# Patient Record
Sex: Male | Born: 1940 | Race: White | Hispanic: No | Marital: Married | Smoking: Former smoker
Health system: Southern US, Community
[De-identification: ages and names within clinical notes are randomized; demographics above are authoritative.]

## PROBLEM LIST (undated history)

## (undated) DIAGNOSIS — M7130 Other bursal cyst, unspecified site: Secondary | ICD-10-CM

## (undated) DIAGNOSIS — E039 Hypothyroidism, unspecified: Secondary | ICD-10-CM

## (undated) DIAGNOSIS — I493 Ventricular premature depolarization: Secondary | ICD-10-CM

## (undated) DIAGNOSIS — C801 Malignant (primary) neoplasm, unspecified: Secondary | ICD-10-CM

## (undated) DIAGNOSIS — J189 Pneumonia, unspecified organism: Secondary | ICD-10-CM

## (undated) DIAGNOSIS — E7800 Pure hypercholesterolemia, unspecified: Secondary | ICD-10-CM

## (undated) DIAGNOSIS — I10 Essential (primary) hypertension: Secondary | ICD-10-CM

## (undated) DIAGNOSIS — M65339 Trigger finger, unspecified middle finger: Secondary | ICD-10-CM

## (undated) DIAGNOSIS — M1990 Unspecified osteoarthritis, unspecified site: Secondary | ICD-10-CM

## (undated) HISTORY — PX: JOINT REPLACEMENT: SHX530

## (undated) HISTORY — PX: TONSILLECTOMY: SUR1361

## (undated) HISTORY — PX: OTHER SURGICAL HISTORY: SHX169

## (undated) HISTORY — DX: Hypothyroidism, unspecified: E03.9

## (undated) HISTORY — DX: Ventricular premature depolarization: I49.3

## (undated) HISTORY — DX: Essential (primary) hypertension: I10

## (undated) HISTORY — PX: MENISCUS REPAIR: SHX5179

## (undated) HISTORY — DX: Pure hypercholesterolemia, unspecified: E78.00

## (undated) HISTORY — PX: CARPAL TUNNEL RELEASE: SHX101

## (undated) HISTORY — PX: HERNIA REPAIR: SHX51

---

## 2000-10-20 ENCOUNTER — Other Ambulatory Visit: Admission: RE | Admit: 2000-10-20 | Discharge: 2000-10-20 | Payer: Self-pay | Admitting: Gastroenterology

## 2001-04-02 ENCOUNTER — Ambulatory Visit (HOSPITAL_COMMUNITY): Admission: RE | Admit: 2001-04-02 | Discharge: 2001-04-02 | Payer: Self-pay | Admitting: Gastroenterology

## 2004-03-31 ENCOUNTER — Emergency Department (HOSPITAL_COMMUNITY): Admission: EM | Admit: 2004-03-31 | Discharge: 2004-03-31 | Payer: Self-pay | Admitting: Family Medicine

## 2006-05-06 ENCOUNTER — Encounter (INDEPENDENT_AMBULATORY_CARE_PROVIDER_SITE_OTHER): Payer: Self-pay | Admitting: Specialist

## 2006-05-06 ENCOUNTER — Ambulatory Visit (HOSPITAL_COMMUNITY): Admission: RE | Admit: 2006-05-06 | Discharge: 2006-05-06 | Payer: Self-pay | Admitting: Gastroenterology

## 2007-08-04 ENCOUNTER — Encounter: Admission: RE | Admit: 2007-08-04 | Discharge: 2007-08-04 | Payer: Self-pay | Admitting: Cardiology

## 2010-03-06 ENCOUNTER — Ambulatory Visit: Payer: Self-pay | Admitting: Cardiology

## 2010-03-07 ENCOUNTER — Ambulatory Visit: Payer: Self-pay | Admitting: Cardiology

## 2010-05-19 ENCOUNTER — Encounter: Payer: Self-pay | Admitting: Cardiology

## 2010-05-19 DIAGNOSIS — I493 Ventricular premature depolarization: Secondary | ICD-10-CM | POA: Insufficient documentation

## 2010-05-19 DIAGNOSIS — I10 Essential (primary) hypertension: Secondary | ICD-10-CM | POA: Insufficient documentation

## 2010-05-19 DIAGNOSIS — E039 Hypothyroidism, unspecified: Secondary | ICD-10-CM | POA: Insufficient documentation

## 2010-05-19 DIAGNOSIS — E7800 Pure hypercholesterolemia, unspecified: Secondary | ICD-10-CM | POA: Insufficient documentation

## 2010-07-04 ENCOUNTER — Other Ambulatory Visit: Payer: Self-pay | Admitting: *Deleted

## 2010-07-05 ENCOUNTER — Ambulatory Visit (INDEPENDENT_AMBULATORY_CARE_PROVIDER_SITE_OTHER): Payer: Medicare Other | Admitting: *Deleted

## 2010-07-05 ENCOUNTER — Other Ambulatory Visit: Payer: Self-pay | Admitting: Cardiology

## 2010-07-05 DIAGNOSIS — E7800 Pure hypercholesterolemia, unspecified: Secondary | ICD-10-CM

## 2010-07-05 DIAGNOSIS — Z79899 Other long term (current) drug therapy: Secondary | ICD-10-CM

## 2010-07-06 ENCOUNTER — Encounter: Payer: Self-pay | Admitting: Cardiology

## 2010-07-06 ENCOUNTER — Ambulatory Visit (INDEPENDENT_AMBULATORY_CARE_PROVIDER_SITE_OTHER): Payer: Medicare Other | Admitting: Cardiology

## 2010-07-06 DIAGNOSIS — I10 Essential (primary) hypertension: Secondary | ICD-10-CM

## 2010-07-06 DIAGNOSIS — E7800 Pure hypercholesterolemia, unspecified: Secondary | ICD-10-CM

## 2010-07-06 DIAGNOSIS — I119 Hypertensive heart disease without heart failure: Secondary | ICD-10-CM

## 2010-07-06 DIAGNOSIS — Z79899 Other long term (current) drug therapy: Secondary | ICD-10-CM

## 2010-07-06 LAB — COMPREHENSIVE METABOLIC PANEL
Albumin: 3.8 g/dL (ref 3.5–5.2)
CO2: 27 mEq/L (ref 19–32)
Glucose, Bld: 105 mg/dL — ABNORMAL HIGH (ref 70–99)
Potassium: 4.2 mEq/L (ref 3.5–5.3)
Sodium: 137 mEq/L (ref 135–145)
Total Protein: 6.2 g/dL (ref 6.0–8.3)

## 2010-07-06 LAB — LIPID PANEL
Cholesterol: 180 mg/dL (ref 0–200)
LDL Cholesterol: 83 mg/dL (ref 0–99)
Triglycerides: 104 mg/dL (ref <150)

## 2010-07-06 NOTE — Assessment & Plan Note (Signed)
His blood pressure is remaining normal on current therapy.  Continue prudent diet and continue regular exercise.  Recheck in 4 months for followup office visit lipid panel and chemistries

## 2010-07-06 NOTE — Assessment & Plan Note (Signed)
We reviewed his blood work from yesterday which is improved even though his weight has gone up 4 pounds.  He is to continue same dose of Vytorin in work harder on weight loss.

## 2010-07-06 NOTE — Progress Notes (Signed)
HPI: This pleasant 70 year old gentleman is seen for a four-month followup office visit.  He has a history of essential hypertension and hypercholesterolemia.  [facility] also [facility]  He has felt well since last visit.  Denies chest pain or shortness of breath.  He does not have any palpitations as long as he avoids drinking too much caffeine.  He goes to the gym twice a week and works out with a Psychologist, educational for exercise.  Current Outpatient Prescriptions  Medication Sig Dispense Refill  . ALPRAZolam (XANAX) 0.5 MG tablet Take 0.5 mg by mouth as needed.        Marland Kitchen aspirin 81 MG tablet Take 81 mg by mouth daily.        Marland Kitchen atenolol (TENORMIN) 50 MG tablet Take 50 mg by mouth daily.        . Coenzyme Q10 (COQ10 PO) Take by mouth daily.        Marland Kitchen ezetimibe-simvastatin (VYTORIN) 10-40 MG per tablet Take 1 tablet by mouth at bedtime.        . fish oil-omega-3 fatty acids 1000 MG capsule Take 2 g by mouth daily. Taking liquid 1 tsp daily      . levothyroxine (SYNTHROID, LEVOTHROID) 150 MCG tablet Take 150 mcg by mouth daily.          Allergies  Allergen Reactions  . Epinephrine     Patient Active Problem List  Diagnoses  . HTN (hypertension)  . Hypercholesteremia  . Hypothyroidism  . PVC's (premature ventricular contractions)    History  Smoking status  . Former Smoker  Smokeless tobacco  . Not on file    History  Alcohol Use: Not on file    No family history on file.  Review of Systems: The patient denies any heat or cold intolerance.  No weight gain or weight loss.  The patient denies headaches or blurry vision.  There is no cough or sputum production.  The patient denies dizziness.  There is no hematuria or hematochezia.  The patient denies any muscle aches or arthritis.  The patient denies any rash.  The patient denies frequent falling or instability.  There is no history of depression or anxiety.  All other systems were reviewed and are  negative.   Physical Exam:  Vital signs as recorded.  The general appearance feels a well-developed well-nourished gentleman in no distress.Pupils equal and reactive.   Extraocular Movements are full.  There is no scleral icterus.  The mouth and pharynx are normal.  The neck is supple.  The carotids reveal no bruits.  The jugular venous pressure is normal.  The thyroid is not enlarged.  There is no lymphadenopathy.The chest is clear to percussion and auscultation. There are no rales or rhonchi. Expansion of the chest is symmetrical.The precordium is quiet.  The first heart sound is normal.  The second heart sound is physiologically split.  There is no murmur gallop rub or click.  There is no abnormal lift or heave. The rhythm is regular.The abdomen is soft and nontender. Bowel sounds are normal. The liver and spleen are not enlarged. There Are no abdominal masses. There are no bruits.The pedal pulses are good.  There is no phlebitis or edema.  There is no cyanosis or clubbing.Strength is normal and symmetrical in all extremities.  There is no lateralizing weakness.  There are no sensory deficits.  Assessment / Plan:

## 2010-08-31 NOTE — Op Note (Signed)
[facility]  Patient:    Carlos Juarez, Carlos Juarez Visit Number: [id number] MRN: [id number]          Service Type: END Location: ENDO Attending Physician:  Nelda Marseille Dictated by:   Petra Kuba, M.D. Proc. Date: 04/02/01 Admit Date:  04/02/2001 Discharge Date: 04/02/2001   CC:         Petra Kuba, M.D.  Lorenda Hatchet, M.D.   Operative Report  PROCEDURE:  Colonoscopy.  INDICATION:  This is a patient with a polyp on flexible sigmoidoscopy. Consent was signed after risk, benefits, method, and options were thoroughly discussed in the office.  MEDICINES USED:  Demerol 80 mg, Versed 8 mg.  DESCRIPTION OF PROCEDURE:  Rectal inspection was pertinent for external hemorrhoids. Digital exam was negative. The videocolonoscope was inserted and easily advanced around the colon to the cecum. This did require some abdominal pressure but no position changes. Other than an occasional left side diverticulum, no abnormalities were seen on insertion. The cecum was identified by the appendiceal orifice and the ileocecal valve and the scope was inserted shortways in the terminal ileum which was normal. Photo documentation was obtained. The scope was slowly withdrawn. Prep was adequate. We did need to wash and suction with about a liter and a half to get adequate visualization but on slow withdrawal back to the rectum, other than a sigmoid diverticulum, no abnormalities were seen. Once back in the rectum, the scope was retroflexed and pertinent for some internal hemorrhoids. We went ahead and readvanced to 40 cm and rewithdrew one more time, and again no polypoid lesion was seen. A good look was had at the sigmoid, particularly at the 25-cm area where the previous polyp was seen. The scope was reinserted shortways up the left side of the colon. Air was suctioned and scope was removed. The patient tolerated the procedure well. There was no obvious immediate  complication.  ENDOSCOPIC DIAGNOSES: 1. Internal/external hemorrhoids. 2. Left occasional sigmoid diverticula. 3. Otherwise within normal limits to the terminal ileum without signs of    polyps seen on flexible sigmoidoscopy, probably removed with the cold    biopsies at that time.  PLAN:  Yearly rectals and guaiacs per Dr. Modena Jansky. I will happy to see him back p.r.n., otherwise I recommend repeat colonoscopy in five years unless needed sooner p.r.n. Dictated by:   Petra Kuba, M.D. Attending Physician:  Nelda Marseille DD:  04/02/01 TD:  04/04/01 Job: [id number]

## 2010-08-31 NOTE — Op Note (Signed)
NAME:  Carlos Juarez, Carlos Juarez NO.:  [id number]   MEDICAL RECORD NO.:  [id number]          PATIENT TYPE:  AMB   LOCATION:  ENDO                         FACILITY:  [facility]   PHYSICIAN:  Petra Kuba, M.D.    DATE OF BIRTH:  [birth year]   DATE OF PROCEDURE:  05/06/2006  DATE OF DISCHARGE:                               OPERATIVE REPORT   PROCEDURE:  Colonoscopy.   INDICATION:  he patient with a personal history of colon polyps, due for  repeat screening.  Consent was signed after risks, benefits, methods,  options thoroughly discussed in the office on multiple occasions.   MEDICINES USED:  Fentanyl 100 mcg, Versed 8 mg.   PROCEDURE:  Rectal inspection is pertinent for external hemorrhoids,  small.  The genital exam was negative.  The video colonoscope was  inserted and easily advanced around the colon to the cecum.  This did  not require any abdominal pressure or any position changes.  No  abnormalities were seen on insertion.  Cecum was identified by the  appendiceal orifice and the ileocecal valve.  In fact, the scope was  inserted a short way into the terminal ileum, which was normal.  Photo  documentation is obtained.  The scope was slowly withdrawn.  Prep was  adequate.  There was some liquid stool that required washing and  suction.  On slow withdrawal through the colon, the cecum, ascending,  transverse, and descending were all normal.  When the scope was  withdrawn around the left side of the colon, I did see one diverticula  which was small.  Also in the distal sigmoid, a few hyperplastic  appearing polyps were seen, two of which were cold biopsied.  No other  abnormalities were seen as we slowly withdrew back to the rectum.  Made  a pull-through and retroflexion confirmed some small hemorrhoids.  The  scope was __________ short ways up the left side of the colon.  Air was  suctioned and scope removed.  The patient tolerated the procedure well.  There was no  obvious immediate complication.   ENDOSCOPIC DIAGNOSES:  1. Internal external small hemorrhoids.  2. One sigmoid diverticula.  3. Tiny hyperplastic appearing sigmoid polyp, cold biopsy.  4. Otherwise within normal limits to the terminal ileum.   PLAN:  Await pathology.  Probably recheck colon screening in 5 years.  Happy to see back p.r.n.  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  I offered him an endoscopy to see  if there were any at risk lesions, but he prefers to hold off, but happy  to reevaluate at some point in the future and leave it to him and Dr.  Dorothe Pea the risks and benefits of aspirin use.           ______________________________  Petra Kuba, M.D.    MEM/MEDQ  D:  05/06/2006  T:  05/06/2006  Job:  [id number]   cc:   Jethro Bastos, M.D.

## 2010-09-24 ENCOUNTER — Other Ambulatory Visit: Payer: Self-pay | Admitting: Cardiology

## 2010-09-24 DIAGNOSIS — E785 Hyperlipidemia, unspecified: Secondary | ICD-10-CM

## 2010-09-24 MED ORDER — EZETIMIBE-SIMVASTATIN 10-40 MG PO TABS: 1.0000 | ORAL_TABLET | Freq: Every day | ORAL | Status: DC

## 2010-09-24 NOTE — Telephone Encounter (Signed)
Called in needing a 90 day refill of Vitorin at [facility] [phone]. Please call back.

## 2010-09-24 NOTE — Telephone Encounter (Signed)
Refilled per patient request. 

## 2010-10-24 ENCOUNTER — Encounter: Payer: Self-pay | Admitting: Cardiology

## 2010-10-25 ENCOUNTER — Encounter: Payer: Self-pay | Admitting: Cardiology

## 2010-11-21 ENCOUNTER — Ambulatory Visit (INDEPENDENT_AMBULATORY_CARE_PROVIDER_SITE_OTHER): Payer: Medicare Other | Admitting: *Deleted

## 2010-11-21 ENCOUNTER — Other Ambulatory Visit: Payer: Medicare Other | Admitting: *Deleted

## 2010-11-21 DIAGNOSIS — I10 Essential (primary) hypertension: Secondary | ICD-10-CM

## 2010-11-21 DIAGNOSIS — E7800 Pure hypercholesterolemia, unspecified: Secondary | ICD-10-CM

## 2010-11-21 DIAGNOSIS — I493 Ventricular premature depolarization: Secondary | ICD-10-CM

## 2010-11-21 DIAGNOSIS — E039 Hypothyroidism, unspecified: Secondary | ICD-10-CM

## 2010-11-21 DIAGNOSIS — I4949 Other premature depolarization: Secondary | ICD-10-CM

## 2010-11-21 LAB — LIPID PANEL
HDL: 82.2 mg/dL (ref >39.00)
Total CHOL/HDL Ratio: 2
Triglycerides: 113 mg/dL (ref 0.0–149.0)
VLDL: 22.6 mg/dL (ref 0.0–40.0)

## 2010-11-21 LAB — HEPATIC FUNCTION PANEL
ALT: 27 U/L (ref 0–53)
Albumin: 3.3 g/dL — ABNORMAL LOW (ref 3.5–5.2)
Total Bilirubin: 1.8 mg/dL — ABNORMAL HIGH (ref 0.3–1.2)

## 2010-11-21 LAB — BASIC METABOLIC PANEL
GFR: 71.2 mL/min (ref >60.00)
Glucose, Bld: 110 mg/dL — ABNORMAL HIGH (ref 70–99)
Potassium: 3.9 mEq/L (ref 3.5–5.1)
Sodium: 140 mEq/L (ref 135–145)

## 2010-11-23 ENCOUNTER — Ambulatory Visit (INDEPENDENT_AMBULATORY_CARE_PROVIDER_SITE_OTHER): Payer: Medicare Other | Admitting: Cardiology

## 2010-11-23 ENCOUNTER — Encounter: Payer: Self-pay | Admitting: Cardiology

## 2010-11-23 VITALS — BP 126/80 | HR 70 | Wt 213.0 lb

## 2010-11-23 DIAGNOSIS — E7800 Pure hypercholesterolemia, unspecified: Secondary | ICD-10-CM

## 2010-11-23 DIAGNOSIS — E039 Hypothyroidism, unspecified: Secondary | ICD-10-CM

## 2010-11-23 DIAGNOSIS — I119 Hypertensive heart disease without heart failure: Secondary | ICD-10-CM

## 2010-11-23 DIAGNOSIS — I10 Essential (primary) hypertension: Secondary | ICD-10-CM

## 2010-11-23 NOTE — Assessment & Plan Note (Signed)
The patient is not having symptoms referable to his blood pressure.  No dizziness or headaches.  No syncope.

## 2010-11-23 NOTE — Assessment & Plan Note (Signed)
Despite his weight gain his lipids remain satisfactory on current therapy

## 2010-11-23 NOTE — Assessment & Plan Note (Signed)
Normally he gets his thyroid checked at the [facility] hospital but his [place] doctor has moved away and so he is asking that we check his thyroid at his next visit and this will be fine.  Clinically he is euthyroid.

## 2010-11-23 NOTE — Progress Notes (Signed)
Carlos Juarez Date of Birth:  [birth year] [facility] [address]   [address] [place], [place]  [address] [phone]           Fax   [phone]  History of Present Illness: This pleasant 70 year old gentleman is seen for a four-month followup office visit.  He has a past history of essential hypertension and hypercholesterolemia.  He also has hypothyroidism.  Since last visit he has felt well.  He has not been aware of any chest pain or shortness of breath.  He has a past history of PVCs which disappeared after he caffeine drinks.  He has not been as careful with his diet and he gained 3 pounds since last visit.  He has not been able to exercise as much because of a bad sprain of his right ankle [facility]  Current Outpatient Prescriptions  Medication Sig Dispense Refill  . ALPRAZolam (XANAX) 0.5 MG tablet Take 0.5 mg by mouth as needed.        Marland Kitchen aspirin 81 MG tablet Take 81 mg by mouth daily.        Marland Kitchen atenolol (TENORMIN) 50 MG tablet Take 50 mg by mouth daily.        . Coenzyme Q10 (COQ10 PO) Take by mouth daily.        Marland Kitchen ezetimibe-simvastatin (VYTORIN) 10-40 MG per tablet Take 1 tablet by mouth at bedtime.  90 tablet  3  . fish oil-omega-3 fatty acids 1000 MG capsule Take 2 g by mouth daily. Taking liquid 1 tsp daily      . levothyroxine (SYNTHROID, LEVOTHROID) 150 MCG tablet Take 150 mcg by mouth daily.          Allergies  Allergen Reactions  . Epinephrine     Patient Active Problem List  Diagnoses  . HTN (hypertension)  . Hypercholesteremia  . Hypothyroidism  . PVC's (premature ventricular contractions)    History  Smoking status  . Former Smoker  Smokeless tobacco  . Not on file    History  Alcohol Use: Not on file    No family history on file.  Review of Systems: Constitutional: no fever chills diaphoresis or fatigue or change in weight.  Head and neck: no hearing loss, no epistaxis, no photophobia  or visual disturbance. Respiratory: No cough, shortness of breath or wheezing. Cardiovascular: No chest pain peripheral edema, palpitations. Gastrointestinal: No abdominal distention, no abdominal pain, no change in bowel habits hematochezia or melena. Genitourinary: No dysuria, no frequency, no urgency, no nocturia. Musculoskeletal:No arthralgias, no back pain, no gait disturbance or myalgias. Neurological: No dizziness, no headaches, no numbness, no seizures, no syncope, no weakness, no tremors. Hematologic: No lymphadenopathy, no easy bruising. Psychiatric: No confusion, no hallucinations, no sleep disturbance.    Physical Exam: Filed Vitals:   11/23/10 1411  BP: 126/80  Pulse: 70  The general appearance reveals a well-developed well-nourished gentleman in no distress.The head and neck exam reveals pupils equal and reactive.  Extraocular movements are full.  There is no scleral icterus.  The mouth and pharynx are normal.  The neck is supple.  The carotids reveal no bruits.  The jugular venous pressure is normal.  The  thyroid is not enlarged.  There is no lymphadenopathy.  The chest is clear to percussion and auscultation.  There are no rales or rhonchi.  Expansion of the chest is symmetrical.  The precordium is quiet.  The first heart sound is normal.  The second heart sound is physiologically split.  There is no murmur gallop rub or click.  There is no abnormal lift or heave.  The abdomen is soft and nontender.  The bowel sounds are normal.  The liver and spleen are not enlarged.  There are no abdominal masses.  There are no abdominal bruits.  Extremities reveal good pedal pulses.The right ankle is slightly tender and slightly swollen.  There is no phlebitis or edema.  There is no cyanosis or clubbing.  Strength is normal and symmetrical in all extremities.  There is no lateralizing weakness.  There are no sensory deficits.  The skin is warm and dry.  There is no rash.     Assessment /  Plan: Work harder on weight loss and cut back further on portion size he has been eating chia seeds in his yogurt each day to help his cholesterol and he feels like it is helping.  Recheck in 4 months for a followup office visit and lab work

## 2011-03-26 ENCOUNTER — Other Ambulatory Visit (INDEPENDENT_AMBULATORY_CARE_PROVIDER_SITE_OTHER): Payer: Medicare Other | Admitting: *Deleted

## 2011-03-26 DIAGNOSIS — I119 Hypertensive heart disease without heart failure: Secondary | ICD-10-CM

## 2011-03-26 LAB — BASIC METABOLIC PANEL
Calcium: 8.5 mg/dL (ref 8.4–10.5)
GFR: 66.2 mL/min (ref >60.00)
Potassium: 3.8 mEq/L (ref 3.5–5.1)
Sodium: 138 mEq/L (ref 135–145)

## 2011-03-26 LAB — HEPATIC FUNCTION PANEL
ALT: 26 U/L (ref 0–53)
AST: 25 U/L (ref 0–37)
Albumin: 3.4 g/dL — ABNORMAL LOW (ref 3.5–5.2)
Total Bilirubin: 1.6 mg/dL — ABNORMAL HIGH (ref 0.3–1.2)
Total Protein: 6.1 g/dL (ref 6.0–8.3)

## 2011-03-26 LAB — LIPID PANEL
HDL: 74.3 mg/dL (ref >39.00)
Triglycerides: 90 mg/dL (ref 0.0–149.0)
VLDL: 18 mg/dL (ref 0.0–40.0)

## 2011-03-29 ENCOUNTER — Ambulatory Visit (INDEPENDENT_AMBULATORY_CARE_PROVIDER_SITE_OTHER): Payer: Medicare Other | Admitting: Cardiology

## 2011-03-29 ENCOUNTER — Encounter: Payer: Self-pay | Admitting: Cardiology

## 2011-03-29 VITALS — BP 118/74 | HR 60 | Ht 72.0 in | Wt 216.0 lb

## 2011-03-29 DIAGNOSIS — E039 Hypothyroidism, unspecified: Secondary | ICD-10-CM

## 2011-03-29 DIAGNOSIS — I119 Hypertensive heart disease without heart failure: Secondary | ICD-10-CM

## 2011-03-29 DIAGNOSIS — E7800 Pure hypercholesterolemia, unspecified: Secondary | ICD-10-CM

## 2011-03-29 DIAGNOSIS — I10 Essential (primary) hypertension: Secondary | ICD-10-CM

## 2011-03-29 LAB — LIPID PANEL
Cholesterol: 173 mg/dL (ref 0–200)
HDL: 81 mg/dL
LDL Cholesterol: 78 mg/dL (ref 0–99)
Total CHOL/HDL Ratio: 2
Triglycerides: 71 mg/dL (ref 0.0–149.0)
VLDL: 14.2 mg/dL (ref 0.0–40.0)

## 2011-03-29 LAB — BASIC METABOLIC PANEL WITH GFR
BUN: 19 mg/dL (ref 6–23)
CO2: 28 meq/L (ref 19–32)
Calcium: 8.9 mg/dL (ref 8.4–10.5)
Chloride: 105 meq/L (ref 96–112)
Creatinine, Ser: 1.2 mg/dL (ref 0.4–1.5)
GFR: 63.05 mL/min
Glucose, Bld: 103 mg/dL — ABNORMAL HIGH (ref 70–99)
Potassium: 4.5 meq/L (ref 3.5–5.1)
Sodium: 141 meq/L (ref 135–145)

## 2011-03-29 LAB — HEPATIC FUNCTION PANEL
ALT: 29 U/L (ref 0–53)
AST: 27 U/L (ref 0–37)
Total Protein: 6.4 g/dL (ref 6.0–8.3)

## 2011-03-29 LAB — T4, FREE: Free T4: 1.36 ng/dL (ref 0.60–1.60)

## 2011-03-29 LAB — TSH: TSH: 0.67 u[IU]/mL (ref 0.35–5.50)

## 2011-03-29 MED ORDER — ALPRAZOLAM 0.5 MG PO TABS: 0.5000 mg | ORAL_TABLET | ORAL | Status: DC | PRN

## 2011-03-29 NOTE — Assessment & Plan Note (Signed)
The patient is clinically euthyroid and we are checking TSH and free T4 today.

## 2011-03-29 NOTE — Assessment & Plan Note (Signed)
The patient is on Vytorin 10/40.  He's not having any myalgias from the statin therapy.

## 2011-03-29 NOTE — Progress Notes (Signed)
Carlos Juarez Date of Birth:  [birth year] [facility] / [facility] [address]   [address] [place], [place]  [address] [phone]           Fax   [phone]  HPI: This pleasant 70 year old gentleman is seen for a scheduled followup office visit.  He has a past history of benign PVCs and a history of essential hypertension hypothyroidism and hypercholesterolemia.  He had recent lab work which was satisfactory.  We're sending him back to the lab today for thyroid function studies.  Since last visit he's had no chest pain or shortness of breath.  He has not been aware of any PVCs.  He does try to work out with a trainer twice a week.  He has some difficulty sleeping and uses Xanax 0.5 mg at bedtime occasionally  Current Outpatient Prescriptions  Medication Sig Dispense Refill  . ALPRAZolam (XANAX) 0.5 MG tablet Take 1 tablet (0.5 mg total) by mouth as needed for anxiety.  30 tablet  5  . aspirin 81 MG tablet Take 81 mg by mouth daily.        Marland Kitchen atenolol (TENORMIN) 50 MG tablet Take 50 mg by mouth daily.        . Coenzyme Q10 (COQ10 PO) Take by mouth daily.        Marland Kitchen ezetimibe-simvastatin (VYTORIN) 10-40 MG per tablet Take 1 tablet by mouth at bedtime.  90 tablet  3  . fish oil-omega-3 fatty acids 1000 MG capsule Take 2 g by mouth daily. Taking liquid 1 tsp daily      . levothyroxine (SYNTHROID, LEVOTHROID) 150 MCG tablet Take 150 mcg by mouth daily.          Allergies  Allergen Reactions  . Epinephrine     Patient Active Problem List  Diagnoses  . HTN (hypertension)  . Hypercholesteremia  . Hypothyroidism  . PVC's (premature ventricular contractions)    History  Smoking status  . Former Smoker  Smokeless tobacco  . Not on file    History  Alcohol Use: Not on file    No family history on file.  Review of Systems: The patient denies any heat or cold intolerance.  No weight gain or weight loss.  The patient denies headaches or blurry vision.  There is  no cough or sputum production.  The patient denies dizziness.  There is no hematuria or hematochezia.  The patient denies any muscle aches or arthritis.  The patient denies any rash.  The patient denies frequent falling or instability.  There is no history of depression or anxiety.  All other systems were reviewed and are negative.   Physical Exam: Filed Vitals:   03/29/11 0949  BP: 118/74  Pulse: 60   general appearance feels a well-developed well-nourished gentleman in no distress.  He has gained 3 pounds since last visit.The head and neck exam reveals pupils equal and reactive.  Extraocular movements are full.  There is no scleral icterus.  The mouth and pharynx are normal.  The neck is supple.  The carotids reveal no bruits.  The jugular venous pressure is normal.  The  thyroid is not enlarged.  There is no lymphadenopathy.  The chest is clear to percussion and auscultation.  There are no rales or rhonchi.  Expansion of the chest is symmetrical.  The precordium is quiet.  The first heart sound is normal.  The second heart sound is physiologically split.  There is no murmur gallop rub or  click.  There is no abnormal lift or heave.  The abdomen is soft and nontender.  The bowel sounds are normal.  The liver and spleen are not enlarged.  There are no abdominal masses.  There are no abdominal bruits.  Extremities reveal good pedal pulses.  There is no phlebitis or edema.  There is no cyanosis or clubbing.  Strength is normal and symmetrical in all extremities.  There is no lateralizing weakness.  There are no sensory deficits.  The skin is warm and dry.  There is no rash.     Assessment / Plan:  Continue same medication.  Await results of thyroid function studies  drawn today.  Continue same medication and recheck in 4 months for followup office visit lipid panel hepatic function panel and basal metabolic panel

## 2011-03-29 NOTE — Assessment & Plan Note (Signed)
The patient denies any chest pain shortness of breath or headaches.  No dizzy spells or syncope.  Exercise tolerance is good.

## 2011-03-29 NOTE — Patient Instructions (Signed)
Your physician wants you to follow-up in: 4 months,You will receive a reminder letter in the mail two months in advance. If you don't receive a letter, please call our office to schedule the follow-up appointment.  Your physician recommends that you return for lab work in: TODAY and in 4 months FASTING

## 2011-04-02 ENCOUNTER — Telehealth: Payer: Self-pay | Admitting: *Deleted

## 2011-04-02 NOTE — Telephone Encounter (Signed)
Message copied by Burnell Blanks on Tue Apr 02, 2011  5:40 PM ------      Message from: Cassell Clement      Created: Fri Mar 29, 2011  7:52 PM       Thyroid function normal. Send copy to patient.

## 2011-04-02 NOTE — Telephone Encounter (Signed)
Mailed patient copy of labs

## 2011-04-03 ENCOUNTER — Other Ambulatory Visit: Payer: Self-pay | Admitting: Otolaryngology

## 2011-04-03 ENCOUNTER — Ambulatory Visit
Admission: RE | Admit: 2011-04-03 | Discharge: 2011-04-03 | Disposition: A | Discharge status: Home / Self Care | Payer: Medicare Other | Source: Ambulatory Visit | Attending: Otolaryngology | Admitting: Otolaryngology

## 2011-04-03 DIAGNOSIS — R05 Cough: Secondary | ICD-10-CM

## 2011-04-03 DIAGNOSIS — R509 Fever, unspecified: Secondary | ICD-10-CM

## 2011-05-16 DIAGNOSIS — L821 Other seborrheic keratosis: Secondary | ICD-10-CM | POA: Diagnosis not present

## 2011-05-16 DIAGNOSIS — L723 Sebaceous cyst: Secondary | ICD-10-CM | POA: Diagnosis not present

## 2011-05-16 DIAGNOSIS — Z85828 Personal history of other malignant neoplasm of skin: Secondary | ICD-10-CM | POA: Diagnosis not present

## 2011-05-22 ENCOUNTER — Other Ambulatory Visit: Payer: Self-pay | Admitting: Gastroenterology

## 2011-05-22 ENCOUNTER — Encounter: Payer: Self-pay | Admitting: Cardiology

## 2011-05-22 DIAGNOSIS — Z86010 Personal history of colonic polyps: Secondary | ICD-10-CM | POA: Diagnosis not present

## 2011-05-22 DIAGNOSIS — D131 Benign neoplasm of stomach: Secondary | ICD-10-CM | POA: Diagnosis not present

## 2011-05-22 DIAGNOSIS — K449 Diaphragmatic hernia without obstruction or gangrene: Secondary | ICD-10-CM | POA: Diagnosis not present

## 2011-05-22 DIAGNOSIS — D126 Benign neoplasm of colon, unspecified: Secondary | ICD-10-CM | POA: Diagnosis not present

## 2011-05-22 DIAGNOSIS — Z09 Encounter for follow-up examination after completed treatment for conditions other than malignant neoplasm: Secondary | ICD-10-CM | POA: Diagnosis not present

## 2011-05-22 DIAGNOSIS — K5730 Diverticulosis of large intestine without perforation or abscess without bleeding: Secondary | ICD-10-CM | POA: Diagnosis not present

## 2011-05-24 ENCOUNTER — Telehealth: Payer: Self-pay | Admitting: Cardiology

## 2011-05-24 DIAGNOSIS — E785 Hyperlipidemia, unspecified: Secondary | ICD-10-CM

## 2011-05-24 NOTE — Telephone Encounter (Signed)
New problem:  Need an order to be place for fasting lab

## 2011-05-24 NOTE — Telephone Encounter (Signed)
Orders put in system.

## 2011-06-05 ENCOUNTER — Other Ambulatory Visit: Payer: Self-pay | Admitting: Dermatology

## 2011-06-05 DIAGNOSIS — L723 Sebaceous cyst: Secondary | ICD-10-CM | POA: Diagnosis not present

## 2011-08-14 ENCOUNTER — Other Ambulatory Visit (INDEPENDENT_AMBULATORY_CARE_PROVIDER_SITE_OTHER): Payer: Medicare Other

## 2011-08-14 DIAGNOSIS — E785 Hyperlipidemia, unspecified: Secondary | ICD-10-CM | POA: Diagnosis not present

## 2011-08-14 LAB — BASIC METABOLIC PANEL
Calcium: 8.8 mg/dL (ref 8.4–10.5)
GFR: 61.8 mL/min (ref >60.00)
Sodium: 137 mEq/L (ref 135–145)

## 2011-08-14 LAB — LIPID PANEL
HDL: 92.5 mg/dL (ref >39.00)
Total CHOL/HDL Ratio: 2
VLDL: 22.4 mg/dL (ref 0.0–40.0)

## 2011-08-14 LAB — HEPATIC FUNCTION PANEL
Alkaline Phosphatase: 48 U/L (ref 39–117)
Bilirubin, Direct: 0.2 mg/dL (ref 0.0–0.3)
Total Bilirubin: 2 mg/dL — ABNORMAL HIGH (ref 0.3–1.2)

## 2011-08-14 NOTE — Progress Notes (Signed)
Quick Note:  Please make copy of labs for patient visit. ______ 

## 2011-08-20 ENCOUNTER — Encounter: Payer: Self-pay | Admitting: Cardiology

## 2011-08-20 ENCOUNTER — Ambulatory Visit (INDEPENDENT_AMBULATORY_CARE_PROVIDER_SITE_OTHER): Payer: Medicare Other | Admitting: Cardiology

## 2011-08-20 VITALS — BP 129/88 | HR 56 | Ht 72.0 in | Wt 213.8 lb

## 2011-08-20 DIAGNOSIS — E785 Hyperlipidemia, unspecified: Secondary | ICD-10-CM | POA: Diagnosis not present

## 2011-08-20 DIAGNOSIS — M47812 Spondylosis without myelopathy or radiculopathy, cervical region: Secondary | ICD-10-CM | POA: Insufficient documentation

## 2011-08-20 DIAGNOSIS — I119 Hypertensive heart disease without heart failure: Secondary | ICD-10-CM

## 2011-08-20 DIAGNOSIS — E039 Hypothyroidism, unspecified: Secondary | ICD-10-CM

## 2011-08-20 DIAGNOSIS — E7800 Pure hypercholesterolemia, unspecified: Secondary | ICD-10-CM

## 2011-08-20 DIAGNOSIS — I10 Essential (primary) hypertension: Secondary | ICD-10-CM | POA: Diagnosis not present

## 2011-08-20 MED ORDER — EZETIMIBE-SIMVASTATIN 10-40 MG PO TABS: 1.0000 | ORAL_TABLET | Freq: Every day | ORAL | Status: DC

## 2011-08-20 NOTE — Progress Notes (Signed)
Carlos Juarez Date of Birth:  1940-09-15 [facility] [address] [place], [place]  [address] [phone]  Fax   [phone]  HPI: This pleasant 71 year old gentleman is seen for a scheduled followup office visit.  He has a history of essential hypertension and a history of hypercholesterolemia.  He also has a history of hypothyroid disease.  His last visit he has been doing well.  He reports that around Christmas he had a bad case of influenza and saw Dr. Lazarus Salines and was treated with Tamiflu.  It took him several weeks to get his strength back.  He had a very poor appetite during that time and everything tasted salty to him.  Current Outpatient Prescriptions  Medication Sig Dispense Refill  . ALPRAZolam (XANAX) 0.5 MG tablet Take 1 tablet (0.5 mg total) by mouth as needed for anxiety.  30 tablet  5  . aspirin 81 MG tablet Take 81 mg by mouth daily.        Marland Kitchen atenolol (TENORMIN) 50 MG tablet Take 50 mg by mouth daily.        . Coenzyme Q10 (COQ10 PO) Take by mouth daily.        Marland Kitchen ezetimibe-simvastatin (VYTORIN) 10-40 MG per tablet Take 1 tablet by mouth at bedtime.  90 tablet  3  . fish oil-omega-3 fatty acids 1000 MG capsule Take 2 g by mouth daily. Taking liquid 1 tsp daily      . levothyroxine (SYNTHROID, LEVOTHROID) 150 MCG tablet Take 150 mcg by mouth daily.        Marland Kitchen DISCONTD: ezetimibe-simvastatin (VYTORIN) 10-40 MG per tablet Take 1 tablet by mouth at bedtime.  90 tablet  3    Allergies  Allergen Reactions  . Epinephrine     Patient Active Problem List  Diagnoses  . HTN (hypertension)  . Hypercholesteremia  . Hypothyroidism  . PVC's (premature ventricular contractions)    History  Smoking status  . Former Smoker  Smokeless tobacco  . Not on file    History  Alcohol Use: Not on file    No family history on file.  Review of Systems: The patient denies any heat or cold intolerance.  No weight gain or weight loss.  The patient denies  headaches or blurry vision.  There is no cough or sputum production.  The patient denies dizziness.  There is no hematuria or hematochezia.  The patient denies any muscle aches or arthritis.  The patient denies any rash.  The patient denies frequent falling or instability.  There is no history of depression or anxiety.  All other systems were reviewed and are negative.   Physical Exam: Filed Vitals:   08/20/11 1459  BP: 129/88  Pulse: 56   the general appearance reveals a well-developed well-nourished gentleman in no distress.The head and neck exam reveals pupils equal and reactive.  Extraocular movements are full.  There is no scleral icterus.  The mouth and pharynx are normal.  The neck is supple.  The carotids reveal no bruits.  The jugular venous pressure is normal.  The  thyroid is not enlarged.  There is no lymphadenopathy.  The chest is clear to percussion and auscultation.  There are no rales or rhonchi.  Expansion of the chest is symmetrical.  The precordium is quiet.  The first heart sound is normal.  The second heart sound is physiologically split.  There is no murmur gallop rub or click.  There is no abnormal lift or heave.  The abdomen is soft and nontender.  The bowel sounds are normal.  The liver and spleen are not enlarged.  There are no abdominal masses.  There are no abdominal bruits.  Extremities reveal good pedal pulses.  There is no phlebitis or edema.  There is no cyanosis or clubbing.  Strength is normal and symmetrical in all extremities.  There is no lateralizing weakness.  There are no sensory deficits.  The skin is warm and dry.  There is no rash.  EKG today shows sinus bradycardia and is otherwise within normal limits    Assessment / Plan: Continue same medication.  We refilled his Vytorin.  Recheck in 4 months for followup office visit and fasting lipid panel hepatic function panel and basal metabolic panel

## 2011-08-20 NOTE — Patient Instructions (Signed)
Your physician recommends that you continue on your current medications as directed. Please refer to the Current Medication list given to you today.  Your physician recommends that you schedule a follow-up appointment in: 4 months with fasting labs (lp/bmet/hfp)  

## 2011-08-20 NOTE — Assessment & Plan Note (Signed)
Patient has a history of hypertension.  He has not been having any chest pain or shortness of breath.  He exercises regularly with a trainer.  He has not been aware of any recent PVCs and he does avoid caffeine

## 2011-08-20 NOTE — Assessment & Plan Note (Signed)
Patient has a history of hypercholesterolemia.  We reviewed his recent labs which are still satisfactory although not as good as last time.  His blood sugar is also slightly elevated and he has not been as careful with his diet.  His weight however is down 3 pounds from his last visit probably reflecting his flulike illness several months ago when he lost a lot of weight

## 2011-08-20 NOTE — Assessment & Plan Note (Signed)
The patient awakens during the night with his right thumb and adjacent to fingers feeling asleep.  This never happens during the day.  It does not affect his left hand.  He feels that it is probably related to a pinched nerve in his neck.  His symptoms are not severe enough that he wants to see a neurosurgeon or a specialist at this point.  Plans also to discuss these symptoms with his chiropractor

## 2011-08-20 NOTE — Assessment & Plan Note (Signed)
Patient is clinically euthyroid on his current dose of Synthroid 150 mcg daily

## 2012-01-03 ENCOUNTER — Other Ambulatory Visit (INDEPENDENT_AMBULATORY_CARE_PROVIDER_SITE_OTHER): Payer: Medicare Other

## 2012-01-03 DIAGNOSIS — E7800 Pure hypercholesterolemia, unspecified: Secondary | ICD-10-CM | POA: Diagnosis not present

## 2012-01-03 LAB — BASIC METABOLIC PANEL
Calcium: 9.1 mg/dL (ref 8.4–10.5)
Chloride: 100 mEq/L (ref 96–112)
Creatinine, Ser: 1.2 mg/dL (ref 0.4–1.5)
GFR: 65.4 mL/min (ref >60.00)

## 2012-01-03 LAB — HEPATIC FUNCTION PANEL
ALT: 55 U/L — ABNORMAL HIGH (ref 0–53)
Albumin: 3.4 g/dL — ABNORMAL LOW (ref 3.5–5.2)
Alkaline Phosphatase: 50 U/L (ref 39–117)
Total Protein: 6.3 g/dL (ref 6.0–8.3)

## 2012-01-03 LAB — LIPID PANEL
Cholesterol: 206 mg/dL — ABNORMAL HIGH (ref 0–200)
VLDL: 26 mg/dL (ref 0.0–40.0)

## 2012-01-03 LAB — LDL CHOLESTEROL, DIRECT: Direct LDL: 103.7 mg/dL

## 2012-01-05 NOTE — Progress Notes (Signed)
Quick Note:  Please make copy of labs for patient visit. ______ 

## 2012-01-08 ENCOUNTER — Encounter: Payer: Self-pay | Admitting: Cardiology

## 2012-01-08 ENCOUNTER — Ambulatory Visit (INDEPENDENT_AMBULATORY_CARE_PROVIDER_SITE_OTHER): Payer: Medicare Other | Admitting: Cardiology

## 2012-01-08 VITALS — BP 124/80 | HR 62 | Ht 72.0 in | Wt 202.8 lb

## 2012-01-08 DIAGNOSIS — E7800 Pure hypercholesterolemia, unspecified: Secondary | ICD-10-CM

## 2012-01-08 DIAGNOSIS — E039 Hypothyroidism, unspecified: Secondary | ICD-10-CM

## 2012-01-08 DIAGNOSIS — I10 Essential (primary) hypertension: Secondary | ICD-10-CM | POA: Diagnosis not present

## 2012-01-08 NOTE — Progress Notes (Signed)
Carlos Juarez Date of Birth:  Aug 30, 1940 [facility] [address] [place], [place]  [address] [phone]  Fax   [phone]  HPI: This pleasant 71 year old gentleman is seen for a scheduled followup office visit. He has a history of essential hypertension and a history of hypercholesterolemia.  He does not have any history of ischemic heart disease.  He had a normal treadmill Cardiolite stress test on 07/24/05 which showed no ischemia and his ejection fraction was 57% with no wall motion abnormalities. He also has a history of hypothyroid disease.  Since his last visit he has been doing well.   Current Outpatient Prescriptions  Medication Sig Dispense Refill  . ALPRAZolam (XANAX) 0.5 MG tablet Take 1 tablet (0.5 mg total) by mouth as needed for anxiety.  30 tablet  5  . aspirin 81 MG tablet Take 81 mg by mouth daily.        Marland Kitchen atenolol (TENORMIN) 50 MG tablet Take 50 mg by mouth daily.        . Coenzyme Q10 (COQ10 PO) Take by mouth daily.        Marland Kitchen ezetimibe-simvastatin (VYTORIN) 10-40 MG per tablet Take 1 tablet by mouth at bedtime.  90 tablet  3  . fish oil-omega-3 fatty acids 1000 MG capsule Take 2 g by mouth daily. Taking liquid 1 tsp daily      . levothyroxine (SYNTHROID, LEVOTHROID) 150 MCG tablet Take 150 mcg by mouth daily.          Allergies  Allergen Reactions  . Epinephrine     Patient Active Problem List  Diagnosis  . HTN (hypertension)  . Hypercholesteremia  . Hypothyroidism  . PVC's (premature ventricular contractions)  . Cervical spondylosis    History  Smoking status  . Former Smoker  Smokeless tobacco  . Not on file    History  Alcohol Use: Not on file    No family history on file.  Review of Systems: The patient denies any heat or cold intolerance.  No weight gain or weight loss.  The patient denies headaches or blurry vision.  There is no cough or sputum production.  The patient denies dizziness.  There is no hematuria or  hematochezia.  The patient denies any muscle aches or arthritis.  The patient denies any rash.  The patient denies frequent falling or instability.  There is no history of depression or anxiety.  All other systems were reviewed and are negative.   Physical Exam: Filed Vitals:   01/08/12 1439  BP: 124/80  Pulse: 62   The general appearance reveals a well-developed well-nourished gentleman in no distress.The head and neck exam reveals pupils equal and reactive.  Extraocular movements are full.  There is no scleral icterus.  The mouth and pharynx are normal.  The neck is supple.  The carotids reveal no bruits.  The jugular venous pressure is normal.  The  thyroid is not enlarged.  There is no lymphadenopathy.  The chest is clear to percussion and auscultation.  There are no rales or rhonchi.  Expansion of the chest is symmetrical.  The precordium is quiet.  The first heart sound is normal.  The second heart sound is physiologically split.  There is no murmur gallop rub or click.  There is no abnormal lift or heave.  The abdomen is soft and nontender.  The bowel sounds are normal.  The liver and spleen are not enlarged.  There are no abdominal masses.  There are  no abdominal bruits.  Extremities reveal good pedal pulses.  There is no phlebitis or edema.  There is no cyanosis or clubbing.  Strength is normal and symmetrical in all extremities.  There is no lateralizing weakness.  There are no sensory deficits.  The skin is warm and dry.  There is no rash.     Assessment / Plan: Continue same medication but go back to his previous heart healthy diet.  Recheck in 4 months for followup office visit and we'll get fasting lab work as well as TSH and free T4 ahead of time

## 2012-01-08 NOTE — Patient Instructions (Addendum)
Your physician recommends that you continue on your current medications as directed. Please refer to the Current Medication list given to you today.  Your physician wants you to follow-up in: 4 months with fasting labs (lp/bmet/hfp/t4/tsh)  You will receive a reminder letter in the mail two months in advance. If you don't receive a letter, please call our office to schedule the follow-up appointment.

## 2012-01-08 NOTE — Assessment & Plan Note (Signed)
Blood pressure has been stable on current therapy.  He exercises regularly.  He has not been aware of any recent PVCs.  He drinks about one cup of coffee a day.  He drinks very little alcohol.

## 2012-01-08 NOTE — Assessment & Plan Note (Signed)
The patient has been on a new diet and has lost 11 pounds.  It is called the 2 day diet.  Unfortunately his lipid work and blood work drawn last week show a worsening of his lipid profile and also his blood sugar is higher.  It appears at this time it is not suitable for him and he will go back to his previous diet and use portion control.  Also one of his liver function studies is elevated this time possibly reflecting fatty liver.

## 2012-01-15 DIAGNOSIS — S139XXA Sprain of joints and ligaments of unspecified parts of neck, initial encounter: Secondary | ICD-10-CM | POA: Diagnosis not present

## 2012-01-15 DIAGNOSIS — M9981 Other biomechanical lesions of cervical region: Secondary | ICD-10-CM | POA: Diagnosis not present

## 2012-01-15 DIAGNOSIS — M999 Biomechanical lesion, unspecified: Secondary | ICD-10-CM | POA: Diagnosis not present

## 2012-01-15 DIAGNOSIS — S239XXA Sprain of unspecified parts of thorax, initial encounter: Secondary | ICD-10-CM | POA: Diagnosis not present

## 2012-01-15 DIAGNOSIS — S335XXA Sprain of ligaments of lumbar spine, initial encounter: Secondary | ICD-10-CM | POA: Diagnosis not present

## 2012-01-16 DIAGNOSIS — M999 Biomechanical lesion, unspecified: Secondary | ICD-10-CM | POA: Diagnosis not present

## 2012-01-16 DIAGNOSIS — M9981 Other biomechanical lesions of cervical region: Secondary | ICD-10-CM | POA: Diagnosis not present

## 2012-01-16 DIAGNOSIS — S239XXA Sprain of unspecified parts of thorax, initial encounter: Secondary | ICD-10-CM | POA: Diagnosis not present

## 2012-01-16 DIAGNOSIS — S335XXA Sprain of ligaments of lumbar spine, initial encounter: Secondary | ICD-10-CM | POA: Diagnosis not present

## 2012-01-16 DIAGNOSIS — S139XXA Sprain of joints and ligaments of unspecified parts of neck, initial encounter: Secondary | ICD-10-CM | POA: Diagnosis not present

## 2012-01-20 DIAGNOSIS — S139XXA Sprain of joints and ligaments of unspecified parts of neck, initial encounter: Secondary | ICD-10-CM | POA: Diagnosis not present

## 2012-01-20 DIAGNOSIS — S239XXA Sprain of unspecified parts of thorax, initial encounter: Secondary | ICD-10-CM | POA: Diagnosis not present

## 2012-01-20 DIAGNOSIS — M9981 Other biomechanical lesions of cervical region: Secondary | ICD-10-CM | POA: Diagnosis not present

## 2012-01-20 DIAGNOSIS — M999 Biomechanical lesion, unspecified: Secondary | ICD-10-CM | POA: Diagnosis not present

## 2012-01-20 DIAGNOSIS — S335XXA Sprain of ligaments of lumbar spine, initial encounter: Secondary | ICD-10-CM | POA: Diagnosis not present

## 2012-01-21 DIAGNOSIS — S239XXA Sprain of unspecified parts of thorax, initial encounter: Secondary | ICD-10-CM | POA: Diagnosis not present

## 2012-01-21 DIAGNOSIS — S335XXA Sprain of ligaments of lumbar spine, initial encounter: Secondary | ICD-10-CM | POA: Diagnosis not present

## 2012-01-21 DIAGNOSIS — S139XXA Sprain of joints and ligaments of unspecified parts of neck, initial encounter: Secondary | ICD-10-CM | POA: Diagnosis not present

## 2012-01-21 DIAGNOSIS — M9981 Other biomechanical lesions of cervical region: Secondary | ICD-10-CM | POA: Diagnosis not present

## 2012-01-21 DIAGNOSIS — M999 Biomechanical lesion, unspecified: Secondary | ICD-10-CM | POA: Diagnosis not present

## 2012-01-22 DIAGNOSIS — H43819 Vitreous degeneration, unspecified eye: Secondary | ICD-10-CM | POA: Diagnosis not present

## 2012-01-22 DIAGNOSIS — H259 Unspecified age-related cataract: Secondary | ICD-10-CM | POA: Diagnosis not present

## 2012-01-22 DIAGNOSIS — H524 Presbyopia: Secondary | ICD-10-CM | POA: Diagnosis not present

## 2012-01-22 DIAGNOSIS — H52209 Unspecified astigmatism, unspecified eye: Secondary | ICD-10-CM | POA: Diagnosis not present

## 2012-01-23 DIAGNOSIS — S239XXA Sprain of unspecified parts of thorax, initial encounter: Secondary | ICD-10-CM | POA: Diagnosis not present

## 2012-01-23 DIAGNOSIS — M999 Biomechanical lesion, unspecified: Secondary | ICD-10-CM | POA: Diagnosis not present

## 2012-01-23 DIAGNOSIS — S139XXA Sprain of joints and ligaments of unspecified parts of neck, initial encounter: Secondary | ICD-10-CM | POA: Diagnosis not present

## 2012-01-23 DIAGNOSIS — M9981 Other biomechanical lesions of cervical region: Secondary | ICD-10-CM | POA: Diagnosis not present

## 2012-01-23 DIAGNOSIS — S335XXA Sprain of ligaments of lumbar spine, initial encounter: Secondary | ICD-10-CM | POA: Diagnosis not present

## 2012-02-27 DIAGNOSIS — S335XXA Sprain of ligaments of lumbar spine, initial encounter: Secondary | ICD-10-CM | POA: Diagnosis not present

## 2012-02-27 DIAGNOSIS — M9981 Other biomechanical lesions of cervical region: Secondary | ICD-10-CM | POA: Diagnosis not present

## 2012-02-27 DIAGNOSIS — S239XXA Sprain of unspecified parts of thorax, initial encounter: Secondary | ICD-10-CM | POA: Diagnosis not present

## 2012-02-27 DIAGNOSIS — M999 Biomechanical lesion, unspecified: Secondary | ICD-10-CM | POA: Diagnosis not present

## 2012-02-27 DIAGNOSIS — S139XXA Sprain of joints and ligaments of unspecified parts of neck, initial encounter: Secondary | ICD-10-CM | POA: Diagnosis not present

## 2012-03-03 DIAGNOSIS — M9981 Other biomechanical lesions of cervical region: Secondary | ICD-10-CM | POA: Diagnosis not present

## 2012-03-03 DIAGNOSIS — S139XXA Sprain of joints and ligaments of unspecified parts of neck, initial encounter: Secondary | ICD-10-CM | POA: Diagnosis not present

## 2012-03-03 DIAGNOSIS — S239XXA Sprain of unspecified parts of thorax, initial encounter: Secondary | ICD-10-CM | POA: Diagnosis not present

## 2012-03-03 DIAGNOSIS — M999 Biomechanical lesion, unspecified: Secondary | ICD-10-CM | POA: Diagnosis not present

## 2012-03-03 DIAGNOSIS — S335XXA Sprain of ligaments of lumbar spine, initial encounter: Secondary | ICD-10-CM | POA: Diagnosis not present

## 2012-03-05 DIAGNOSIS — M9981 Other biomechanical lesions of cervical region: Secondary | ICD-10-CM | POA: Diagnosis not present

## 2012-03-05 DIAGNOSIS — S335XXA Sprain of ligaments of lumbar spine, initial encounter: Secondary | ICD-10-CM | POA: Diagnosis not present

## 2012-03-05 DIAGNOSIS — S139XXA Sprain of joints and ligaments of unspecified parts of neck, initial encounter: Secondary | ICD-10-CM | POA: Diagnosis not present

## 2012-03-05 DIAGNOSIS — S239XXA Sprain of unspecified parts of thorax, initial encounter: Secondary | ICD-10-CM | POA: Diagnosis not present

## 2012-03-05 DIAGNOSIS — M999 Biomechanical lesion, unspecified: Secondary | ICD-10-CM | POA: Diagnosis not present

## 2012-03-09 DIAGNOSIS — M999 Biomechanical lesion, unspecified: Secondary | ICD-10-CM | POA: Diagnosis not present

## 2012-03-09 DIAGNOSIS — M9981 Other biomechanical lesions of cervical region: Secondary | ICD-10-CM | POA: Diagnosis not present

## 2012-03-09 DIAGNOSIS — S239XXA Sprain of unspecified parts of thorax, initial encounter: Secondary | ICD-10-CM | POA: Diagnosis not present

## 2012-03-09 DIAGNOSIS — S335XXA Sprain of ligaments of lumbar spine, initial encounter: Secondary | ICD-10-CM | POA: Diagnosis not present

## 2012-03-09 DIAGNOSIS — S139XXA Sprain of joints and ligaments of unspecified parts of neck, initial encounter: Secondary | ICD-10-CM | POA: Diagnosis not present

## 2012-03-13 DIAGNOSIS — S139XXA Sprain of joints and ligaments of unspecified parts of neck, initial encounter: Secondary | ICD-10-CM | POA: Diagnosis not present

## 2012-03-13 DIAGNOSIS — M999 Biomechanical lesion, unspecified: Secondary | ICD-10-CM | POA: Diagnosis not present

## 2012-03-13 DIAGNOSIS — S239XXA Sprain of unspecified parts of thorax, initial encounter: Secondary | ICD-10-CM | POA: Diagnosis not present

## 2012-03-13 DIAGNOSIS — M9981 Other biomechanical lesions of cervical region: Secondary | ICD-10-CM | POA: Diagnosis not present

## 2012-03-13 DIAGNOSIS — S335XXA Sprain of ligaments of lumbar spine, initial encounter: Secondary | ICD-10-CM | POA: Diagnosis not present

## 2012-03-16 DIAGNOSIS — S139XXA Sprain of joints and ligaments of unspecified parts of neck, initial encounter: Secondary | ICD-10-CM | POA: Diagnosis not present

## 2012-03-16 DIAGNOSIS — S239XXA Sprain of unspecified parts of thorax, initial encounter: Secondary | ICD-10-CM | POA: Diagnosis not present

## 2012-03-16 DIAGNOSIS — M9981 Other biomechanical lesions of cervical region: Secondary | ICD-10-CM | POA: Diagnosis not present

## 2012-03-16 DIAGNOSIS — M999 Biomechanical lesion, unspecified: Secondary | ICD-10-CM | POA: Diagnosis not present

## 2012-03-16 DIAGNOSIS — S335XXA Sprain of ligaments of lumbar spine, initial encounter: Secondary | ICD-10-CM | POA: Diagnosis not present

## 2012-03-24 DIAGNOSIS — M9981 Other biomechanical lesions of cervical region: Secondary | ICD-10-CM | POA: Diagnosis not present

## 2012-03-24 DIAGNOSIS — S139XXA Sprain of joints and ligaments of unspecified parts of neck, initial encounter: Secondary | ICD-10-CM | POA: Diagnosis not present

## 2012-03-24 DIAGNOSIS — S335XXA Sprain of ligaments of lumbar spine, initial encounter: Secondary | ICD-10-CM | POA: Diagnosis not present

## 2012-03-24 DIAGNOSIS — M999 Biomechanical lesion, unspecified: Secondary | ICD-10-CM | POA: Diagnosis not present

## 2012-03-24 DIAGNOSIS — S239XXA Sprain of unspecified parts of thorax, initial encounter: Secondary | ICD-10-CM | POA: Diagnosis not present

## 2012-03-25 DIAGNOSIS — M9981 Other biomechanical lesions of cervical region: Secondary | ICD-10-CM | POA: Diagnosis not present

## 2012-03-25 DIAGNOSIS — S239XXA Sprain of unspecified parts of thorax, initial encounter: Secondary | ICD-10-CM | POA: Diagnosis not present

## 2012-03-25 DIAGNOSIS — S139XXA Sprain of joints and ligaments of unspecified parts of neck, initial encounter: Secondary | ICD-10-CM | POA: Diagnosis not present

## 2012-03-25 DIAGNOSIS — S335XXA Sprain of ligaments of lumbar spine, initial encounter: Secondary | ICD-10-CM | POA: Diagnosis not present

## 2012-03-25 DIAGNOSIS — M999 Biomechanical lesion, unspecified: Secondary | ICD-10-CM | POA: Diagnosis not present

## 2012-03-30 DIAGNOSIS — M999 Biomechanical lesion, unspecified: Secondary | ICD-10-CM | POA: Diagnosis not present

## 2012-03-30 DIAGNOSIS — S335XXA Sprain of ligaments of lumbar spine, initial encounter: Secondary | ICD-10-CM | POA: Diagnosis not present

## 2012-03-30 DIAGNOSIS — S239XXA Sprain of unspecified parts of thorax, initial encounter: Secondary | ICD-10-CM | POA: Diagnosis not present

## 2012-03-30 DIAGNOSIS — S139XXA Sprain of joints and ligaments of unspecified parts of neck, initial encounter: Secondary | ICD-10-CM | POA: Diagnosis not present

## 2012-03-30 DIAGNOSIS — M9981 Other biomechanical lesions of cervical region: Secondary | ICD-10-CM | POA: Diagnosis not present

## 2012-04-01 DIAGNOSIS — S139XXA Sprain of joints and ligaments of unspecified parts of neck, initial encounter: Secondary | ICD-10-CM | POA: Diagnosis not present

## 2012-04-01 DIAGNOSIS — S239XXA Sprain of unspecified parts of thorax, initial encounter: Secondary | ICD-10-CM | POA: Diagnosis not present

## 2012-04-01 DIAGNOSIS — M999 Biomechanical lesion, unspecified: Secondary | ICD-10-CM | POA: Diagnosis not present

## 2012-04-01 DIAGNOSIS — M9981 Other biomechanical lesions of cervical region: Secondary | ICD-10-CM | POA: Diagnosis not present

## 2012-04-01 DIAGNOSIS — S335XXA Sprain of ligaments of lumbar spine, initial encounter: Secondary | ICD-10-CM | POA: Diagnosis not present

## 2012-04-22 DIAGNOSIS — S139XXA Sprain of joints and ligaments of unspecified parts of neck, initial encounter: Secondary | ICD-10-CM | POA: Diagnosis not present

## 2012-04-22 DIAGNOSIS — S239XXA Sprain of unspecified parts of thorax, initial encounter: Secondary | ICD-10-CM | POA: Diagnosis not present

## 2012-04-22 DIAGNOSIS — M9981 Other biomechanical lesions of cervical region: Secondary | ICD-10-CM | POA: Diagnosis not present

## 2012-04-22 DIAGNOSIS — S335XXA Sprain of ligaments of lumbar spine, initial encounter: Secondary | ICD-10-CM | POA: Diagnosis not present

## 2012-04-22 DIAGNOSIS — M999 Biomechanical lesion, unspecified: Secondary | ICD-10-CM | POA: Diagnosis not present

## 2012-04-28 DIAGNOSIS — S335XXA Sprain of ligaments of lumbar spine, initial encounter: Secondary | ICD-10-CM | POA: Diagnosis not present

## 2012-04-28 DIAGNOSIS — S239XXA Sprain of unspecified parts of thorax, initial encounter: Secondary | ICD-10-CM | POA: Diagnosis not present

## 2012-04-28 DIAGNOSIS — M999 Biomechanical lesion, unspecified: Secondary | ICD-10-CM | POA: Diagnosis not present

## 2012-04-28 DIAGNOSIS — S139XXA Sprain of joints and ligaments of unspecified parts of neck, initial encounter: Secondary | ICD-10-CM | POA: Diagnosis not present

## 2012-04-28 DIAGNOSIS — M9981 Other biomechanical lesions of cervical region: Secondary | ICD-10-CM | POA: Diagnosis not present

## 2012-05-04 ENCOUNTER — Other Ambulatory Visit: Payer: Self-pay | Admitting: *Deleted

## 2012-05-04 ENCOUNTER — Other Ambulatory Visit (INDEPENDENT_AMBULATORY_CARE_PROVIDER_SITE_OTHER): Payer: Medicare Other

## 2012-05-04 DIAGNOSIS — I4949 Other premature depolarization: Secondary | ICD-10-CM

## 2012-05-04 DIAGNOSIS — E7800 Pure hypercholesterolemia, unspecified: Secondary | ICD-10-CM | POA: Diagnosis not present

## 2012-05-04 DIAGNOSIS — I493 Ventricular premature depolarization: Secondary | ICD-10-CM

## 2012-05-04 DIAGNOSIS — E039 Hypothyroidism, unspecified: Secondary | ICD-10-CM | POA: Diagnosis not present

## 2012-05-04 DIAGNOSIS — I10 Essential (primary) hypertension: Secondary | ICD-10-CM | POA: Diagnosis not present

## 2012-05-04 LAB — TSH: TSH: 0.17 u[IU]/mL — ABNORMAL LOW (ref 0.35–5.50)

## 2012-05-04 LAB — LIPID PANEL
HDL: 78.9 mg/dL (ref >39.00)
LDL Cholesterol: 80 mg/dL (ref 0–99)
Total CHOL/HDL Ratio: 2
Triglycerides: 152 mg/dL — ABNORMAL HIGH (ref 0.0–149.0)

## 2012-05-04 LAB — HEPATIC FUNCTION PANEL
Albumin: 3.3 g/dL — ABNORMAL LOW (ref 3.5–5.2)
Total Bilirubin: 2 mg/dL — ABNORMAL HIGH (ref 0.3–1.2)

## 2012-05-04 LAB — BASIC METABOLIC PANEL
CO2: 30 mEq/L (ref 19–32)
Calcium: 8.7 mg/dL (ref 8.4–10.5)
Chloride: 100 mEq/L (ref 96–112)
Glucose, Bld: 128 mg/dL — ABNORMAL HIGH (ref 70–99)
Sodium: 137 mEq/L (ref 135–145)

## 2012-05-04 LAB — T4, FREE: Free T4: 1.34 ng/dL (ref 0.60–1.60)

## 2012-05-04 NOTE — Progress Notes (Signed)
Quick Note:  Please make copy of labs for patient visit. ______ 

## 2012-05-05 DIAGNOSIS — S335XXA Sprain of ligaments of lumbar spine, initial encounter: Secondary | ICD-10-CM | POA: Diagnosis not present

## 2012-05-05 DIAGNOSIS — M9981 Other biomechanical lesions of cervical region: Secondary | ICD-10-CM | POA: Diagnosis not present

## 2012-05-05 DIAGNOSIS — S139XXA Sprain of joints and ligaments of unspecified parts of neck, initial encounter: Secondary | ICD-10-CM | POA: Diagnosis not present

## 2012-05-05 DIAGNOSIS — M999 Biomechanical lesion, unspecified: Secondary | ICD-10-CM | POA: Diagnosis not present

## 2012-05-05 DIAGNOSIS — S239XXA Sprain of unspecified parts of thorax, initial encounter: Secondary | ICD-10-CM | POA: Diagnosis not present

## 2012-05-06 DIAGNOSIS — Z85828 Personal history of other malignant neoplasm of skin: Secondary | ICD-10-CM | POA: Diagnosis not present

## 2012-05-06 DIAGNOSIS — D485 Neoplasm of uncertain behavior of skin: Secondary | ICD-10-CM | POA: Diagnosis not present

## 2012-05-06 DIAGNOSIS — L570 Actinic keratosis: Secondary | ICD-10-CM | POA: Diagnosis not present

## 2012-05-07 ENCOUNTER — Encounter: Payer: Self-pay | Admitting: Cardiology

## 2012-05-07 ENCOUNTER — Ambulatory Visit (INDEPENDENT_AMBULATORY_CARE_PROVIDER_SITE_OTHER): Payer: Medicare Other | Admitting: Cardiology

## 2012-05-07 VITALS — BP 133/70 | HR 54 | Ht 72.0 in | Wt 202.0 lb

## 2012-05-07 DIAGNOSIS — E039 Hypothyroidism, unspecified: Secondary | ICD-10-CM

## 2012-05-07 DIAGNOSIS — I4949 Other premature depolarization: Secondary | ICD-10-CM

## 2012-05-07 DIAGNOSIS — E785 Hyperlipidemia, unspecified: Secondary | ICD-10-CM

## 2012-05-07 DIAGNOSIS — E7800 Pure hypercholesterolemia, unspecified: Secondary | ICD-10-CM | POA: Diagnosis not present

## 2012-05-07 DIAGNOSIS — I10 Essential (primary) hypertension: Secondary | ICD-10-CM

## 2012-05-07 DIAGNOSIS — I493 Ventricular premature depolarization: Secondary | ICD-10-CM

## 2012-05-07 MED ORDER — EZETIMIBE-SIMVASTATIN 10-20 MG PO TABS: 1.0000 | ORAL_TABLET | Freq: Every day | ORAL | Status: DC

## 2012-05-07 NOTE — Assessment & Plan Note (Signed)
No chest pain or shortness of breath.  He continues to work out with a Psychologist, educational 2 days a week

## 2012-05-07 NOTE — Patient Instructions (Signed)
Decrease your Vytorin to 10/20 mg daily, Rx sent to [facility]  Your physician recommends that you schedule a follow-up appointment in: 4 months with fasting labs (lp/bmet/hfp)

## 2012-05-07 NOTE — Assessment & Plan Note (Signed)
Over the holidays the patient was consuming more coffee and noted that the PVCs had increased.  He stopped drinking caffeinated beverages all together and the PVCs have subsided.

## 2012-05-07 NOTE — Assessment & Plan Note (Signed)
The patient is clinically euthyroid.  Thyroid function studies are normal today

## 2012-05-07 NOTE — Assessment & Plan Note (Signed)
The patient is on Vytorin 10/44 hypercholesterolemia.  His liver function studies are elevated this time.  The patient also had been drinking more wine over the holidays.  We have recommended cutting back on alcohol intake.  Also we will decrease his Vytorin down to Vytorin 10/20 one daily

## 2012-05-07 NOTE — Progress Notes (Signed)
Carlos Juarez Date of Birth:  10-06-1940 [facility] [address] [place], [place]  [address] [phone]  Fax   [phone]  HPI: This pleasant 72 year old gentleman is seen for a scheduled followup office visit. He has a history of essential hypertension and a history of hypercholesterolemia. He does not have any history of ischemic heart disease. He had a normal treadmill Cardiolite stress test on 07/24/05 which showed no ischemia and his ejection fraction was 57% with no wall motion abnormalities. He also has a history of hypothyroid disease. Since his last visit he has been doing well.  Recently over the holidays he has not been as careful with his diet although his weight is unchanged.   Current Outpatient Prescriptions  Medication Sig Dispense Refill  . ALPRAZolam (XANAX) 0.5 MG tablet Take 1 tablet (0.5 mg total) by mouth as needed for anxiety.  30 tablet  5  . aspirin 81 MG tablet Take 81 mg by mouth daily.        Marland Kitchen atenolol (TENORMIN) 50 MG tablet Take 50 mg by mouth daily.        . Coenzyme Q10 (COQ10 PO) Take by mouth daily.        . fish oil-omega-3 fatty acids 1000 MG capsule Take 2 g by mouth daily. Taking liquid 1 tsp daily      . levothyroxine (SYNTHROID, LEVOTHROID) 150 MCG tablet Take 150 mcg by mouth daily.        Marland Kitchen ezetimibe-simvastatin (VYTORIN) 10-20 MG per tablet Take 1 tablet by mouth at bedtime.  90 tablet  3    Allergies  Allergen Reactions  . Epinephrine     Patient Active Problem List  Diagnosis  . HTN (hypertension)  . Hypercholesteremia  . Hypothyroidism  . PVC's (premature ventricular contractions)  . Cervical spondylosis    History  Smoking status  . Former Smoker  Smokeless tobacco  . Not on file    History  Alcohol Use: Not on file    No family history on file.  Review of Systems: The patient denies any heat or cold intolerance.  No weight gain or weight loss.  The patient denies headaches or blurry  vision.  There is no cough or sputum production.  The patient denies dizziness.  There is no hematuria or hematochezia.  The patient denies any muscle aches or arthritis.  The patient denies any rash.  The patient denies frequent falling or instability.  There is no history of depression or anxiety.  All other systems were reviewed and are negative.   Physical Exam: Filed Vitals:   05/07/12 0825  BP: 133/70  Pulse: 54   the general appearance reveals a well-developed well-nourished gentleman in no distress.The head and neck exam reveals pupils equal and reactive.  Extraocular movements are full.  There is no scleral icterus.  The mouth and pharynx are normal.  The neck is supple.  The carotids reveal no bruits.  The jugular venous pressure is normal.  The  thyroid is not enlarged.  There is no lymphadenopathy.  The chest is clear to percussion and auscultation.  There are no rales or rhonchi.  Expansion of the chest is symmetrical.  The precordium is quiet.  The first heart sound is normal.  The second heart sound is physiologically split.  There is no murmur gallop rub or click.  There is no abnormal lift or heave.  The abdomen is soft and nontender.  The bowel sounds are normal.  The liver and spleen are not enlarged.  There are no abdominal masses.  There are no abdominal bruits.  Extremities reveal good pedal pulses.  There is no phlebitis or edema.  There is no cyanosis or clubbing.  Strength is normal and symmetrical in all extremities.  There is no lateralizing weakness.  There are no sensory deficits.  The skin is warm and dry.  There is no rash.      Assessment / Plan: We will decrease the dose of her Vytorin because of abnormal liver function studies.  The patient will continue to limit his alcohol intake and to avoid caffeine.  Continue regular exercise.  Recheck in 4 months for followup office visit and fasting lab work.

## 2012-05-19 DIAGNOSIS — M9981 Other biomechanical lesions of cervical region: Secondary | ICD-10-CM | POA: Diagnosis not present

## 2012-05-19 DIAGNOSIS — S239XXA Sprain of unspecified parts of thorax, initial encounter: Secondary | ICD-10-CM | POA: Diagnosis not present

## 2012-05-19 DIAGNOSIS — M999 Biomechanical lesion, unspecified: Secondary | ICD-10-CM | POA: Diagnosis not present

## 2012-05-19 DIAGNOSIS — S139XXA Sprain of joints and ligaments of unspecified parts of neck, initial encounter: Secondary | ICD-10-CM | POA: Diagnosis not present

## 2012-05-19 DIAGNOSIS — S335XXA Sprain of ligaments of lumbar spine, initial encounter: Secondary | ICD-10-CM | POA: Diagnosis not present

## 2012-06-02 DIAGNOSIS — S335XXA Sprain of ligaments of lumbar spine, initial encounter: Secondary | ICD-10-CM | POA: Diagnosis not present

## 2012-06-02 DIAGNOSIS — S139XXA Sprain of joints and ligaments of unspecified parts of neck, initial encounter: Secondary | ICD-10-CM | POA: Diagnosis not present

## 2012-06-02 DIAGNOSIS — M999 Biomechanical lesion, unspecified: Secondary | ICD-10-CM | POA: Diagnosis not present

## 2012-06-02 DIAGNOSIS — S239XXA Sprain of unspecified parts of thorax, initial encounter: Secondary | ICD-10-CM | POA: Diagnosis not present

## 2012-06-02 DIAGNOSIS — M9981 Other biomechanical lesions of cervical region: Secondary | ICD-10-CM | POA: Diagnosis not present

## 2012-06-08 DIAGNOSIS — B029 Zoster without complications: Secondary | ICD-10-CM | POA: Diagnosis not present

## 2012-06-23 DIAGNOSIS — S139XXA Sprain of joints and ligaments of unspecified parts of neck, initial encounter: Secondary | ICD-10-CM | POA: Diagnosis not present

## 2012-06-23 DIAGNOSIS — M9981 Other biomechanical lesions of cervical region: Secondary | ICD-10-CM | POA: Diagnosis not present

## 2012-06-23 DIAGNOSIS — S239XXA Sprain of unspecified parts of thorax, initial encounter: Secondary | ICD-10-CM | POA: Diagnosis not present

## 2012-06-23 DIAGNOSIS — M999 Biomechanical lesion, unspecified: Secondary | ICD-10-CM | POA: Diagnosis not present

## 2012-06-23 DIAGNOSIS — S335XXA Sprain of ligaments of lumbar spine, initial encounter: Secondary | ICD-10-CM | POA: Diagnosis not present

## 2012-06-25 ENCOUNTER — Other Ambulatory Visit: Payer: Self-pay | Admitting: *Deleted

## 2012-06-25 DIAGNOSIS — E7800 Pure hypercholesterolemia, unspecified: Secondary | ICD-10-CM

## 2012-06-25 MED ORDER — EZETIMIBE-SIMVASTATIN 10-20 MG PO TABS: 1.0000 | ORAL_TABLET | Freq: Every day | ORAL | Status: DC

## 2012-08-04 DIAGNOSIS — M9981 Other biomechanical lesions of cervical region: Secondary | ICD-10-CM | POA: Diagnosis not present

## 2012-08-04 DIAGNOSIS — S335XXA Sprain of ligaments of lumbar spine, initial encounter: Secondary | ICD-10-CM | POA: Diagnosis not present

## 2012-08-04 DIAGNOSIS — M999 Biomechanical lesion, unspecified: Secondary | ICD-10-CM | POA: Diagnosis not present

## 2012-08-04 DIAGNOSIS — S239XXA Sprain of unspecified parts of thorax, initial encounter: Secondary | ICD-10-CM | POA: Diagnosis not present

## 2012-08-04 DIAGNOSIS — S139XXA Sprain of joints and ligaments of unspecified parts of neck, initial encounter: Secondary | ICD-10-CM | POA: Diagnosis not present

## 2012-09-03 ENCOUNTER — Other Ambulatory Visit (INDEPENDENT_AMBULATORY_CARE_PROVIDER_SITE_OTHER): Payer: Medicare Other

## 2012-09-03 DIAGNOSIS — E7800 Pure hypercholesterolemia, unspecified: Secondary | ICD-10-CM

## 2012-09-03 LAB — HEPATIC FUNCTION PANEL
AST: 24 U/L (ref 0–37)
Albumin: 2.9 g/dL — ABNORMAL LOW (ref 3.5–5.2)
Alkaline Phosphatase: 46 U/L (ref 39–117)
Total Protein: 5.7 g/dL — ABNORMAL LOW (ref 6.0–8.3)

## 2012-09-03 LAB — LIPID PANEL
Cholesterol: 151 mg/dL (ref 0–200)
HDL: 70.5 mg/dL (ref >39.00)
LDL Cholesterol: 65 mg/dL (ref 0–99)
Total CHOL/HDL Ratio: 2
Triglycerides: 78 mg/dL (ref 0.0–149.0)

## 2012-09-03 LAB — BASIC METABOLIC PANEL
Calcium: 8.7 mg/dL (ref 8.4–10.5)
Chloride: 105 mEq/L (ref 96–112)
Creatinine, Ser: 1.1 mg/dL (ref 0.4–1.5)
Sodium: 138 mEq/L (ref 135–145)

## 2012-09-08 ENCOUNTER — Encounter: Payer: Self-pay | Admitting: Cardiology

## 2012-09-08 ENCOUNTER — Ambulatory Visit (INDEPENDENT_AMBULATORY_CARE_PROVIDER_SITE_OTHER): Payer: Medicare Other | Admitting: Cardiology

## 2012-09-08 VITALS — BP 118/72 | HR 57 | Ht 72.0 in | Wt 197.0 lb

## 2012-09-08 DIAGNOSIS — E7800 Pure hypercholesterolemia, unspecified: Secondary | ICD-10-CM | POA: Diagnosis not present

## 2012-09-08 DIAGNOSIS — E039 Hypothyroidism, unspecified: Secondary | ICD-10-CM | POA: Diagnosis not present

## 2012-09-08 DIAGNOSIS — I10 Essential (primary) hypertension: Secondary | ICD-10-CM | POA: Diagnosis not present

## 2012-09-08 NOTE — Patient Instructions (Addendum)
Your physician recommends that you continue on your current medications as directed. Please refer to the Current Medication list given to you today.  Your physician recommends that you schedule a follow-up appointment in: 4 months with fasting labs (lp/bmet/hfp/t4/tsh) and ekg

## 2012-09-08 NOTE — Assessment & Plan Note (Signed)
At his last visit we cut back on his dose of simvastatin because of elevated LFTs.  Despite cutting back his cholesterol numbers are better this time indicating beneficial effect of his dietary change.

## 2012-09-08 NOTE — Progress Notes (Signed)
Carlos Juarez Date of Birth:  03-05-1941 [facility] [address] [place], [place]  [address] [phone]  Fax   [phone]  HPI: This pleasant 72 year old gentleman is seen for a scheduled followup office visit. He has a history of essential hypertension and a history of hypercholesterolemia. He does not have any history of ischemic heart disease. He had a normal treadmill Cardiolite stress test on 07/24/05 which showed no ischemia and his ejection fraction was 57% with no wall motion abnormalities. He also has a history of hypothyroid disease. Since his last visit he has been doing well.  He has lost 5 pounds since last visit and has been on a more heart healthy diet.   Current Outpatient Prescriptions  Medication Sig Dispense Refill  . ALPRAZolam (XANAX) 0.5 MG tablet Take 1 tablet (0.5 mg total) by mouth as needed for anxiety.  30 tablet  5  . aspirin 81 MG tablet Take 81 mg by mouth daily.        Marland Kitchen atenolol (TENORMIN) 50 MG tablet Take 50 mg by mouth daily.        . Coenzyme Q10 (COQ10 PO) Take by mouth daily.        Marland Kitchen ezetimibe-simvastatin (VYTORIN) 10-20 MG per tablet Take 1 tablet by mouth at bedtime.  90 tablet  3  . fish oil-omega-3 fatty acids 1000 MG capsule Take 2 g by mouth daily. Taking liquid 1 tsp daily      . levothyroxine (SYNTHROID, LEVOTHROID) 150 MCG tablet Take 150 mcg by mouth daily.         No current facility-administered medications for this visit.    Allergies  Allergen Reactions  . Epinephrine     Patient Active Problem List   Diagnosis Date Noted  . Hypercholesteremia     Priority: High  . 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 HTN (hypertension)   . Hypothyroidism   . PVC's (premature ventricular contractions)     History  Smoking status  . Former Smoker  Smokeless tobacco  . Not on file    History  Alcohol Use: Not on file    No family history on file.  Review of Systems: The patient denies any heat or cold  intolerance.  No weight gain or weight loss.  The patient denies headaches or blurry vision.  There is no cough or sputum production.  The patient denies dizziness.  There is no hematuria or hematochezia.  The patient denies any muscle aches or arthritis.  The patient denies any rash.  The patient denies frequent falling or instability.  There is no history of depression or anxiety.  All other systems were reviewed and are negative.   Physical Exam: Filed Vitals:   09/08/12 0837  BP: 118/72  Pulse: 57   the general appearance reveals a healthy-appearing gentleman in no distress.The head and neck exam reveals pupils equal and reactive.  Extraocular movements are full.  There is no scleral icterus.  The mouth and pharynx are normal.  The neck is supple.  The carotids reveal no bruits.  The jugular venous pressure is normal.  The  thyroid is not enlarged.  There is no lymphadenopathy.  The chest is clear to percussion and auscultation.  There are no rales or rhonchi.  Expansion of the chest is symmetrical.  The precordium is quiet.  The first heart sound is normal.  The second heart sound is physiologically split.  There is no murmur gallop rub or click.  There  is no abnormal lift or heave.  The abdomen is soft and nontender.  The bowel sounds are normal.  The liver and spleen are not enlarged.  There are no abdominal masses.  There are no abdominal bruits.  Extremities reveal good pedal pulses.  There is no phlebitis or edema.  There is no cyanosis or clubbing.  Strength is normal and symmetrical in all extremities.  There is no lateralizing weakness.  There are no sensory deficits.  The skin is warm and dry.  There is no rash.     Assessment / Plan: Continue same medication.  Recheck in 4 months for office visit EKG TSH free T4 lipid panel hepatic function panel and basal metabolic panel

## 2012-09-08 NOTE — Assessment & Plan Note (Signed)
The patient has not been any chest pain or shortness of breath.  He continues to exercise regularly.

## 2012-09-16 DIAGNOSIS — M9981 Other biomechanical lesions of cervical region: Secondary | ICD-10-CM | POA: Diagnosis not present

## 2012-09-16 DIAGNOSIS — S239XXA Sprain of unspecified parts of thorax, initial encounter: Secondary | ICD-10-CM | POA: Diagnosis not present

## 2012-09-16 DIAGNOSIS — S139XXA Sprain of joints and ligaments of unspecified parts of neck, initial encounter: Secondary | ICD-10-CM | POA: Diagnosis not present

## 2012-09-16 DIAGNOSIS — M999 Biomechanical lesion, unspecified: Secondary | ICD-10-CM | POA: Diagnosis not present

## 2012-09-16 DIAGNOSIS — S335XXA Sprain of ligaments of lumbar spine, initial encounter: Secondary | ICD-10-CM | POA: Diagnosis not present

## 2012-10-28 DIAGNOSIS — S335XXA Sprain of ligaments of lumbar spine, initial encounter: Secondary | ICD-10-CM | POA: Diagnosis not present

## 2012-10-28 DIAGNOSIS — S139XXA Sprain of joints and ligaments of unspecified parts of neck, initial encounter: Secondary | ICD-10-CM | POA: Diagnosis not present

## 2012-10-28 DIAGNOSIS — M9981 Other biomechanical lesions of cervical region: Secondary | ICD-10-CM | POA: Diagnosis not present

## 2012-10-28 DIAGNOSIS — M999 Biomechanical lesion, unspecified: Secondary | ICD-10-CM | POA: Diagnosis not present

## 2012-10-28 DIAGNOSIS — S239XXA Sprain of unspecified parts of thorax, initial encounter: Secondary | ICD-10-CM | POA: Diagnosis not present

## 2012-12-10 DIAGNOSIS — J019 Acute sinusitis, unspecified: Secondary | ICD-10-CM | POA: Diagnosis not present

## 2012-12-10 DIAGNOSIS — J309 Allergic rhinitis, unspecified: Secondary | ICD-10-CM | POA: Diagnosis not present

## 2012-12-10 DIAGNOSIS — J029 Acute pharyngitis, unspecified: Secondary | ICD-10-CM | POA: Diagnosis not present

## 2012-12-17 ENCOUNTER — Other Ambulatory Visit (INDEPENDENT_AMBULATORY_CARE_PROVIDER_SITE_OTHER): Payer: Medicare Other

## 2012-12-17 DIAGNOSIS — E7800 Pure hypercholesterolemia, unspecified: Secondary | ICD-10-CM

## 2012-12-17 DIAGNOSIS — E039 Hypothyroidism, unspecified: Secondary | ICD-10-CM

## 2012-12-17 LAB — BASIC METABOLIC PANEL
CO2: 32 mEq/L (ref 19–32)
Calcium: 8.7 mg/dL (ref 8.4–10.5)
Creatinine, Ser: 1 mg/dL (ref 0.4–1.5)
GFR: 74.72 mL/min (ref >60.00)
Sodium: 135 mEq/L (ref 135–145)

## 2012-12-17 LAB — LIPID PANEL
Cholesterol: 175 mg/dL (ref 0–200)
HDL: 54.1 mg/dL (ref >39.00)
Total CHOL/HDL Ratio: 3
Triglycerides: 119 mg/dL (ref 0.0–149.0)

## 2012-12-17 LAB — HEPATIC FUNCTION PANEL
AST: 27 U/L (ref 0–37)
Albumin: 3.1 g/dL — ABNORMAL LOW (ref 3.5–5.2)
Alkaline Phosphatase: 54 U/L (ref 39–117)
Total Protein: 6.2 g/dL (ref 6.0–8.3)

## 2012-12-17 LAB — TSH: TSH: 0.81 u[IU]/mL (ref 0.35–5.50)

## 2012-12-17 LAB — T4, FREE: Free T4: 1.62 ng/dL — ABNORMAL HIGH (ref 0.60–1.60)

## 2012-12-17 NOTE — Progress Notes (Signed)
Quick Note:  Please make copy of labs for patient visit. ______ 

## 2012-12-21 DIAGNOSIS — J019 Acute sinusitis, unspecified: Secondary | ICD-10-CM | POA: Diagnosis not present

## 2012-12-21 DIAGNOSIS — J209 Acute bronchitis, unspecified: Secondary | ICD-10-CM | POA: Diagnosis not present

## 2012-12-23 ENCOUNTER — Ambulatory Visit: Payer: Medicare Other | Admitting: Cardiology

## 2012-12-31 ENCOUNTER — Encounter: Payer: Self-pay | Admitting: Cardiology

## 2012-12-31 ENCOUNTER — Ambulatory Visit (INDEPENDENT_AMBULATORY_CARE_PROVIDER_SITE_OTHER): Payer: Medicare Other | Admitting: Cardiology

## 2012-12-31 VITALS — BP 140/80 | HR 60 | Ht 72.0 in | Wt 195.0 lb

## 2012-12-31 DIAGNOSIS — E7800 Pure hypercholesterolemia, unspecified: Secondary | ICD-10-CM | POA: Diagnosis not present

## 2012-12-31 DIAGNOSIS — I4949 Other premature depolarization: Secondary | ICD-10-CM

## 2012-12-31 DIAGNOSIS — I10 Essential (primary) hypertension: Secondary | ICD-10-CM

## 2012-12-31 DIAGNOSIS — I493 Ventricular premature depolarization: Secondary | ICD-10-CM

## 2012-12-31 DIAGNOSIS — I119 Hypertensive heart disease without heart failure: Secondary | ICD-10-CM | POA: Diagnosis not present

## 2012-12-31 DIAGNOSIS — E039 Hypothyroidism, unspecified: Secondary | ICD-10-CM | POA: Diagnosis not present

## 2012-12-31 NOTE — Progress Notes (Signed)
Carlos Juarez Date of Birth:  Aug 14, 1940 [facility] [address] [place], [place]  [address] [phone]  Fax   [phone]  HPI: This pleasant 72 year old gentleman is seen for a scheduled followup office visit. He has a history of essential hypertension and a history of hypercholesterolemia. He does not have any history of ischemic heart disease. He had a normal treadmill Cardiolite stress test on 07/24/05 which showed no ischemia and his ejection fraction was 57% with no wall motion abnormalities. He also has a history of hypothyroid disease.  Since last visit he has not had any new cardiovascular symptoms.  However he has had a recent severe bronchitis and sinusitis and has been on several heavy doses of antibiotics from his ENT physician.  He feels as if he is gradually getting better.  His weight is down 2 pounds.   Current Outpatient Prescriptions  Medication Sig Dispense Refill  . ALPRAZolam (XANAX) 0.5 MG tablet Take 1 tablet (0.5 mg total) by mouth as needed for anxiety.  30 tablet  5  . aspirin 81 MG tablet Take 81 mg by mouth daily.        Marland Kitchen atenolol (TENORMIN) 50 MG tablet Take 50 mg by mouth daily.        . Coenzyme Q10 (COQ10 PO) Take by mouth daily.        Marland Kitchen ezetimibe-simvastatin (VYTORIN) 10-20 MG per tablet Take 1 tablet by mouth at bedtime.  90 tablet  3  . fish oil-omega-3 fatty acids 1000 MG capsule Take 2 g by mouth daily. Taking liquid 1 tsp daily      . levofloxacin (LEVAQUIN) 500 MG tablet Take 500 mg by mouth daily. Take for 15 days as directed      . levothyroxine (SYNTHROID, LEVOTHROID) 150 MCG tablet Take 150 mcg by mouth daily.         No current facility-administered medications for this visit.    Allergies  Allergen Reactions  . Epinephrine     Patient Active Problem List   Diagnosis Date Noted  . Hypercholesteremia     Priority: High  . 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 HTN (hypertension)   . Hypothyroidism   .  PVC's (premature ventricular contractions)     History  Smoking status  . Former Smoker  Smokeless tobacco  . Not on file    History  Alcohol Use: Not on file    No family history on file.  Review of Systems: The patient denies any heat or cold intolerance.  No weight gain or weight loss.  The patient denies headaches or blurry vision.  There is no cough or sputum production.  The patient denies dizziness.  There is no hematuria or hematochezia.  The patient denies any muscle aches or arthritis.  The patient denies any rash.  The patient denies frequent falling or instability.  There is no history of depression or anxiety.  All other systems were reviewed and are negative.   Physical Exam: Filed Vitals:   12/31/12 1550  BP: 140/80  Pulse: 60   the general appearance reveals a healthy-appearing gentleman in no distress.The head and neck exam reveals pupils equal and reactive.  Extraocular movements are full.  There is no scleral icterus.  The mouth and pharynx are normal.  The neck is supple.  The carotids reveal no bruits.  The jugular venous pressure is normal.  The  thyroid is not enlarged.  There is no lymphadenopathy.  The chest is  clear to percussion and auscultation.  There are no rales or rhonchi.  Expansion of the chest is symmetrical.  The precordium is quiet.  The first heart sound is normal.  The second heart sound is physiologically split.  There is no murmur gallop rub or click.  There is no abnormal lift or heave.  The abdomen is soft and nontender.  The bowel sounds are normal.  The liver and spleen are not enlarged.  There are no abdominal masses.  There are no abdominal bruits.  Extremities reveal good pedal pulses.  There is no phlebitis or edema.  There is no cyanosis or clubbing.  Strength is normal and symmetrical in all extremities.  There is no lateralizing weakness.  There are no sensory deficits.  The skin is warm and dry.  There is no rash.     Assessment /  Plan: Continue same medication.  Recheck in 4 months for office visit , lipid panel hepatic function panel and basal metabolic panel. His HDL level has fallen because he has been inactive for the past 3 weeks because of his upper respiratory infection and lack of exercise.

## 2012-12-31 NOTE — Assessment & Plan Note (Signed)
Blood pressure was remaining stable on current medication.  I rechecked his pressure in the right arm today and is 126/80 sitting

## 2012-12-31 NOTE — Assessment & Plan Note (Signed)
Reviewed his labs which were satisfactory.  We had cut back on his statin dose because of elevated liver functions and his liver functions are now remaining normal

## 2012-12-31 NOTE — Patient Instructions (Addendum)
Your physician recommends that you continue on your current medications as directed. Please refer to the Current Medication list given to you today.  Your physician wants you to follow-up in: 4 months with fasting labs (lp/bmet/hfp) You will receive a reminder letter in the mail two months in advance. If you don't receive a letter, please call our office to schedule the follow-up appointment.  

## 2012-12-31 NOTE — Assessment & Plan Note (Signed)
The patient is avoiding caffeine.  He has not been aware of any recent PVCs

## 2013-01-18 ENCOUNTER — Encounter: Payer: Self-pay | Admitting: Cardiology

## 2013-02-01 DIAGNOSIS — IMO0002 Reserved for concepts with insufficient information to code with codable children: Secondary | ICD-10-CM | POA: Diagnosis not present

## 2013-02-01 DIAGNOSIS — M25569 Pain in unspecified knee: Secondary | ICD-10-CM | POA: Diagnosis not present

## 2013-02-08 DIAGNOSIS — H11449 Conjunctival cysts, unspecified eye: Secondary | ICD-10-CM | POA: Diagnosis not present

## 2013-02-08 DIAGNOSIS — H43819 Vitreous degeneration, unspecified eye: Secondary | ICD-10-CM | POA: Diagnosis not present

## 2013-02-08 DIAGNOSIS — H259 Unspecified age-related cataract: Secondary | ICD-10-CM | POA: Diagnosis not present

## 2013-02-08 DIAGNOSIS — H1130 Conjunctival hemorrhage, unspecified eye: Secondary | ICD-10-CM | POA: Diagnosis not present

## 2013-02-15 DIAGNOSIS — R29898 Other symptoms and signs involving the musculoskeletal system: Secondary | ICD-10-CM | POA: Diagnosis not present

## 2013-02-22 DIAGNOSIS — M1710 Unilateral primary osteoarthritis, unspecified knee: Secondary | ICD-10-CM | POA: Diagnosis not present

## 2013-02-25 DIAGNOSIS — M25569 Pain in unspecified knee: Secondary | ICD-10-CM | POA: Diagnosis not present

## 2013-03-03 DIAGNOSIS — M9420 Chondromalacia, unspecified site: Secondary | ICD-10-CM | POA: Diagnosis not present

## 2013-03-03 DIAGNOSIS — M23329 Other meniscus derangements, posterior horn of medial meniscus, unspecified knee: Secondary | ICD-10-CM | POA: Diagnosis not present

## 2013-03-03 DIAGNOSIS — IMO0002 Reserved for concepts with insufficient information to code with codable children: Secondary | ICD-10-CM | POA: Diagnosis not present

## 2013-03-03 DIAGNOSIS — M2240 Chondromalacia patellae, unspecified knee: Secondary | ICD-10-CM | POA: Diagnosis not present

## 2013-03-09 DIAGNOSIS — M25069 Hemarthrosis, unspecified knee: Secondary | ICD-10-CM | POA: Diagnosis not present

## 2013-04-16 ENCOUNTER — Other Ambulatory Visit: Payer: Medicare Other

## 2013-04-21 ENCOUNTER — Ambulatory Visit: Payer: Medicare Other | Admitting: Cardiology

## 2013-04-22 ENCOUNTER — Other Ambulatory Visit (INDEPENDENT_AMBULATORY_CARE_PROVIDER_SITE_OTHER): Payer: Medicare Other

## 2013-04-22 DIAGNOSIS — I119 Hypertensive heart disease without heart failure: Secondary | ICD-10-CM

## 2013-04-22 DIAGNOSIS — E7800 Pure hypercholesterolemia, unspecified: Secondary | ICD-10-CM

## 2013-04-22 LAB — HEPATIC FUNCTION PANEL
ALT: 26 U/L (ref 0–53)
AST: 22 U/L (ref 0–37)
Albumin: 3.2 g/dL — ABNORMAL LOW (ref 3.5–5.2)
Alkaline Phosphatase: 52 U/L (ref 39–117)
Bilirubin, Direct: 0.1 mg/dL (ref 0.0–0.3)
TOTAL PROTEIN: 6.1 g/dL (ref 6.0–8.3)
Total Bilirubin: 1.5 mg/dL — ABNORMAL HIGH (ref 0.3–1.2)

## 2013-04-22 LAB — BASIC METABOLIC PANEL
BUN: 19 mg/dL (ref 6–23)
CO2: 29 mEq/L (ref 19–32)
Calcium: 8.7 mg/dL (ref 8.4–10.5)
Chloride: 102 mEq/L (ref 96–112)
Creatinine, Ser: 1.1 mg/dL (ref 0.4–1.5)
GFR: 68.53 mL/min (ref >60.00)
Glucose, Bld: 110 mg/dL — ABNORMAL HIGH (ref 70–99)
Potassium: 3.5 mEq/L (ref 3.5–5.1)
SODIUM: 139 meq/L (ref 135–145)

## 2013-04-22 LAB — LIPID PANEL
Cholesterol: 180 mg/dL (ref 0–200)
HDL: 62.6 mg/dL (ref >39.00)
LDL Cholesterol: 96 mg/dL (ref 0–99)
Total CHOL/HDL Ratio: 3
Triglycerides: 105 mg/dL (ref 0.0–149.0)
VLDL: 21 mg/dL (ref 0.0–40.0)

## 2013-04-22 NOTE — Progress Notes (Signed)
Quick Note:  Please make copy of labs for patient visit. ______ 

## 2013-04-28 ENCOUNTER — Ambulatory Visit (INDEPENDENT_AMBULATORY_CARE_PROVIDER_SITE_OTHER): Payer: Medicare Other | Admitting: Cardiology

## 2013-04-28 ENCOUNTER — Encounter: Payer: Self-pay | Admitting: Cardiology

## 2013-04-28 VITALS — BP 130/88 | HR 58 | Ht 72.0 in | Wt 209.0 lb

## 2013-04-28 DIAGNOSIS — I4949 Other premature depolarization: Secondary | ICD-10-CM

## 2013-04-28 DIAGNOSIS — E7800 Pure hypercholesterolemia, unspecified: Secondary | ICD-10-CM | POA: Diagnosis not present

## 2013-04-28 DIAGNOSIS — I10 Essential (primary) hypertension: Secondary | ICD-10-CM

## 2013-04-28 DIAGNOSIS — E039 Hypothyroidism, unspecified: Secondary | ICD-10-CM

## 2013-04-28 DIAGNOSIS — F411 Generalized anxiety disorder: Secondary | ICD-10-CM

## 2013-04-28 DIAGNOSIS — F419 Anxiety disorder, unspecified: Secondary | ICD-10-CM

## 2013-04-28 DIAGNOSIS — I493 Ventricular premature depolarization: Secondary | ICD-10-CM

## 2013-04-28 DIAGNOSIS — I119 Hypertensive heart disease without heart failure: Secondary | ICD-10-CM | POA: Diagnosis not present

## 2013-04-28 MED ORDER — ALPRAZOLAM 0.5 MG PO TABS
0.5000 mg | ORAL_TABLET | ORAL | Status: DC | PRN
Start: 2013-04-28 — End: 2013-06-16

## 2013-04-28 NOTE — Assessment & Plan Note (Signed)
The patient is clinically euthyroid.  He remains on Synthroid.  We will recheck TSH at his next visit

## 2013-04-28 NOTE — Patient Instructions (Signed)
Your physician recommends that you continue on your current medications as directed. Please refer to the Current Medication list given to you today.  Your physician wants you to follow-up in: 4 months with fasting labs (lp/bmet/hfp) You will receive a reminder letter in the mail two months in advance. If you don't receive a letter, please call our office to schedule the follow-up appointment.  

## 2013-04-28 NOTE — Assessment & Plan Note (Signed)
The patient has not been aware of any recent PVCs.  He limits his coffee to just one cup a day.  He remains on beta blocker

## 2013-04-28 NOTE — Assessment & Plan Note (Signed)
The patient is having no side effects from his Vytorin.  Lipid levels are satisfactory.  His serum potassium is borderline low and he is not on diuretics.  He will try to increase intake of bananas and fruit.

## 2013-04-28 NOTE — Progress Notes (Signed)
Carlos Juarez Date of Birth:  1940/07/17 [address] [place],   [address] [phone]  Fax   [phone]  HPI: This pleasant 73 year old gentleman is seen for a scheduled followup office visit. He has a history of essential hypertension and a history of hypercholesterolemia. He does not have any history of ischemic heart disease. He had a normal treadmill Cardiolite stress test on 07/24/05 which showed no ischemia and his ejection fraction was 57% with no wall motion abnormalities. He also has a history of hypothyroid disease.  Since last visit he has not had any new cardiovascular symptoms.  Since we last saw him he injured his left knee and had surgery for a torn left meniscus on 03/03/13.  He has finished rehabilitation and is back working out with his trainer twice a week.  Since last visit the patient has gained 14 pounds.   Current Outpatient Prescriptions  Medication Sig Dispense Refill  . ALPRAZolam (XANAX) 0.5 MG tablet Take 1 tablet (0.5 mg total) by mouth as needed for anxiety.  30 tablet  5  . aspirin 81 MG tablet Take 81 mg by mouth daily.        Marland Kitchen atenolol (TENORMIN) 50 MG tablet Take 50 mg by mouth daily.        . Coenzyme Q10 (COQ10 PO) Take by mouth daily.        Marland Kitchen ezetimibe-simvastatin (VYTORIN) 10-20 MG per tablet Take 1 tablet by mouth at bedtime.  90 tablet  3  . fish oil-omega-3 fatty acids 1000 MG capsule Take 2 g by mouth daily. Taking liquid 1 tsp daily      . levothyroxine (SYNTHROID, LEVOTHROID) 150 MCG tablet Take 150 mcg by mouth daily.        Marland Kitchen levofloxacin (LEVAQUIN) 500 MG tablet Take 500 mg by mouth daily. Take for 15 days as directed       No current facility-administered medications for this visit.    Allergies  Allergen Reactions  . Epinephrine     Patient Active Problem List   Diagnosis Date Noted  . Hypercholesteremia     Priority: High  . 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 HTN (hypertension)   . Hypothyroidism    . PVC's (premature ventricular contractions)     History  Smoking status  . Former Smoker  Smokeless tobacco  . Not on file    History  Alcohol Use: Not on file    No family history on file.  Review of Systems: The patient denies any heat or cold intolerance.  No weight gain or weight loss.  The patient denies headaches or blurry vision.  There is no cough or sputum production.  The patient denies dizziness.  There is no hematuria or hematochezia.  The patient denies any muscle aches or arthritis.  The patient denies any rash.  The patient denies frequent falling or instability.  There is no history of depression or anxiety.  All other systems were reviewed and are negative.   Physical Exam: Filed Vitals:   04/28/13 0926  BP: 130/88  Pulse: 58   the general appearance reveals a healthy-appearing gentleman in no distress.The head and neck exam reveals pupils equal and reactive.  Extraocular movements are full.  There is no scleral icterus.  The mouth and pharynx are normal.  The neck is supple.  The carotids reveal no bruits.  The jugular venous pressure is normal.  The  thyroid is not enlarged.  There is  no lymphadenopathy.  The chest is clear to percussion and auscultation.  There are no rales or rhonchi.  Expansion of the chest is symmetrical.  The precordium is quiet.  The first heart sound is normal.  The second heart sound is physiologically split.  There is no murmur gallop rub or click.  There is no abnormal lift or heave.  The abdomen is soft and nontender.  The bowel sounds are normal.  The liver and spleen are not enlarged.  There are no abdominal masses.  There are no abdominal bruits.  Extremities reveal good pedal pulses.  There is no phlebitis or edema.  There is no cyanosis or clubbing.  Strength is normal and symmetrical in all extremities.  There is no lateralizing weakness.  There are no sensory deficits.  The skin is warm and dry.  There is no rash.     Assessment  / Plan: Continue same medication.  Recheck in 4 months for office visit , lipid panel hepatic function panel and basal metabolic panel.  And TSH.  Work harder on weight loss.  Increased potassium which foods

## 2013-04-28 NOTE — Assessment & Plan Note (Signed)
Diastolic blood pressure is a little higher today secondary to weight gain.  The patient is not having any chest pain or shortness of breath or palpitations.  Exercise tolerance remains good.  He continues to work out at [employer] twice a week.

## 2013-05-05 DIAGNOSIS — M9981 Other biomechanical lesions of cervical region: Secondary | ICD-10-CM | POA: Diagnosis not present

## 2013-05-05 DIAGNOSIS — S335XXA Sprain of ligaments of lumbar spine, initial encounter: Secondary | ICD-10-CM | POA: Diagnosis not present

## 2013-05-05 DIAGNOSIS — S139XXA Sprain of joints and ligaments of unspecified parts of neck, initial encounter: Secondary | ICD-10-CM | POA: Diagnosis not present

## 2013-05-05 DIAGNOSIS — M999 Biomechanical lesion, unspecified: Secondary | ICD-10-CM | POA: Diagnosis not present

## 2013-05-05 DIAGNOSIS — S239XXA Sprain of unspecified parts of thorax, initial encounter: Secondary | ICD-10-CM | POA: Diagnosis not present

## 2013-05-11 DIAGNOSIS — S239XXA Sprain of unspecified parts of thorax, initial encounter: Secondary | ICD-10-CM | POA: Diagnosis not present

## 2013-05-11 DIAGNOSIS — M9981 Other biomechanical lesions of cervical region: Secondary | ICD-10-CM | POA: Diagnosis not present

## 2013-05-11 DIAGNOSIS — S335XXA Sprain of ligaments of lumbar spine, initial encounter: Secondary | ICD-10-CM | POA: Diagnosis not present

## 2013-05-11 DIAGNOSIS — S139XXA Sprain of joints and ligaments of unspecified parts of neck, initial encounter: Secondary | ICD-10-CM | POA: Diagnosis not present

## 2013-05-11 DIAGNOSIS — M999 Biomechanical lesion, unspecified: Secondary | ICD-10-CM | POA: Diagnosis not present

## 2013-05-13 DIAGNOSIS — S335XXA Sprain of ligaments of lumbar spine, initial encounter: Secondary | ICD-10-CM | POA: Diagnosis not present

## 2013-05-13 DIAGNOSIS — M9981 Other biomechanical lesions of cervical region: Secondary | ICD-10-CM | POA: Diagnosis not present

## 2013-05-13 DIAGNOSIS — M999 Biomechanical lesion, unspecified: Secondary | ICD-10-CM | POA: Diagnosis not present

## 2013-05-13 DIAGNOSIS — S139XXA Sprain of joints and ligaments of unspecified parts of neck, initial encounter: Secondary | ICD-10-CM | POA: Diagnosis not present

## 2013-05-13 DIAGNOSIS — S239XXA Sprain of unspecified parts of thorax, initial encounter: Secondary | ICD-10-CM | POA: Diagnosis not present

## 2013-05-25 DIAGNOSIS — S335XXA Sprain of ligaments of lumbar spine, initial encounter: Secondary | ICD-10-CM | POA: Diagnosis not present

## 2013-05-25 DIAGNOSIS — M999 Biomechanical lesion, unspecified: Secondary | ICD-10-CM | POA: Diagnosis not present

## 2013-05-25 DIAGNOSIS — S239XXA Sprain of unspecified parts of thorax, initial encounter: Secondary | ICD-10-CM | POA: Diagnosis not present

## 2013-05-25 DIAGNOSIS — M9981 Other biomechanical lesions of cervical region: Secondary | ICD-10-CM | POA: Diagnosis not present

## 2013-05-25 DIAGNOSIS — S139XXA Sprain of joints and ligaments of unspecified parts of neck, initial encounter: Secondary | ICD-10-CM | POA: Diagnosis not present

## 2013-05-27 DIAGNOSIS — S139XXA Sprain of joints and ligaments of unspecified parts of neck, initial encounter: Secondary | ICD-10-CM | POA: Diagnosis not present

## 2013-05-27 DIAGNOSIS — M9981 Other biomechanical lesions of cervical region: Secondary | ICD-10-CM | POA: Diagnosis not present

## 2013-05-27 DIAGNOSIS — S335XXA Sprain of ligaments of lumbar spine, initial encounter: Secondary | ICD-10-CM | POA: Diagnosis not present

## 2013-05-27 DIAGNOSIS — M999 Biomechanical lesion, unspecified: Secondary | ICD-10-CM | POA: Diagnosis not present

## 2013-05-27 DIAGNOSIS — S239XXA Sprain of unspecified parts of thorax, initial encounter: Secondary | ICD-10-CM | POA: Diagnosis not present

## 2013-05-30 ENCOUNTER — Other Ambulatory Visit: Payer: Self-pay | Admitting: Cardiology

## 2013-06-16 ENCOUNTER — Other Ambulatory Visit: Payer: Self-pay

## 2013-06-16 ENCOUNTER — Telehealth: Payer: Self-pay | Admitting: *Deleted

## 2013-06-16 DIAGNOSIS — S239XXA Sprain of unspecified parts of thorax, initial encounter: Secondary | ICD-10-CM | POA: Diagnosis not present

## 2013-06-16 DIAGNOSIS — S335XXA Sprain of ligaments of lumbar spine, initial encounter: Secondary | ICD-10-CM | POA: Diagnosis not present

## 2013-06-16 DIAGNOSIS — M9981 Other biomechanical lesions of cervical region: Secondary | ICD-10-CM | POA: Diagnosis not present

## 2013-06-16 DIAGNOSIS — F419 Anxiety disorder, unspecified: Secondary | ICD-10-CM

## 2013-06-16 DIAGNOSIS — M999 Biomechanical lesion, unspecified: Secondary | ICD-10-CM | POA: Diagnosis not present

## 2013-06-16 DIAGNOSIS — S139XXA Sprain of joints and ligaments of unspecified parts of neck, initial encounter: Secondary | ICD-10-CM | POA: Diagnosis not present

## 2013-06-16 DIAGNOSIS — E7800 Pure hypercholesterolemia, unspecified: Secondary | ICD-10-CM

## 2013-06-16 MED ORDER — EZETIMIBE-SIMVASTATIN 10-20 MG PO TABS: 1.0000 | ORAL_TABLET | Freq: Every day | ORAL | Status: DC

## 2013-06-16 MED ORDER — ALPRAZOLAM 0.5 MG PO TABS: 0.5000 mg | ORAL_TABLET | ORAL | Status: DC | PRN

## 2013-06-16 NOTE — Telephone Encounter (Signed)
Will fax Rx after signed to mail order as requested

## 2013-07-02 DIAGNOSIS — J019 Acute sinusitis, unspecified: Secondary | ICD-10-CM | POA: Diagnosis not present

## 2013-07-15 DIAGNOSIS — D1801 Hemangioma of skin and subcutaneous tissue: Secondary | ICD-10-CM | POA: Diagnosis not present

## 2013-07-15 DIAGNOSIS — L821 Other seborrheic keratosis: Secondary | ICD-10-CM | POA: Diagnosis not present

## 2013-07-15 DIAGNOSIS — L259 Unspecified contact dermatitis, unspecified cause: Secondary | ICD-10-CM | POA: Diagnosis not present

## 2013-07-15 DIAGNOSIS — D239 Other benign neoplasm of skin, unspecified: Secondary | ICD-10-CM | POA: Diagnosis not present

## 2013-07-15 DIAGNOSIS — Z85828 Personal history of other malignant neoplasm of skin: Secondary | ICD-10-CM | POA: Diagnosis not present

## 2013-09-01 ENCOUNTER — Other Ambulatory Visit (INDEPENDENT_AMBULATORY_CARE_PROVIDER_SITE_OTHER): Payer: Medicare Other

## 2013-09-01 DIAGNOSIS — E7800 Pure hypercholesterolemia, unspecified: Secondary | ICD-10-CM

## 2013-09-01 DIAGNOSIS — I119 Hypertensive heart disease without heart failure: Secondary | ICD-10-CM | POA: Diagnosis not present

## 2013-09-01 DIAGNOSIS — E039 Hypothyroidism, unspecified: Secondary | ICD-10-CM

## 2013-09-01 LAB — BASIC METABOLIC PANEL
BUN: 16 mg/dL (ref 6–23)
CHLORIDE: 101 meq/L (ref 96–112)
CO2: 30 mEq/L (ref 19–32)
CREATININE: 1.2 mg/dL (ref 0.4–1.5)
Calcium: 8.8 mg/dL (ref 8.4–10.5)
GFR: 66.4 mL/min (ref >60.00)
Glucose, Bld: 106 mg/dL — ABNORMAL HIGH (ref 70–99)
Potassium: 3.6 mEq/L (ref 3.5–5.1)
Sodium: 137 mEq/L (ref 135–145)

## 2013-09-01 LAB — HEPATIC FUNCTION PANEL
ALBUMIN: 3.3 g/dL — AB (ref 3.5–5.2)
ALK PHOS: 52 U/L (ref 39–117)
ALT: 33 U/L (ref 0–53)
AST: 31 U/L (ref 0–37)
Bilirubin, Direct: 0.1 mg/dL (ref 0.0–0.3)
Total Bilirubin: 1.5 mg/dL — ABNORMAL HIGH (ref 0.2–1.2)
Total Protein: 6.2 g/dL (ref 6.0–8.3)

## 2013-09-01 LAB — TSH: TSH: 0.91 u[IU]/mL (ref 0.35–4.50)

## 2013-09-01 LAB — LIPID PANEL
CHOL/HDL RATIO: 3
Cholesterol: 196 mg/dL (ref 0–200)
HDL: 77 mg/dL (ref >39.00)
LDL Cholesterol: 96 mg/dL (ref 0–99)
Triglycerides: 116 mg/dL (ref 0.0–149.0)
VLDL: 23.2 mg/dL (ref 0.0–40.0)

## 2013-09-01 NOTE — Progress Notes (Signed)
Quick Note:  Please make copy of labs for patient visit. ______ 

## 2013-09-02 ENCOUNTER — Other Ambulatory Visit: Payer: Medicare Other

## 2013-09-08 ENCOUNTER — Encounter: Payer: Self-pay | Admitting: Cardiology

## 2013-09-08 ENCOUNTER — Other Ambulatory Visit: Payer: Self-pay | Admitting: *Deleted

## 2013-09-08 ENCOUNTER — Encounter (INDEPENDENT_AMBULATORY_CARE_PROVIDER_SITE_OTHER): Payer: Self-pay

## 2013-09-08 ENCOUNTER — Ambulatory Visit (INDEPENDENT_AMBULATORY_CARE_PROVIDER_SITE_OTHER): Payer: Medicare Other | Admitting: Cardiology

## 2013-09-08 VITALS — BP 134/74 | HR 63 | Ht 72.0 in | Wt 205.0 lb

## 2013-09-08 DIAGNOSIS — I493 Ventricular premature depolarization: Secondary | ICD-10-CM

## 2013-09-08 DIAGNOSIS — I10 Essential (primary) hypertension: Secondary | ICD-10-CM | POA: Diagnosis not present

## 2013-09-08 DIAGNOSIS — E7800 Pure hypercholesterolemia, unspecified: Secondary | ICD-10-CM

## 2013-09-08 DIAGNOSIS — I119 Hypertensive heart disease without heart failure: Secondary | ICD-10-CM

## 2013-09-08 DIAGNOSIS — E039 Hypothyroidism, unspecified: Secondary | ICD-10-CM

## 2013-09-08 DIAGNOSIS — I4949 Other premature depolarization: Secondary | ICD-10-CM

## 2013-09-08 NOTE — Assessment & Plan Note (Signed)
His cholesterol has been trending upward.  He had gained 14 pounds at his previous visit and has lost 4 of those pounds today.  He needs to continue to work harder on weight loss.

## 2013-09-08 NOTE — Patient Instructions (Signed)
Your physician recommends that you continue on your current medications as directed. Please refer to the Current Medication list given to you today.  Your physician wants you to follow-up in: 4 months with fasting labs (lp/bmet/hfp) You will receive a reminder letter in the mail two months in advance. If you don't receive a letter, please call our office to schedule the follow-up appointment.  

## 2013-09-08 NOTE — Assessment & Plan Note (Signed)
Blood pressure was remaining stable on current therapy.  No chest pain.  No dizziness or syncope.  No shortness of breath.

## 2013-09-08 NOTE — Assessment & Plan Note (Signed)
The patient continues to limit his intake of caffeine.  He has just one cup of regular coffee each morning.  He has not been troubled with any recent symptomatic PVCs.

## 2013-09-08 NOTE — Progress Notes (Signed)
Carlos Juarez Date of Birth:  Oct 31, 1940 [facility] [address] [place], [place]  [address] [phone]        Fax   [phone]   History of Present Illness: This pleasant 73 year old gentleman is seen for a scheduled followup office visit. He has a history of essential hypertension and a history of hypercholesterolemia. He does not have any history of ischemic heart disease. He had a normal treadmill Cardiolite stress test on 07/24/05 which showed no ischemia and his ejection fraction was 57% with no wall motion abnormalities. He also has a history of hypothyroid disease. Since last visit he has not had any new cardiovascular symptoms.  He previously injured his left knee and had surgery for a torn left meniscus on 03/03/13.  Dr. Berenice Primas is his orthopedic surgeon He has finished rehabilitation and is back working out with his trainer twice a week. Since last visit the patient has lost 4 pounds.   Current Outpatient Prescriptions  Medication Sig Dispense Refill  . ALPRAZolam (XANAX) 0.5 MG tablet Take 1 tablet (0.5 mg total) by mouth as needed for anxiety.  90 tablet  1  . aspirin 81 MG tablet Take 81 mg by mouth daily.        Marland Kitchen atenolol (TENORMIN) 50 MG tablet Take 50 mg by mouth daily.        . Coenzyme Q10 (COQ10 PO) Take by mouth daily.        Marland Kitchen ezetimibe-simvastatin (VYTORIN) 10-20 MG per tablet Take 1 tablet by mouth at bedtime.  90 tablet  3  . fish oil-omega-3 fatty acids 1000 MG capsule Take 2 g by mouth daily. Taking liquid 1 tsp daily      . levothyroxine (SYNTHROID, LEVOTHROID) 150 MCG tablet Take 150 mcg by mouth daily.         No current facility-administered medications for this visit.    Allergies  Allergen Reactions  . Epinephrine     Patient Active Problem List   Diagnosis Date Noted  . Hypercholesteremia     Priority: High  . 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 HTN (hypertension)   . Hypothyroidism   . PVC's (premature ventricular  contractions)     History  Smoking status  . Former Smoker  Smokeless tobacco  . Not on file    History  Alcohol Use: Not on file    No family history on file.  Review of Systems: Constitutional: no fever chills diaphoresis or fatigue or change in weight.  Head and neck: no hearing loss, no epistaxis, no photophobia or visual disturbance. Respiratory: No cough, shortness of breath or wheezing. Cardiovascular: No chest pain peripheral edema, palpitations. Gastrointestinal: No abdominal distention, no abdominal pain, no change in bowel habits hematochezia or melena. Genitourinary: No dysuria, no frequency, no urgency, no nocturia. Musculoskeletal:No arthralgias, no back pain, no gait disturbance or myalgias. Neurological: No dizziness, no headaches, no numbness, no seizures, no syncope, no weakness, no tremors. Hematologic: No lymphadenopathy, no easy bruising. Psychiatric: No confusion, no hallucinations, no sleep disturbance.    Physical Exam: Filed Vitals:   09/08/13 1527  BP: 134/74  Pulse: 63   the general appearance reveals a well-developed well-nourished gentleman in no distress.The head and neck exam reveals pupils equal and reactive.  Extraocular movements are full.  There is no scleral icterus.  The mouth and pharynx are normal.  The neck is supple.  The carotids reveal no bruits.  The jugular venous pressure is normal.  The  thyroid is not enlarged.  There is no lymphadenopathy.  The chest is clear to percussion and auscultation.  There are no rales or rhonchi.  Expansion of the chest is symmetrical.  The precordium is quiet.  The first heart sound is normal.  The second heart sound is physiologically split.  There is no murmur gallop rub or click.  There is no abnormal lift or heave.  The abdomen is soft and nontender.  The bowel sounds are normal.  The liver and spleen are not enlarged.  There are no abdominal masses.  There are no abdominal bruits.  Extremities reveal  good pedal pulses.  There is no phlebitis or edema.  There is no cyanosis or clubbing.  Strength is normal and symmetrical in all extremities.  There is no lateralizing weakness.  There are no sensory deficits.  The skin is warm and dry.  There is no rash.    Assessment / Plan: 1.  Benign hypertensive heart disease without heart failure. 2. Hypercholesterolemia 3. benign PVCs 4. Hypothyroidism  The patient is to work harder on weight loss.  Continue regular exercise with his trainer.  Recheck in 4 months for office visit and fasting lipid panel hepatic function panel and basal metabolic panel

## 2014-01-07 ENCOUNTER — Other Ambulatory Visit (INDEPENDENT_AMBULATORY_CARE_PROVIDER_SITE_OTHER): Payer: Medicare Other

## 2014-01-07 DIAGNOSIS — I119 Hypertensive heart disease without heart failure: Secondary | ICD-10-CM

## 2014-01-07 DIAGNOSIS — E7800 Pure hypercholesterolemia, unspecified: Secondary | ICD-10-CM | POA: Diagnosis not present

## 2014-01-07 LAB — LIPID PANEL
CHOLESTEROL: 201 mg/dL — AB (ref 0–200)
HDL: 80.9 mg/dL (ref >39.00)
LDL Cholesterol: 96 mg/dL (ref 0–99)
NonHDL: 120.1
Total CHOL/HDL Ratio: 2
Triglycerides: 120 mg/dL (ref 0.0–149.0)
VLDL: 24 mg/dL (ref 0.0–40.0)

## 2014-01-07 LAB — BASIC METABOLIC PANEL
BUN: 15 mg/dL (ref 6–23)
CHLORIDE: 103 meq/L (ref 96–112)
CO2: 30 mEq/L (ref 19–32)
Calcium: 8.9 mg/dL (ref 8.4–10.5)
Creatinine, Ser: 1.1 mg/dL (ref 0.4–1.5)
GFR: 72.88 mL/min (ref >60.00)
GLUCOSE: 113 mg/dL — AB (ref 70–99)
POTASSIUM: 3.8 meq/L (ref 3.5–5.1)
SODIUM: 137 meq/L (ref 135–145)

## 2014-01-07 LAB — HEPATIC FUNCTION PANEL
ALT: 37 U/L (ref 0–53)
AST: 30 U/L (ref 0–37)
Albumin: 3.3 g/dL — ABNORMAL LOW (ref 3.5–5.2)
Alkaline Phosphatase: 56 U/L (ref 39–117)
Bilirubin, Direct: 0.2 mg/dL (ref 0.0–0.3)
Total Bilirubin: 1.7 mg/dL — ABNORMAL HIGH (ref 0.2–1.2)
Total Protein: 6.2 g/dL (ref 6.0–8.3)

## 2014-01-07 NOTE — Progress Notes (Signed)
Quick Note:  Please make copy of labs for patient visit. ______ 

## 2014-01-13 ENCOUNTER — Encounter: Payer: Self-pay | Admitting: Cardiology

## 2014-01-13 ENCOUNTER — Ambulatory Visit (INDEPENDENT_AMBULATORY_CARE_PROVIDER_SITE_OTHER): Payer: Medicare Other | Admitting: Cardiology

## 2014-01-13 VITALS — BP 122/88 | HR 54 | Ht 71.0 in | Wt 206.0 lb

## 2014-01-13 DIAGNOSIS — I10 Essential (primary) hypertension: Secondary | ICD-10-CM | POA: Diagnosis not present

## 2014-01-13 DIAGNOSIS — E7800 Pure hypercholesterolemia, unspecified: Secondary | ICD-10-CM

## 2014-01-13 DIAGNOSIS — E034 Atrophy of thyroid (acquired): Secondary | ICD-10-CM

## 2014-01-13 DIAGNOSIS — E038 Other specified hypothyroidism: Secondary | ICD-10-CM

## 2014-01-13 DIAGNOSIS — I119 Hypertensive heart disease without heart failure: Secondary | ICD-10-CM | POA: Diagnosis not present

## 2014-01-13 DIAGNOSIS — F419 Anxiety disorder, unspecified: Secondary | ICD-10-CM

## 2014-01-13 MED ORDER — ALPRAZOLAM 0.5 MG PO TABS: 0.5000 mg | ORAL_TABLET | ORAL | Status: DC | PRN

## 2014-01-13 MED ORDER — EZETIMIBE-SIMVASTATIN 10-20 MG PO TABS: 1.0000 | ORAL_TABLET | Freq: Every day | ORAL | Status: DC

## 2014-01-13 NOTE — Assessment & Plan Note (Signed)
The patient is on Vytorin.  He has not had any myalgias.  Lab work is satisfactory.

## 2014-01-13 NOTE — Progress Notes (Signed)
Carlos Juarez Date of Birth:  11-12-1940 [facility] [address] [place], [place]  [address] [phone]        Fax   [phone]   History of Present Illness: This pleasant 73 year old gentleman is seen for a scheduled followup office visit. He has a history of essential hypertension and a history of hypercholesterolemia. He does not have any history of ischemic heart disease. He had a normal treadmill Cardiolite stress test on 07/24/05 which showed no ischemia and his ejection fraction was 57% with no wall motion abnormalities. He also has a history of hypothyroid disease. Since last visit he has not had any new cardiovascular symptoms.  He previously injured his left knee and had surgery for a torn left meniscus on 03/03/13.  Dr. Berenice Primas is his orthopedic surgeon.  His left knee is doing well but now he is having problem with his right knee.  He works out with his trainer twice a week. Since last visit the patient has gained 1 pound. His blood pressure was slightly high today and was unchanged on recheck.  He has not been monitoring his blood pressures at home.  He does try to avoid added salt   Current Outpatient Prescriptions  Medication Sig Dispense Refill  . ALPRAZolam (XANAX) 0.5 MG tablet Take 1 tablet (0.5 mg total) by mouth as needed for anxiety.  90 tablet  1  . aspirin 81 MG tablet Take 81 mg by mouth daily.        Marland Kitchen atenolol (TENORMIN) 50 MG tablet Take 50 mg by mouth daily.        . Coenzyme Q10 (COQ10 PO) Take by mouth daily.        Marland Kitchen ezetimibe-simvastatin (VYTORIN) 10-20 MG per tablet Take 1 tablet by mouth at bedtime.  90 tablet  3  . fish oil-omega-3 fatty acids 1000 MG capsule Take 2 g by mouth daily. Taking liquid 1 tsp daily      . levothyroxine (SYNTHROID, LEVOTHROID) 150 MCG tablet Take 150 mcg by mouth daily.         No current facility-administered medications for this visit.    Allergies  Allergen Reactions  . Epinephrine      Patient Active Problem List   Diagnosis Date Noted  . Hypercholesteremia     Priority: High  . 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 HTN (hypertension)   . Hypothyroidism   . PVC's (premature ventricular contractions)     History  Smoking status  . Former Smoker  Smokeless tobacco  . Not on file    History  Alcohol Use: Not on file    No family history on file.  Review of Systems: Constitutional: no fever chills diaphoresis or fatigue or change in weight.  Head and neck: no hearing loss, no epistaxis, no photophobia or visual disturbance. Respiratory: No cough, shortness of breath or wheezing. Cardiovascular: No chest pain peripheral edema, palpitations. Gastrointestinal: No abdominal distention, no abdominal pain, no change in bowel habits hematochezia or melena. Genitourinary: No dysuria, no frequency, no urgency, no nocturia. Musculoskeletal:No arthralgias, no back pain, no gait disturbance or myalgias. Neurological: No dizziness, no headaches, no numbness, no seizures, no syncope, no weakness, no tremors. Hematologic: No lymphadenopathy, no easy bruising. Psychiatric: No confusion, no hallucinations, no sleep disturbance.    Physical Exam: Filed Vitals:   01/13/14 0914  BP: 122/88  Pulse: 54   the general appearance reveals a well-developed well-nourished gentleman in no distress.The head and  neck exam reveals pupils equal and reactive.  Extraocular movements are full.  There is no scleral icterus.  The mouth and pharynx are normal.  The neck is supple.  The carotids reveal no bruits.  The jugular venous pressure is normal.  The  thyroid is not enlarged.  There is no lymphadenopathy.  The chest is clear to percussion and auscultation.  There are no rales or rhonchi.  Expansion of the chest is symmetrical.  The precordium is quiet.  The first heart sound is normal.  The second heart sound is physiologically split.  There is no murmur gallop rub or click.  There is no  abnormal lift or heave.  The abdomen is soft and nontender.  The bowel sounds are normal.  The liver and spleen are not enlarged.  There are no abdominal masses.  There are no abdominal bruits.  Extremities reveal good pedal pulses.  There is no phlebitis or edema.  There is no cyanosis or clubbing.  Strength is normal and symmetrical in all extremities.  There is no lateralizing weakness.  There are no sensory deficits.  The skin is warm and dry.  There is no rash.  EKG today shows sinus bradycardia and is otherwise within normal limits.  No PVCs are seen. Assessment / Plan: 1.  Benign hypertensive heart disease without heart failure. 2. Hypercholesterolemia 3. benign PVCs 4. Hypothyroidism  The patient is to work harder on weight loss.  Continue regular exercise with his trainer.  Recheck in 4 months for office visit and fasting lipid panel hepatic function panel and basal metabolic panel.  Obtain a home blood pressure cuff and keep a check on his blood pressures.

## 2014-01-13 NOTE — Assessment & Plan Note (Signed)
Blood pressure is higher today.  He will monitor his blood pressure.  He will obtain an [employer] blood pressure cuff for home use.  If pressure remains high at home, he will let us know and we will consider adjusting his blood pressure medication regimen.  He is not having any headaches or dizziness.  He feels well.

## 2014-01-13 NOTE — Patient Instructions (Signed)
Your physician recommends that you continue on your current medications as directed. Please refer to the Current Medication list given to you today.  Your physician wants you to follow-up in: 4 months with fasting labs (lp/bmet/hfp/tsh/ft4)  You will receive a reminder letter in the mail two months in advance. If you don't receive a letter, please call our office to schedule the follow-up appointment.   Get OMRON blood pressure cuff and monitor at home

## 2014-01-13 NOTE — Assessment & Plan Note (Signed)
He is clinically euthyroid on current thyroid supplementation.  We will plan to recheck his thyroid function at his next office visit.

## 2014-02-03 DIAGNOSIS — Z23 Encounter for immunization: Secondary | ICD-10-CM | POA: Diagnosis not present

## 2014-02-16 DIAGNOSIS — S233XXA Sprain of ligaments of thoracic spine, initial encounter: Secondary | ICD-10-CM | POA: Diagnosis not present

## 2014-02-16 DIAGNOSIS — M9902 Segmental and somatic dysfunction of thoracic region: Secondary | ICD-10-CM | POA: Diagnosis not present

## 2014-02-16 DIAGNOSIS — S134XXA Sprain of ligaments of cervical spine, initial encounter: Secondary | ICD-10-CM | POA: Diagnosis not present

## 2014-02-16 DIAGNOSIS — S335XXA Sprain of ligaments of lumbar spine, initial encounter: Secondary | ICD-10-CM | POA: Diagnosis not present

## 2014-02-16 DIAGNOSIS — M9903 Segmental and somatic dysfunction of lumbar region: Secondary | ICD-10-CM | POA: Diagnosis not present

## 2014-02-16 DIAGNOSIS — M9901 Segmental and somatic dysfunction of cervical region: Secondary | ICD-10-CM | POA: Diagnosis not present

## 2014-02-17 DIAGNOSIS — S335XXA Sprain of ligaments of lumbar spine, initial encounter: Secondary | ICD-10-CM | POA: Diagnosis not present

## 2014-02-17 DIAGNOSIS — M9903 Segmental and somatic dysfunction of lumbar region: Secondary | ICD-10-CM | POA: Diagnosis not present

## 2014-02-17 DIAGNOSIS — S134XXA Sprain of ligaments of cervical spine, initial encounter: Secondary | ICD-10-CM | POA: Diagnosis not present

## 2014-02-17 DIAGNOSIS — M9902 Segmental and somatic dysfunction of thoracic region: Secondary | ICD-10-CM | POA: Diagnosis not present

## 2014-02-17 DIAGNOSIS — M9901 Segmental and somatic dysfunction of cervical region: Secondary | ICD-10-CM | POA: Diagnosis not present

## 2014-02-17 DIAGNOSIS — S233XXA Sprain of ligaments of thoracic spine, initial encounter: Secondary | ICD-10-CM | POA: Diagnosis not present

## 2014-02-21 DIAGNOSIS — M9902 Segmental and somatic dysfunction of thoracic region: Secondary | ICD-10-CM | POA: Diagnosis not present

## 2014-02-21 DIAGNOSIS — S233XXA Sprain of ligaments of thoracic spine, initial encounter: Secondary | ICD-10-CM | POA: Diagnosis not present

## 2014-02-21 DIAGNOSIS — M9903 Segmental and somatic dysfunction of lumbar region: Secondary | ICD-10-CM | POA: Diagnosis not present

## 2014-02-21 DIAGNOSIS — M9901 Segmental and somatic dysfunction of cervical region: Secondary | ICD-10-CM | POA: Diagnosis not present

## 2014-02-21 DIAGNOSIS — S335XXA Sprain of ligaments of lumbar spine, initial encounter: Secondary | ICD-10-CM | POA: Diagnosis not present

## 2014-02-21 DIAGNOSIS — S134XXA Sprain of ligaments of cervical spine, initial encounter: Secondary | ICD-10-CM | POA: Diagnosis not present

## 2014-02-23 DIAGNOSIS — H01001 Unspecified blepharitis right upper eyelid: Secondary | ICD-10-CM | POA: Diagnosis not present

## 2014-02-23 DIAGNOSIS — M9901 Segmental and somatic dysfunction of cervical region: Secondary | ICD-10-CM | POA: Diagnosis not present

## 2014-02-23 DIAGNOSIS — S134XXA Sprain of ligaments of cervical spine, initial encounter: Secondary | ICD-10-CM | POA: Diagnosis not present

## 2014-02-23 DIAGNOSIS — M9902 Segmental and somatic dysfunction of thoracic region: Secondary | ICD-10-CM | POA: Diagnosis not present

## 2014-02-23 DIAGNOSIS — H43813 Vitreous degeneration, bilateral: Secondary | ICD-10-CM | POA: Diagnosis not present

## 2014-02-23 DIAGNOSIS — S335XXA Sprain of ligaments of lumbar spine, initial encounter: Secondary | ICD-10-CM | POA: Diagnosis not present

## 2014-02-23 DIAGNOSIS — H2513 Age-related nuclear cataract, bilateral: Secondary | ICD-10-CM | POA: Diagnosis not present

## 2014-02-23 DIAGNOSIS — M9903 Segmental and somatic dysfunction of lumbar region: Secondary | ICD-10-CM | POA: Diagnosis not present

## 2014-02-23 DIAGNOSIS — H01004 Unspecified blepharitis left upper eyelid: Secondary | ICD-10-CM | POA: Diagnosis not present

## 2014-02-23 DIAGNOSIS — S233XXA Sprain of ligaments of thoracic spine, initial encounter: Secondary | ICD-10-CM | POA: Diagnosis not present

## 2014-03-01 DIAGNOSIS — M9902 Segmental and somatic dysfunction of thoracic region: Secondary | ICD-10-CM | POA: Diagnosis not present

## 2014-03-01 DIAGNOSIS — M9903 Segmental and somatic dysfunction of lumbar region: Secondary | ICD-10-CM | POA: Diagnosis not present

## 2014-03-01 DIAGNOSIS — S335XXA Sprain of ligaments of lumbar spine, initial encounter: Secondary | ICD-10-CM | POA: Diagnosis not present

## 2014-03-01 DIAGNOSIS — S134XXA Sprain of ligaments of cervical spine, initial encounter: Secondary | ICD-10-CM | POA: Diagnosis not present

## 2014-03-01 DIAGNOSIS — M9901 Segmental and somatic dysfunction of cervical region: Secondary | ICD-10-CM | POA: Diagnosis not present

## 2014-03-01 DIAGNOSIS — S233XXA Sprain of ligaments of thoracic spine, initial encounter: Secondary | ICD-10-CM | POA: Diagnosis not present

## 2014-03-02 DIAGNOSIS — M9903 Segmental and somatic dysfunction of lumbar region: Secondary | ICD-10-CM | POA: Diagnosis not present

## 2014-03-02 DIAGNOSIS — S134XXA Sprain of ligaments of cervical spine, initial encounter: Secondary | ICD-10-CM | POA: Diagnosis not present

## 2014-03-02 DIAGNOSIS — S335XXA Sprain of ligaments of lumbar spine, initial encounter: Secondary | ICD-10-CM | POA: Diagnosis not present

## 2014-03-02 DIAGNOSIS — S233XXA Sprain of ligaments of thoracic spine, initial encounter: Secondary | ICD-10-CM | POA: Diagnosis not present

## 2014-03-02 DIAGNOSIS — M9902 Segmental and somatic dysfunction of thoracic region: Secondary | ICD-10-CM | POA: Diagnosis not present

## 2014-03-02 DIAGNOSIS — M9901 Segmental and somatic dysfunction of cervical region: Secondary | ICD-10-CM | POA: Diagnosis not present

## 2014-03-07 DIAGNOSIS — S233XXA Sprain of ligaments of thoracic spine, initial encounter: Secondary | ICD-10-CM | POA: Diagnosis not present

## 2014-03-07 DIAGNOSIS — S134XXA Sprain of ligaments of cervical spine, initial encounter: Secondary | ICD-10-CM | POA: Diagnosis not present

## 2014-03-07 DIAGNOSIS — M9901 Segmental and somatic dysfunction of cervical region: Secondary | ICD-10-CM | POA: Diagnosis not present

## 2014-03-07 DIAGNOSIS — M9902 Segmental and somatic dysfunction of thoracic region: Secondary | ICD-10-CM | POA: Diagnosis not present

## 2014-03-07 DIAGNOSIS — M9903 Segmental and somatic dysfunction of lumbar region: Secondary | ICD-10-CM | POA: Diagnosis not present

## 2014-03-07 DIAGNOSIS — S335XXA Sprain of ligaments of lumbar spine, initial encounter: Secondary | ICD-10-CM | POA: Diagnosis not present

## 2014-03-17 DIAGNOSIS — S134XXA Sprain of ligaments of cervical spine, initial encounter: Secondary | ICD-10-CM | POA: Diagnosis not present

## 2014-03-17 DIAGNOSIS — S233XXA Sprain of ligaments of thoracic spine, initial encounter: Secondary | ICD-10-CM | POA: Diagnosis not present

## 2014-03-17 DIAGNOSIS — M9902 Segmental and somatic dysfunction of thoracic region: Secondary | ICD-10-CM | POA: Diagnosis not present

## 2014-03-17 DIAGNOSIS — S335XXA Sprain of ligaments of lumbar spine, initial encounter: Secondary | ICD-10-CM | POA: Diagnosis not present

## 2014-03-17 DIAGNOSIS — M9903 Segmental and somatic dysfunction of lumbar region: Secondary | ICD-10-CM | POA: Diagnosis not present

## 2014-03-17 DIAGNOSIS — M9901 Segmental and somatic dysfunction of cervical region: Secondary | ICD-10-CM | POA: Diagnosis not present

## 2014-03-23 DIAGNOSIS — S335XXA Sprain of ligaments of lumbar spine, initial encounter: Secondary | ICD-10-CM | POA: Diagnosis not present

## 2014-03-23 DIAGNOSIS — M9902 Segmental and somatic dysfunction of thoracic region: Secondary | ICD-10-CM | POA: Diagnosis not present

## 2014-03-23 DIAGNOSIS — S134XXA Sprain of ligaments of cervical spine, initial encounter: Secondary | ICD-10-CM | POA: Diagnosis not present

## 2014-03-23 DIAGNOSIS — M9903 Segmental and somatic dysfunction of lumbar region: Secondary | ICD-10-CM | POA: Diagnosis not present

## 2014-03-23 DIAGNOSIS — S233XXA Sprain of ligaments of thoracic spine, initial encounter: Secondary | ICD-10-CM | POA: Diagnosis not present

## 2014-03-23 DIAGNOSIS — M9901 Segmental and somatic dysfunction of cervical region: Secondary | ICD-10-CM | POA: Diagnosis not present

## 2014-03-31 DIAGNOSIS — S233XXA Sprain of ligaments of thoracic spine, initial encounter: Secondary | ICD-10-CM | POA: Diagnosis not present

## 2014-03-31 DIAGNOSIS — M9902 Segmental and somatic dysfunction of thoracic region: Secondary | ICD-10-CM | POA: Diagnosis not present

## 2014-03-31 DIAGNOSIS — M9901 Segmental and somatic dysfunction of cervical region: Secondary | ICD-10-CM | POA: Diagnosis not present

## 2014-03-31 DIAGNOSIS — M9903 Segmental and somatic dysfunction of lumbar region: Secondary | ICD-10-CM | POA: Diagnosis not present

## 2014-03-31 DIAGNOSIS — S335XXA Sprain of ligaments of lumbar spine, initial encounter: Secondary | ICD-10-CM | POA: Diagnosis not present

## 2014-03-31 DIAGNOSIS — S134XXA Sprain of ligaments of cervical spine, initial encounter: Secondary | ICD-10-CM | POA: Diagnosis not present

## 2014-04-07 DIAGNOSIS — M1712 Unilateral primary osteoarthritis, left knee: Secondary | ICD-10-CM | POA: Diagnosis not present

## 2014-04-07 DIAGNOSIS — M1711 Unilateral primary osteoarthritis, right knee: Secondary | ICD-10-CM | POA: Diagnosis not present

## 2014-05-24 ENCOUNTER — Other Ambulatory Visit (INDEPENDENT_AMBULATORY_CARE_PROVIDER_SITE_OTHER): Payer: Medicare Other | Admitting: *Deleted

## 2014-05-24 DIAGNOSIS — I119 Hypertensive heart disease without heart failure: Secondary | ICD-10-CM

## 2014-05-24 DIAGNOSIS — E034 Atrophy of thyroid (acquired): Secondary | ICD-10-CM

## 2014-05-24 DIAGNOSIS — E7800 Pure hypercholesterolemia, unspecified: Secondary | ICD-10-CM

## 2014-05-24 DIAGNOSIS — E038 Other specified hypothyroidism: Secondary | ICD-10-CM | POA: Diagnosis not present

## 2014-05-24 LAB — LIPID PANEL
Cholesterol: 166 mg/dL (ref 0–200)
HDL: 71.2 mg/dL
LDL Cholesterol: 75 mg/dL (ref 0–99)
NonHDL: 94.8
Total CHOL/HDL Ratio: 2
Triglycerides: 98 mg/dL (ref 0.0–149.0)
VLDL: 19.6 mg/dL (ref 0.0–40.0)

## 2014-05-24 LAB — BASIC METABOLIC PANEL WITH GFR
BUN: 16 mg/dL (ref 6–23)
CO2: 31 meq/L (ref 19–32)
Calcium: 8.8 mg/dL (ref 8.4–10.5)
Chloride: 103 meq/L (ref 96–112)
Creatinine, Ser: 1.07 mg/dL (ref 0.40–1.50)
GFR: 72.02 mL/min
Glucose, Bld: 112 mg/dL — ABNORMAL HIGH (ref 70–99)
Potassium: 3.7 meq/L (ref 3.5–5.1)
Sodium: 139 meq/L (ref 135–145)

## 2014-05-24 LAB — HEPATIC FUNCTION PANEL
ALT: 32 U/L (ref 0–53)
AST: 27 U/L (ref 0–37)
Albumin: 3.4 g/dL — ABNORMAL LOW (ref 3.5–5.2)
Alkaline Phosphatase: 49 U/L (ref 39–117)
Bilirubin, Direct: 0.2 mg/dL (ref 0.0–0.3)
Total Bilirubin: 1 mg/dL (ref 0.2–1.2)
Total Protein: 5.8 g/dL — ABNORMAL LOW (ref 6.0–8.3)

## 2014-05-24 LAB — T4, FREE: FREE T4: 1.4 ng/dL (ref 0.60–1.60)

## 2014-05-24 LAB — TSH: TSH: 0.85 u[IU]/mL (ref 0.35–4.50)

## 2014-05-24 NOTE — Addendum Note (Signed)
Addended by: Eulis Foster on: 05/24/2014 08:35 AM   Modules accepted: Orders

## 2014-05-24 NOTE — Progress Notes (Signed)
Quick Note:  Please make copy of labs for patient visit. ______ 

## 2014-05-26 ENCOUNTER — Ambulatory Visit (INDEPENDENT_AMBULATORY_CARE_PROVIDER_SITE_OTHER): Payer: Medicare Other | Admitting: Cardiology

## 2014-05-26 ENCOUNTER — Encounter: Payer: Self-pay | Admitting: Cardiology

## 2014-05-26 VITALS — BP 124/86 | HR 56 | Ht 71.0 in | Wt 211.0 lb

## 2014-05-26 DIAGNOSIS — F419 Anxiety disorder, unspecified: Secondary | ICD-10-CM

## 2014-05-26 DIAGNOSIS — E038 Other specified hypothyroidism: Secondary | ICD-10-CM

## 2014-05-26 DIAGNOSIS — E7800 Pure hypercholesterolemia, unspecified: Secondary | ICD-10-CM

## 2014-05-26 DIAGNOSIS — I10 Essential (primary) hypertension: Secondary | ICD-10-CM

## 2014-05-26 MED ORDER — ALPRAZOLAM 0.5 MG PO TABS: 0.5000 mg | ORAL_TABLET | Freq: Every day | ORAL | Status: DC | PRN

## 2014-05-26 NOTE — Progress Notes (Signed)
Cardiology Office Note   Date:  05/26/2014   ID:  Carlos, Juarez [birth year], MRN [id number]  PCP:  No primary care provider on file.  Cardiologist:   Darlin Coco, MD   No chief complaint on file.     History of Present Illness: Carlos Juarez is a 74 y.o. male who presents for scheduled four-month follow-up office visit  This pleasant 74 year old gentleman is seen for a scheduled followup office visit. He has a history of essential hypertension and a history of hypercholesterolemia. He does not have any history of ischemic heart disease. He had a normal treadmill Cardiolite stress test on 07/24/05 which showed no ischemia and his ejection fraction was 57% with no wall motion abnormalities. He also has a history of hypothyroid disease. Since last visit he has not had any new cardiovascular symptoms.  He has had some orthopedic problems.  Just before Christmas he had to have injections of steroids into both knees.  He had good symptomatic response but now the injection is wearing off and is having pain with walking again.  Dr. Dorna Leitz is his orthopedist. The patient has gained 5 pounds since last visit.  He and his wife just returned from a two-week cruise in the [place] on [place].  Past Medical History  Diagnosis Date  . HTN (hypertension)   . Hypercholesteremia   . Hypothyroidism   . PVC's (premature ventricular contractions)     Past Surgical History  Procedure Laterality Date  . Hernia repair       Current Outpatient Prescriptions  Medication Sig Dispense Refill  . ALPRAZolam (XANAX) 0.5 MG tablet Take 1 tablet (0.5 mg total) by mouth daily as needed. 90 tablet 1  . aspirin 81 MG tablet Take 81 mg by mouth daily.      Marland Kitchen atenolol (TENORMIN) 50 MG tablet Take 50 mg by mouth daily.      . Coenzyme Q10 (COQ10 PO) Take by mouth daily.      Marland Kitchen ezetimibe-simvastatin (VYTORIN) 10-20 MG per tablet Take 1 tablet by mouth at bedtime. 90 tablet 3    . fish oil-omega-3 fatty acids 1000 MG capsule Take 2 g by mouth daily. Taking liquid 1 tsp daily    . levothyroxine (SYNTHROID, LEVOTHROID) 150 MCG tablet Take 150 mcg by mouth daily.       No current facility-administered medications for this visit.    Allergies:   Epinephrine    Social History:  The patient  reports that he has quit smoking. He does not have any smokeless tobacco history on file.   Family History:  The patient's family history.Father died MI age 23.  Mother died 78 old age.   ROS:  Please see the history of present illness.   Otherwise, review of systems are positive for none.   All other systems are reviewed and negative.    PHYSICAL EXAM: VS:  BP 124/86 mmHg  Pulse 56  Ht 5\' 11"  (1.803 m)  Wt 211 lb (95.709 kg)  BMI 29.44 kg/m2 , BMI Body mass index is 29.44 kg/(m^2). GEN: Well nourished, well developed, in no acute distress HEENT: normal Neck: no JVD, carotid bruits, or masses Cardiac: RRR; no murmurs, rubs, or gallops,no edema  Respiratory:  clear to auscultation bilaterally, normal work of breathing GI: soft, nontender, nondistended, + BS MS: no deformity or atrophy Skin: warm and dry, no rash Neuro:  Strength and sensation are intact Psych: euthymic mood, full affect   EKG:  EKG is not ordered today.    Recent Labs: 05/24/2014: ALT 32; BUN 16; Creatinine 1.07; Potassium 3.7; Sodium 139; TSH 0.85    Lipid Panel    Component Value Date/Time   CHOL 166 05/24/2014 0835   TRIG 98.0 05/24/2014 0835   HDL 71.20 05/24/2014 0835   CHOLHDL 2 05/24/2014 0835   VLDL 19.6 05/24/2014 0835   LDLCALC 75 05/24/2014 0835   LDLDIRECT 103.7 01/03/2012 0832      Wt Readings from Last 3 Encounters:  05/26/14 211 lb (95.709 kg)  01/13/14 206 lb (93.441 kg)  09/08/13 205 lb (92.987 kg)        ASSESSMENT AND PLAN:  1. Benign hypertensive heart disease without heart failure. 2. Hypercholesterolemia 3. 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 4.  Hypothyroidism  We reviewed his recent labs which are good even though he gained 5 pounds and had been on any recent cruise. Current medicines are reviewed at length with the patient today.  The patient does not have concerns regarding medicines.  The following changes have been made:  no change  Labs/ tests ordered today include:   Orders Placed This Encounter  Procedures  . Lipid panel  . Hepatic function panel  . Basic metabolic panel  . TSH  . T4, free     Disposition:   FU with Dr. Mare Ferrari in 4 months for office visit lipid panel hepatic function panel basal metabolic panel TSH and free T4. Work on weight loss.   Signed, Darlin Coco, MD  05/26/2014 5:22 PM    [facility] [place], [place], [place]  [address] Phone: [phone]; Fax: [phone]

## 2014-05-26 NOTE — Patient Instructions (Signed)
Your physician recommends that you continue on your current medications as directed. Please refer to the Current Medication list given to you today.  Your physician recommends that you schedule a follow-up appointment in: 4 months with fasting labs (lp/bmet/hfp/tsh/t4)   Work harder on diet and weight loss

## 2014-05-27 ENCOUNTER — Ambulatory Visit: Payer: Medicare Other | Admitting: Cardiology

## 2014-08-03 DIAGNOSIS — Z85828 Personal history of other malignant neoplasm of skin: Secondary | ICD-10-CM | POA: Diagnosis not present

## 2014-08-03 DIAGNOSIS — D225 Melanocytic nevi of trunk: Secondary | ICD-10-CM | POA: Diagnosis not present

## 2014-08-03 DIAGNOSIS — L821 Other seborrheic keratosis: Secondary | ICD-10-CM | POA: Diagnosis not present

## 2014-08-04 DIAGNOSIS — M1711 Unilateral primary osteoarthritis, right knee: Secondary | ICD-10-CM | POA: Diagnosis not present

## 2014-08-04 DIAGNOSIS — M1712 Unilateral primary osteoarthritis, left knee: Secondary | ICD-10-CM | POA: Diagnosis not present

## 2014-09-23 ENCOUNTER — Other Ambulatory Visit (INDEPENDENT_AMBULATORY_CARE_PROVIDER_SITE_OTHER): Payer: Medicare Other | Admitting: *Deleted

## 2014-09-23 DIAGNOSIS — I10 Essential (primary) hypertension: Secondary | ICD-10-CM | POA: Diagnosis not present

## 2014-09-23 DIAGNOSIS — E038 Other specified hypothyroidism: Secondary | ICD-10-CM | POA: Diagnosis not present

## 2014-09-23 LAB — LIPID PANEL
CHOL/HDL RATIO: 3
Cholesterol: 200 mg/dL (ref 0–200)
HDL: 79.2 mg/dL (ref >39.00)
LDL CALC: 97 mg/dL (ref 0–99)
NonHDL: 120.8
Triglycerides: 121 mg/dL (ref 0.0–149.0)
VLDL: 24.2 mg/dL (ref 0.0–40.0)

## 2014-09-23 LAB — BASIC METABOLIC PANEL
BUN: 16 mg/dL (ref 6–23)
CO2: 28 mEq/L (ref 19–32)
Calcium: 9.1 mg/dL (ref 8.4–10.5)
Chloride: 102 mEq/L (ref 96–112)
Creatinine, Ser: 1.01 mg/dL (ref 0.40–1.50)
GFR: 76.9 mL/min (ref >60.00)
Glucose, Bld: 105 mg/dL — ABNORMAL HIGH (ref 70–99)
Potassium: 3.8 mEq/L (ref 3.5–5.1)
SODIUM: 137 meq/L (ref 135–145)

## 2014-09-23 LAB — HEPATIC FUNCTION PANEL
ALK PHOS: 56 U/L (ref 39–117)
ALT: 30 U/L (ref 0–53)
AST: 25 U/L (ref 0–37)
Albumin: 3.6 g/dL (ref 3.5–5.2)
BILIRUBIN TOTAL: 1.6 mg/dL — AB (ref 0.2–1.2)
Bilirubin, Direct: 0.3 mg/dL (ref 0.0–0.3)
TOTAL PROTEIN: 6.2 g/dL (ref 6.0–8.3)

## 2014-09-23 LAB — TSH: TSH: 0.45 u[IU]/mL (ref 0.35–4.50)

## 2014-09-23 LAB — T4, FREE: Free T4: 1.5 ng/dL (ref 0.60–1.60)

## 2014-09-23 NOTE — Progress Notes (Signed)
Quick Note:  Please make copy of labs for patient visit. ______ 

## 2014-09-30 ENCOUNTER — Other Ambulatory Visit: Payer: Medicare Other

## 2014-10-04 ENCOUNTER — Telehealth: Payer: Self-pay | Admitting: *Deleted

## 2014-10-04 NOTE — Telephone Encounter (Signed)
called for fm hx/status, no answer

## 2014-10-05 ENCOUNTER — Ambulatory Visit (INDEPENDENT_AMBULATORY_CARE_PROVIDER_SITE_OTHER): Payer: Medicare Other | Admitting: Cardiology

## 2014-10-05 ENCOUNTER — Encounter: Payer: Self-pay | Admitting: Cardiology

## 2014-10-05 VITALS — BP 124/80 | HR 58 | Ht 71.5 in | Wt 205.4 lb

## 2014-10-05 DIAGNOSIS — E039 Hypothyroidism, unspecified: Secondary | ICD-10-CM | POA: Diagnosis not present

## 2014-10-05 DIAGNOSIS — I10 Essential (primary) hypertension: Secondary | ICD-10-CM | POA: Diagnosis not present

## 2014-10-05 DIAGNOSIS — E7800 Pure hypercholesterolemia, unspecified: Secondary | ICD-10-CM

## 2014-10-05 NOTE — Patient Instructions (Signed)
Medication Instructions:  Your physician recommends that you continue on your current medications as directed. Please refer to the Current Medication list given to you today.  Labwork: none  Testing/Procedures: none  Follow-Up: Your physician wants you to follow-up in: 4 months with fasting labs (lp/bmet/hfp/tsh/ft4)  You will receive a reminder letter in the mail two months in advance. If you don't receive a letter, please call our office to schedule the follow-up appointment.

## 2014-10-05 NOTE — Progress Notes (Signed)
Cardiology Office Note   Date:  10/05/2014   ID:  Zalen, Sequeira [birth year], MRN [id number]  PCP:  No primary care provider on file.  Cardiologist: Darlin Coco MD  No chief complaint on file.     History of Present Illness: Carlos Juarez is a 74 y.o. male who presents for a four-month follow-up office visit. This pleasant 74 year old gentleman is seen for a scheduled followup office visit. He has a history of essential hypertension and a history of hypercholesterolemia. He does not have any history of ischemic heart disease. He had a normal treadmill Cardiolite stress test on 07/24/05 which showed no ischemia and his ejection fraction was 57% with no wall motion abnormalities. He also has a history of hypothyroid disease. Since last visit he has not had any new cardiovascular symptoms.  He has had some orthopedic problems. He has had previous arthroscopic surgery on the left knee.  He had a good result from that.  He is having more pain in the right knee.  Recently his orthopedist Dr. Dorna Leitz gave him steroids injections into both knees.  This helped considerably.  He has also been having some right hip pain and is seeing a physiotherapist.  He still works out with a trainer twice a week.  His weight is down 6 pounds since last visit.  Past Medical History  Diagnosis Date  . HTN (hypertension)   . Hypercholesteremia   . Hypothyroidism   . PVC's (premature ventricular contractions)     Past Surgical History  Procedure Laterality Date  . Hernia repair       Current Outpatient Prescriptions  Medication Sig Dispense Refill  . ALPRAZolam (XANAX) 0.5 MG tablet Take 0.5 mg by mouth at bedtime as needed for anxiety (sleep).    Marland Kitchen aspirin 81 MG tablet Take 81 mg by mouth daily.      Marland Kitchen atenolol (TENORMIN) 50 MG tablet Take 50 mg by mouth daily.      . Coenzyme Q10 (COQ10 PO) Take 2 mLs by mouth daily.     Marland Kitchen ezetimibe-simvastatin (VYTORIN) 10-20 MG per tablet Take  1 tablet by mouth at bedtime. 90 tablet 3  . fish oil-omega-3 fatty acids 1000 MG capsule Take 2 g by mouth daily. Taking liquid 1 tsp daily    . levothyroxine (SYNTHROID, LEVOTHROID) 150 MCG tablet Take 150 mcg by mouth daily.       No current facility-administered medications for this visit.    Allergies:   Epinephrine    Social History:  The patient  reports that he has quit smoking. He does not have any smokeless tobacco history on file.   Family History:  The patient's family history includes Heart attack (age of onset: 71) in his father; Heart disease in his father; Hypertension in his mother.    ROS:  Please see the history of present illness.   Otherwise, review of systems are positive for none.   All other systems are reviewed and negative.    PHYSICAL EXAM: VS:  BP 124/80 mmHg  Pulse 58  Ht 5' 11.5" (1.816 m)  Wt 205 lb 6.4 oz (93.169 kg)  BMI 28.25 kg/m2 , BMI Body mass index is 28.25 kg/(m^2). GEN: Well nourished, well developed, in no acute distress HEENT: normal Neck: no JVD, carotid bruits, or masses Cardiac: RRR; no murmurs, rubs, or gallops,no edema  Respiratory:  clear to auscultation bilaterally, normal work of breathing GI: soft, nontender, nondistended, + BS MS: no deformity  or atrophy Skin: warm and dry, no rash Neuro:  Strength and sensation are intact Psych: euthymic mood, full affect   EKG:  EKG is not ordered today.    Recent Labs: 09/23/2014: ALT 30; BUN 16; Creatinine, Ser 1.01; Potassium 3.8; Sodium 137; TSH 0.45    Lipid Panel    Component Value Date/Time   CHOL 200 09/23/2014 0825   TRIG 121.0 09/23/2014 0825   HDL 79.20 09/23/2014 0825   CHOLHDL 3 09/23/2014 0825   VLDL 24.2 09/23/2014 0825   LDLCALC 97 09/23/2014 0825   LDLDIRECT 103.7 01/03/2012 0832      Wt Readings from Last 3 Encounters:  10/05/14 205 lb 6.4 oz (93.169 kg)  05/26/14 211 lb (95.709 kg)  01/13/14 206 lb (93.441 kg)         ASSESSMENT AND PLAN:  1.  Benign hypertensive heart disease without heart failure. 2. Hypercholesterolemia 3. 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 4. Hypothyroidism   Current medicines are reviewed at length with the patient today.  The patient does not have concerns regarding medicines.  The following changes have been made:  no change  Labs/ tests ordered today include:  No orders of the defined types were placed in this encounter.     Disposition: Continue current medication.  Continue weight loss.  Recheck in 4 months for office visit lipid panel hepatic function panel basal metabolic panel TSH and free T4  Signed, Darlin Coco MD 10/05/2014 5:17 PM    [facility], [place], [place]  [address] Phone: [phone]; Fax: [phone]

## 2014-10-19 ENCOUNTER — Other Ambulatory Visit: Payer: Self-pay | Admitting: *Deleted

## 2014-10-19 DIAGNOSIS — E7800 Pure hypercholesterolemia, unspecified: Secondary | ICD-10-CM

## 2014-10-19 DIAGNOSIS — E039 Hypothyroidism, unspecified: Secondary | ICD-10-CM

## 2014-11-02 DIAGNOSIS — M5136 Other intervertebral disc degeneration, lumbar region: Secondary | ICD-10-CM | POA: Diagnosis not present

## 2014-11-02 DIAGNOSIS — M9902 Segmental and somatic dysfunction of thoracic region: Secondary | ICD-10-CM | POA: Diagnosis not present

## 2014-11-02 DIAGNOSIS — M5134 Other intervertebral disc degeneration, thoracic region: Secondary | ICD-10-CM | POA: Diagnosis not present

## 2014-11-02 DIAGNOSIS — M9903 Segmental and somatic dysfunction of lumbar region: Secondary | ICD-10-CM | POA: Diagnosis not present

## 2014-11-02 DIAGNOSIS — M9905 Segmental and somatic dysfunction of pelvic region: Secondary | ICD-10-CM | POA: Diagnosis not present

## 2014-11-02 DIAGNOSIS — M5431 Sciatica, right side: Secondary | ICD-10-CM | POA: Diagnosis not present

## 2014-11-03 DIAGNOSIS — M5441 Lumbago with sciatica, right side: Secondary | ICD-10-CM | POA: Diagnosis not present

## 2014-11-03 DIAGNOSIS — M25551 Pain in right hip: Secondary | ICD-10-CM | POA: Diagnosis not present

## 2014-11-03 DIAGNOSIS — M1712 Unilateral primary osteoarthritis, left knee: Secondary | ICD-10-CM | POA: Diagnosis not present

## 2014-11-03 DIAGNOSIS — M1711 Unilateral primary osteoarthritis, right knee: Secondary | ICD-10-CM | POA: Diagnosis not present

## 2014-11-07 DIAGNOSIS — M9905 Segmental and somatic dysfunction of pelvic region: Secondary | ICD-10-CM | POA: Diagnosis not present

## 2014-11-07 DIAGNOSIS — M5134 Other intervertebral disc degeneration, thoracic region: Secondary | ICD-10-CM | POA: Diagnosis not present

## 2014-11-07 DIAGNOSIS — M9902 Segmental and somatic dysfunction of thoracic region: Secondary | ICD-10-CM | POA: Diagnosis not present

## 2014-11-07 DIAGNOSIS — M5431 Sciatica, right side: Secondary | ICD-10-CM | POA: Diagnosis not present

## 2014-11-07 DIAGNOSIS — M5136 Other intervertebral disc degeneration, lumbar region: Secondary | ICD-10-CM | POA: Diagnosis not present

## 2014-11-07 DIAGNOSIS — M9903 Segmental and somatic dysfunction of lumbar region: Secondary | ICD-10-CM | POA: Diagnosis not present

## 2014-11-09 DIAGNOSIS — M9902 Segmental and somatic dysfunction of thoracic region: Secondary | ICD-10-CM | POA: Diagnosis not present

## 2014-11-09 DIAGNOSIS — M9905 Segmental and somatic dysfunction of pelvic region: Secondary | ICD-10-CM | POA: Diagnosis not present

## 2014-11-09 DIAGNOSIS — M5431 Sciatica, right side: Secondary | ICD-10-CM | POA: Diagnosis not present

## 2014-11-09 DIAGNOSIS — M9903 Segmental and somatic dysfunction of lumbar region: Secondary | ICD-10-CM | POA: Diagnosis not present

## 2014-11-09 DIAGNOSIS — M5136 Other intervertebral disc degeneration, lumbar region: Secondary | ICD-10-CM | POA: Diagnosis not present

## 2014-11-09 DIAGNOSIS — M5134 Other intervertebral disc degeneration, thoracic region: Secondary | ICD-10-CM | POA: Diagnosis not present

## 2014-12-03 DIAGNOSIS — M1711 Unilateral primary osteoarthritis, right knee: Secondary | ICD-10-CM | POA: Diagnosis not present

## 2014-12-03 DIAGNOSIS — M1712 Unilateral primary osteoarthritis, left knee: Secondary | ICD-10-CM | POA: Diagnosis not present

## 2014-12-11 ENCOUNTER — Other Ambulatory Visit: Payer: Self-pay | Admitting: Cardiology

## 2015-01-02 ENCOUNTER — Encounter: Payer: Self-pay | Admitting: Adult Health

## 2015-01-02 ENCOUNTER — Ambulatory Visit (INDEPENDENT_AMBULATORY_CARE_PROVIDER_SITE_OTHER): Payer: Medicare Other | Admitting: Adult Health

## 2015-01-02 VITALS — BP 130/70 | HR 67 | Ht 72.0 in | Wt 214.0 lb

## 2015-01-02 DIAGNOSIS — I10 Essential (primary) hypertension: Secondary | ICD-10-CM

## 2015-01-02 DIAGNOSIS — Z7189 Other specified counseling: Secondary | ICD-10-CM

## 2015-01-02 DIAGNOSIS — Z7689 Persons encountering health services in other specified circumstances: Secondary | ICD-10-CM

## 2015-01-02 DIAGNOSIS — Z23 Encounter for immunization: Secondary | ICD-10-CM | POA: Diagnosis not present

## 2015-01-02 NOTE — Progress Notes (Signed)
HPI:  Carlos Juarez is here to establish care. He is a very pleasant 74 year old caucasian male who  has a past medical history of HTN (hypertension); Hypercholesteremia; Hypothyroidism; and PVC's (premature ventricular contractions).  Last PCP and physical: Is seen by Cardiology every four months and they do his labs. He is also followed at the [place]. He would like to use this office for acute visits.  Immunizations:UTD Diet:Eats healthy, likes to eat fruits and vegetables.  Exercise: Works out with trainer twice a week. Rowing machine.  Colonoscopy:Unsure of when he last had one. Was going every five years.  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  [facility]   Has the following chronic problems that require follow up and concerns today:  HTN - Is followed by cardiology for this. He feels as though it is well controlled.   Hypothyroidism - Is currently taking Synthroid 134mcg for this. His last TSH was 0.45 in June of 2016. He denies any issues currently with his hypothyroidism.     ROS negative for unless reported above: fevers, chills,feeling poorly, unintentional weight loss, hearing or vision loss, chest pain, palpitations, leg claudication, struggling to breath,Not feeling congested in the chest, no orthopenia, no cough,no wheezing, normal appetite, no soft tissue swelling, no hemoptysis, melena, hematochezia, hematuria, falls, loc, si, or thoughts of self harm.    Past Medical History  Diagnosis Date  . HTN (hypertension)   . Hypercholesteremia   . Hypothyroidism   . PVC's (premature ventricular contractions)     Past Surgical History  Procedure Laterality Date  . Hernia repair      Family History  Problem Relation Age of Onset  . Hypertension Mother   . Heart disease Father   . Heart attack Father 3    Social History   Social History  . Marital Status: Married    Spouse Name: N/A  . Number of  Children: N/A  . Years of Education: N/A   Social History Main Topics  . Smoking status: Former Research scientist (life sciences)  . Smokeless tobacco: Not on file  . Alcohol Use: Not on file  . Drug Use: Not on file  . Sexual Activity: Not on file   Other Topics Concern  . Not on file   Social History Narrative     Current outpatient prescriptions:  .  ALPRAZolam (XANAX) 0.5 MG tablet, Take 0.5 mg by mouth at bedtime as needed for anxiety (sleep)., Disp: , Rfl:  .  aspirin 81 MG tablet, Take 81 mg by mouth daily.  , Disp: , Rfl:  .  atenolol (TENORMIN) 50 MG tablet, Take 50 mg by mouth daily.  , Disp: , Rfl:  .  Coenzyme Q10 (COQ10 PO), Take 2 mLs by mouth daily. , Disp: , Rfl:  .  fish oil-omega-3 fatty acids 1000 MG capsule, Take 2 g by mouth daily. Taking liquid 1 tsp daily, Disp: , Rfl:  .  levothyroxine (SYNTHROID, LEVOTHROID) 150 MCG tablet, Take 150 mcg by mouth daily.  , Disp: , Rfl:  .  VYTORIN 10-20 MG per tablet, TAKE 1 TABLET AT BEDTIME, Disp: 90 tablet, Rfl: 1  EXAM:  Filed Vitals:   01/02/15 1348  BP: 130/70  Pulse: 67    Body mass index is 29.02 kg/(m^2).  GENERAL: vitals reviewed and listed above, alert, oriented, appears well hydrated and in no acute distress.   HEENT: atraumatic, conjunttiva clear, no obvious  abnormalities on inspection of external nose and ears. TM visualized, no cerumen impaction.Wears prescription glasses   NECK: Neck is soft and supple without masses, no adenopathy or thyromegaly, trachea midline, no JVD. Normal range of motion.   LUNGS: clear to auscultation bilaterally, no wheezes, rales or rhonchi, good air movement  CV: Regular rate and rhythm, normal S1/S2, no audible murmurs, gallops, or rubs. No carotid bruit and no peripheral edema.   MS: moves all extremities without noticeable abnormality. No edema noted  Abd: soft/nontender/nondistended/normal bowel sounds   Skin: warm and dry, no rash   Extremities: No clubbing, cyanosis, or edema.  Capillary refill is WNL. Pulses intact bilaterally in upper and lower extremities.   Neuro: CN II-XII intact, sensation and reflexes normal throughout, 5/5 muscle strength in bilateral upper and lower extremities. Normal finger to nose. Normal rapid alternating movements.  PSYCH: pleasant and cooperative, no obvious depression or anxiety  ASSESSMENT AND PLAN:  1. Encounter to establish care - Follow up as needed  - Continue to eat healthy and exercise.  - Reviewed most recent labs with patient.   2. Need for prophylactic vaccination and inoculation against influenza - Flu Vaccine QUAD 36+ mos IM; Standing - Flu Vaccine QUAD 36+ mos IM  3. Essential Hypertension - Controlled with current medication regimen.    -We reviewed the PMH, PSH, FH, SH, Meds and Allergies. -We provided refills for any medications we will prescribe as needed. -We addressed current concerns per orders and patient instructions. -We have asked for records for pertinent exams, studies, vaccines and notes from previous providers. -We have advised patient to follow up per instructions below.   -Patient advised to return or notify a provider immediately if symptoms worsen or persist or new concerns arise.   Dorothyann Peng, AGNP

## 2015-01-02 NOTE — Patient Instructions (Signed)
It was great meeting you today! I am so happy to have you on the team.  Continue to eat healthy and exercise Follow up with me as needed.

## 2015-01-02 NOTE — Progress Notes (Signed)
Pre visit review using our clinic review tool, if applicable. No additional management support is needed unless otherwise documented below in the visit note. 

## 2015-01-26 DIAGNOSIS — M25461 Effusion, right knee: Secondary | ICD-10-CM | POA: Diagnosis not present

## 2015-01-26 DIAGNOSIS — M25561 Pain in right knee: Secondary | ICD-10-CM | POA: Diagnosis not present

## 2015-02-01 DIAGNOSIS — M9902 Segmental and somatic dysfunction of thoracic region: Secondary | ICD-10-CM | POA: Diagnosis not present

## 2015-02-01 DIAGNOSIS — M5136 Other intervertebral disc degeneration, lumbar region: Secondary | ICD-10-CM | POA: Diagnosis not present

## 2015-02-01 DIAGNOSIS — M5431 Sciatica, right side: Secondary | ICD-10-CM | POA: Diagnosis not present

## 2015-02-01 DIAGNOSIS — M9905 Segmental and somatic dysfunction of pelvic region: Secondary | ICD-10-CM | POA: Diagnosis not present

## 2015-02-01 DIAGNOSIS — M9903 Segmental and somatic dysfunction of lumbar region: Secondary | ICD-10-CM | POA: Diagnosis not present

## 2015-02-01 DIAGNOSIS — M5134 Other intervertebral disc degeneration, thoracic region: Secondary | ICD-10-CM | POA: Diagnosis not present

## 2015-02-17 ENCOUNTER — Other Ambulatory Visit (INDEPENDENT_AMBULATORY_CARE_PROVIDER_SITE_OTHER): Payer: Medicare Other | Admitting: *Deleted

## 2015-02-17 DIAGNOSIS — I10 Essential (primary) hypertension: Secondary | ICD-10-CM

## 2015-02-17 DIAGNOSIS — E7800 Pure hypercholesterolemia, unspecified: Secondary | ICD-10-CM

## 2015-02-17 DIAGNOSIS — E039 Hypothyroidism, unspecified: Secondary | ICD-10-CM

## 2015-02-17 LAB — BASIC METABOLIC PANEL
BUN: 17 mg/dL (ref 7–25)
CALCIUM: 9.1 mg/dL (ref 8.6–10.3)
CO2: 27 mmol/L (ref 20–31)
Chloride: 102 mmol/L (ref 98–110)
Creat: 1.05 mg/dL (ref 0.70–1.18)
GLUCOSE: 113 mg/dL — AB (ref 65–99)
Potassium: 3.9 mmol/L (ref 3.5–5.3)
SODIUM: 139 mmol/L (ref 135–146)

## 2015-02-17 LAB — LIPID PANEL
CHOL/HDL RATIO: 2 ratio (ref <=5.0)
CHOLESTEROL: 182 mg/dL (ref 125–200)
HDL: 91 mg/dL (ref >=40)
LDL Cholesterol: 72 mg/dL (ref <130)
TRIGLYCERIDES: 94 mg/dL (ref <150)
VLDL: 19 mg/dL (ref <30)

## 2015-02-17 LAB — HEPATIC FUNCTION PANEL
ALT: 26 U/L (ref 9–46)
AST: 26 U/L (ref 10–35)
Albumin: 3.5 g/dL — ABNORMAL LOW (ref 3.6–5.1)
Alkaline Phosphatase: 51 U/L (ref 40–115)
BILIRUBIN DIRECT: 0.3 mg/dL — AB (ref <=0.2)
Indirect Bilirubin: 1 mg/dL (ref 0.2–1.2)
TOTAL PROTEIN: 6.2 g/dL (ref 6.1–8.1)
Total Bilirubin: 1.3 mg/dL — ABNORMAL HIGH (ref 0.2–1.2)

## 2015-02-17 LAB — T4, FREE: FREE T4: 1.59 ng/dL (ref 0.80–1.80)

## 2015-02-17 LAB — TSH: TSH: 0.947 u[IU]/mL (ref 0.350–4.500)

## 2015-02-19 NOTE — Progress Notes (Signed)
Quick Note:  Please make copy of labs for patient visit. ______ 

## 2015-02-22 ENCOUNTER — Ambulatory Visit (INDEPENDENT_AMBULATORY_CARE_PROVIDER_SITE_OTHER): Payer: Medicare Other | Admitting: Cardiology

## 2015-02-22 ENCOUNTER — Encounter: Payer: Self-pay | Admitting: Cardiology

## 2015-02-22 VITALS — BP 126/82 | HR 57 | Ht 72.0 in | Wt 215.4 lb

## 2015-02-22 DIAGNOSIS — I493 Ventricular premature depolarization: Secondary | ICD-10-CM

## 2015-02-22 DIAGNOSIS — E7800 Pure hypercholesterolemia, unspecified: Secondary | ICD-10-CM | POA: Diagnosis not present

## 2015-02-22 DIAGNOSIS — I10 Essential (primary) hypertension: Secondary | ICD-10-CM | POA: Diagnosis not present

## 2015-02-22 DIAGNOSIS — E039 Hypothyroidism, unspecified: Secondary | ICD-10-CM

## 2015-02-22 NOTE — Patient Instructions (Signed)
Medication Instructions:  Your physician recommends that you continue on your current medications as directed. Please refer to the Current Medication list given to you today.  Labwork: NONE  Testing/Procedures: NONE  Follow-Up: Your physician recommends that you schedule a follow-up appointment in: 4 months with fasting labs (lp/bmet/hfp/FT4/TSH) WITH Tera Helper NP OR SCOTT W PA   If you need a refill on your cardiac medications before your next appointment, please call your pharmacy.

## 2015-02-22 NOTE — Progress Notes (Signed)
Cardiology Office Note   Date:  02/22/2015   ID:  Carlos Juarez, Carlos Juarez 26-Jan-1941, MRN [id number]  PCP:  Dorothyann Peng, NP  Cardiologist: Darlin Coco MD  Chief Complaint  Patient presents with  . Hypertension      History of Present Illness: Carlos Juarez is a 74 y.o. male who presents for a scheduled visit  Carlos Juarez is a 74 y.o. male who presents for a four-month follow-up office visit. Marland Kitchen He has a history of essential hypertension and a history of hypercholesterolemia. He does not have any history of ischemic heart disease. He had a normal treadmill Cardiolite stress test on 07/24/05 which showed no ischemia and his ejection fraction was 57% with no wall motion abnormalities. He also has a history of hypothyroid disease. Since last visit he has not had any new cardiovascular symptoms.  He has had some orthopedic problems. He has had previous arthroscopic surgery on the left knee. He had a good result from that. He is having more pain in the right knee. Recently his orthopedist Dr. Dorna Leitz gave him steroids injections into both knees. This helped considerably. He has also been having some right hip pain and is seeing a physiotherapist. He still works out with a trainer twice a week. His weight is up 1 pound since last visit. Last month he had a fall down his steps at home.  He did not break anything but his knee hurt worse and he got another injection from Dr. Berenice Primas into the right knee and also took Mobic for about a week.  However with the Mobic he gained weight.  Past Medical History  Diagnosis Date  . HTN (hypertension)   . Hypercholesteremia   . Hypothyroidism   . PVC's (premature ventricular contractions)     Past Surgical History  Procedure Laterality Date  . Hernia repair    . Tonsillectomy    . Meniscus repair Left      Current Outpatient Prescriptions  Medication Sig Dispense Refill  . ALPRAZolam (XANAX) 0.5 MG tablet Take 0.5 mg  by mouth at bedtime as needed for anxiety (sleep).    Marland Kitchen aspirin 81 MG tablet Take 81 mg by mouth daily.      Marland Kitchen atenolol (TENORMIN) 50 MG tablet Take 50 mg by mouth daily.      . Coenzyme Q10 (COQ10 PO) Take 2 mLs by mouth daily.     Marland Kitchen ezetimibe-simvastatin (VYTORIN) 10-20 MG tablet Take 1 tablet by mouth at bedtime.    . fish oil-omega-3 fatty acids 1000 MG capsule Take 2 g by mouth daily. Taking liquid 1 tsp daily    . levothyroxine (SYNTHROID, LEVOTHROID) 150 MCG tablet Take 150 mcg by mouth daily.      . meloxicam (MOBIC) 15 MG tablet Take 15 mg by mouth daily as needed. Knee pain     No current facility-administered medications for this visit.    Allergies:   Epinephrine    Social History:  The patient  reports that he has quit smoking. He does not have any smokeless tobacco history on file. He reports that he drinks alcohol.   Family History:  The patient's family history includes Heart attack (age of onset: 59) in his father; Heart disease in his father; Hypertension in his mother.    ROS:  Please see the history of present illness.   Otherwise, review of systems are positive for none.   All other systems are reviewed and negative.  PHYSICAL EXAM: VS:  BP 126/82 mmHg  Pulse 57  Ht 6' (1.829 m)  Wt 215 lb 6.4 oz (97.705 kg)  BMI 29.21 kg/m2 , BMI Body mass index is 29.21 kg/(m^2). GEN: Well nourished, well developed, in no acute distress HEENT: normal Neck: no JVD, carotid bruits, or masses Cardiac: RRR; no murmurs, rubs, or gallops,no edema  Respiratory:  clear to auscultation bilaterally, normal work of breathing GI: soft, nontender, nondistended, + BS MS: no deformity or atrophy Skin: warm and dry, no rash Neuro:  Strength and sensation are intact Psych: euthymic mood, full affect   EKG:  EKG is ordered today. The ekg ordered today demonstrates sinus bradycardia.  Otherwise normal EKG   Recent Labs: 02/17/2015: ALT 26; BUN 17; Creat 1.05; Potassium 3.9; Sodium  139; TSH 0.947    Lipid Panel    Component Value Date/Time   CHOL 182 02/17/2015 0807   TRIG 94 02/17/2015 0807   HDL 91 02/17/2015 0807   CHOLHDL 2.0 02/17/2015 0807   VLDL 19 02/17/2015 0807   LDLCALC 72 02/17/2015 0807   LDLDIRECT 103.7 01/03/2012 0832      Wt Readings from Last 3 Encounters:  02/22/15 215 lb 6.4 oz (97.705 kg)  01/02/15 214 lb (97.07 kg)  10/05/14 205 lb 6.4 oz (93.169 kg)         ASSESSMENT AND PLAN:  1. Benign hypertensive heart disease without heart failure. 2. Hypercholesterolemia 3. 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 4. Hypothyroidism   Current medicines are reviewed at length with the patient today.  The patient does not have concerns regarding medicines.  The following changes have been made:  no change  Labs/ tests ordered today include:   Orders Placed This Encounter  Procedures  . EKG 12-Lead    Disposition: Recheck in 4 months for follow-up visit and fasting lipid panel hepatic function panel basal metabolic panel and thyroid function studies  Signed, Darlin Coco MD 02/22/2015 10:20 AM    [facility], [place], [place]  [address] Phone: [phone]; Fax: [phone]

## 2015-03-22 DIAGNOSIS — H25813 Combined forms of age-related cataract, bilateral: Secondary | ICD-10-CM | POA: Diagnosis not present

## 2015-03-22 DIAGNOSIS — H43813 Vitreous degeneration, bilateral: Secondary | ICD-10-CM | POA: Diagnosis not present

## 2015-03-22 DIAGNOSIS — H01001 Unspecified blepharitis right upper eyelid: Secondary | ICD-10-CM | POA: Diagnosis not present

## 2015-03-22 DIAGNOSIS — H524 Presbyopia: Secondary | ICD-10-CM | POA: Diagnosis not present

## 2015-03-23 DIAGNOSIS — M170 Bilateral primary osteoarthritis of knee: Secondary | ICD-10-CM | POA: Diagnosis not present

## 2015-03-23 DIAGNOSIS — M25562 Pain in left knee: Secondary | ICD-10-CM | POA: Diagnosis not present

## 2015-03-27 DIAGNOSIS — M5136 Other intervertebral disc degeneration, lumbar region: Secondary | ICD-10-CM | POA: Diagnosis not present

## 2015-03-27 DIAGNOSIS — M5431 Sciatica, right side: Secondary | ICD-10-CM | POA: Diagnosis not present

## 2015-03-27 DIAGNOSIS — M9905 Segmental and somatic dysfunction of pelvic region: Secondary | ICD-10-CM | POA: Diagnosis not present

## 2015-03-27 DIAGNOSIS — M5134 Other intervertebral disc degeneration, thoracic region: Secondary | ICD-10-CM | POA: Diagnosis not present

## 2015-03-27 DIAGNOSIS — M9903 Segmental and somatic dysfunction of lumbar region: Secondary | ICD-10-CM | POA: Diagnosis not present

## 2015-03-27 DIAGNOSIS — M9902 Segmental and somatic dysfunction of thoracic region: Secondary | ICD-10-CM | POA: Diagnosis not present

## 2015-03-29 DIAGNOSIS — M9903 Segmental and somatic dysfunction of lumbar region: Secondary | ICD-10-CM | POA: Diagnosis not present

## 2015-03-29 DIAGNOSIS — M5431 Sciatica, right side: Secondary | ICD-10-CM | POA: Diagnosis not present

## 2015-03-29 DIAGNOSIS — M5136 Other intervertebral disc degeneration, lumbar region: Secondary | ICD-10-CM | POA: Diagnosis not present

## 2015-03-29 DIAGNOSIS — M5134 Other intervertebral disc degeneration, thoracic region: Secondary | ICD-10-CM | POA: Diagnosis not present

## 2015-03-29 DIAGNOSIS — M9902 Segmental and somatic dysfunction of thoracic region: Secondary | ICD-10-CM | POA: Diagnosis not present

## 2015-03-29 DIAGNOSIS — M9905 Segmental and somatic dysfunction of pelvic region: Secondary | ICD-10-CM | POA: Diagnosis not present

## 2015-04-03 DIAGNOSIS — M9905 Segmental and somatic dysfunction of pelvic region: Secondary | ICD-10-CM | POA: Diagnosis not present

## 2015-04-03 DIAGNOSIS — M5136 Other intervertebral disc degeneration, lumbar region: Secondary | ICD-10-CM | POA: Diagnosis not present

## 2015-04-03 DIAGNOSIS — M5134 Other intervertebral disc degeneration, thoracic region: Secondary | ICD-10-CM | POA: Diagnosis not present

## 2015-04-03 DIAGNOSIS — M9902 Segmental and somatic dysfunction of thoracic region: Secondary | ICD-10-CM | POA: Diagnosis not present

## 2015-04-03 DIAGNOSIS — M5431 Sciatica, right side: Secondary | ICD-10-CM | POA: Diagnosis not present

## 2015-04-03 DIAGNOSIS — M9903 Segmental and somatic dysfunction of lumbar region: Secondary | ICD-10-CM | POA: Diagnosis not present

## 2015-04-05 DIAGNOSIS — M5134 Other intervertebral disc degeneration, thoracic region: Secondary | ICD-10-CM | POA: Diagnosis not present

## 2015-04-05 DIAGNOSIS — M9905 Segmental and somatic dysfunction of pelvic region: Secondary | ICD-10-CM | POA: Diagnosis not present

## 2015-04-05 DIAGNOSIS — M5136 Other intervertebral disc degeneration, lumbar region: Secondary | ICD-10-CM | POA: Diagnosis not present

## 2015-04-05 DIAGNOSIS — M5431 Sciatica, right side: Secondary | ICD-10-CM | POA: Diagnosis not present

## 2015-04-05 DIAGNOSIS — M9902 Segmental and somatic dysfunction of thoracic region: Secondary | ICD-10-CM | POA: Diagnosis not present

## 2015-04-05 DIAGNOSIS — M9903 Segmental and somatic dysfunction of lumbar region: Secondary | ICD-10-CM | POA: Diagnosis not present

## 2015-04-19 DIAGNOSIS — M9905 Segmental and somatic dysfunction of pelvic region: Secondary | ICD-10-CM | POA: Diagnosis not present

## 2015-04-19 DIAGNOSIS — M5136 Other intervertebral disc degeneration, lumbar region: Secondary | ICD-10-CM | POA: Diagnosis not present

## 2015-04-19 DIAGNOSIS — M9902 Segmental and somatic dysfunction of thoracic region: Secondary | ICD-10-CM | POA: Diagnosis not present

## 2015-04-19 DIAGNOSIS — M9903 Segmental and somatic dysfunction of lumbar region: Secondary | ICD-10-CM | POA: Diagnosis not present

## 2015-04-19 DIAGNOSIS — M5431 Sciatica, right side: Secondary | ICD-10-CM | POA: Diagnosis not present

## 2015-04-19 DIAGNOSIS — M5134 Other intervertebral disc degeneration, thoracic region: Secondary | ICD-10-CM | POA: Diagnosis not present

## 2015-04-25 DIAGNOSIS — M5136 Other intervertebral disc degeneration, lumbar region: Secondary | ICD-10-CM | POA: Diagnosis not present

## 2015-04-25 DIAGNOSIS — M9905 Segmental and somatic dysfunction of pelvic region: Secondary | ICD-10-CM | POA: Diagnosis not present

## 2015-04-25 DIAGNOSIS — M9903 Segmental and somatic dysfunction of lumbar region: Secondary | ICD-10-CM | POA: Diagnosis not present

## 2015-04-25 DIAGNOSIS — M5431 Sciatica, right side: Secondary | ICD-10-CM | POA: Diagnosis not present

## 2015-04-25 DIAGNOSIS — M5134 Other intervertebral disc degeneration, thoracic region: Secondary | ICD-10-CM | POA: Diagnosis not present

## 2015-04-25 DIAGNOSIS — M9902 Segmental and somatic dysfunction of thoracic region: Secondary | ICD-10-CM | POA: Diagnosis not present

## 2015-05-02 DIAGNOSIS — M542 Cervicalgia: Secondary | ICD-10-CM | POA: Diagnosis not present

## 2015-05-02 DIAGNOSIS — M25531 Pain in right wrist: Secondary | ICD-10-CM | POA: Diagnosis not present

## 2015-05-02 DIAGNOSIS — M25562 Pain in left knee: Secondary | ICD-10-CM | POA: Diagnosis not present

## 2015-05-02 DIAGNOSIS — M25561 Pain in right knee: Secondary | ICD-10-CM | POA: Diagnosis not present

## 2015-05-23 DIAGNOSIS — M9903 Segmental and somatic dysfunction of lumbar region: Secondary | ICD-10-CM | POA: Diagnosis not present

## 2015-05-23 DIAGNOSIS — M5134 Other intervertebral disc degeneration, thoracic region: Secondary | ICD-10-CM | POA: Diagnosis not present

## 2015-05-23 DIAGNOSIS — M9905 Segmental and somatic dysfunction of pelvic region: Secondary | ICD-10-CM | POA: Diagnosis not present

## 2015-05-23 DIAGNOSIS — M9902 Segmental and somatic dysfunction of thoracic region: Secondary | ICD-10-CM | POA: Diagnosis not present

## 2015-05-23 DIAGNOSIS — M5136 Other intervertebral disc degeneration, lumbar region: Secondary | ICD-10-CM | POA: Diagnosis not present

## 2015-05-23 DIAGNOSIS — M5431 Sciatica, right side: Secondary | ICD-10-CM | POA: Diagnosis not present

## 2015-05-30 ENCOUNTER — Telehealth: Payer: Self-pay | Admitting: *Deleted

## 2015-05-30 ENCOUNTER — Other Ambulatory Visit: Payer: Self-pay | Admitting: *Deleted

## 2015-05-30 MED ORDER — EZETIMIBE-SIMVASTATIN 10-20 MG PO TABS: 1.0000 | ORAL_TABLET | Freq: Every day | ORAL | Status: DC

## 2015-05-30 NOTE — Telephone Encounter (Signed)
Patient called and stated that he would like to come in for his lab work on Friday 07/07/15 instead of the same day as his appointment.

## 2015-05-31 NOTE — Telephone Encounter (Signed)
Left message to call back  

## 2015-06-12 NOTE — Telephone Encounter (Signed)
Follow up      Calling to see if he can come in for labs on 07-07-15 and have his appt on 07-11-15

## 2015-06-12 NOTE — Telephone Encounter (Signed)
Left message to call back  

## 2015-06-30 ENCOUNTER — Other Ambulatory Visit (INDEPENDENT_AMBULATORY_CARE_PROVIDER_SITE_OTHER): Payer: Medicare Other | Admitting: *Deleted

## 2015-06-30 DIAGNOSIS — I10 Essential (primary) hypertension: Secondary | ICD-10-CM

## 2015-06-30 LAB — BASIC METABOLIC PANEL
BUN: 22 mg/dL (ref 7–25)
CO2: 29 mmol/L (ref 20–31)
Calcium: 9 mg/dL (ref 8.6–10.3)
Chloride: 100 mmol/L (ref 98–110)
Creat: 1.14 mg/dL (ref 0.70–1.18)
Glucose, Bld: 129 mg/dL — ABNORMAL HIGH (ref 65–99)
Potassium: 4.2 mmol/L (ref 3.5–5.3)
Sodium: 139 mmol/L (ref 135–146)

## 2015-06-30 LAB — HEPATIC FUNCTION PANEL
ALT: 26 U/L (ref 9–46)
AST: 22 U/L (ref 10–35)
Albumin: 3.6 g/dL (ref 3.6–5.1)
Alkaline Phosphatase: 56 U/L (ref 40–115)
Bilirubin, Direct: 0.2 mg/dL (ref <=0.2)
Indirect Bilirubin: 1 mg/dL (ref 0.2–1.2)
Total Bilirubin: 1.2 mg/dL (ref 0.2–1.2)
Total Protein: 5.7 g/dL — ABNORMAL LOW (ref 6.1–8.1)

## 2015-06-30 LAB — LIPID PANEL
Cholesterol: 181 mg/dL (ref 125–200)
HDL: 77 mg/dL (ref >=40)
LDL Cholesterol: 80 mg/dL (ref <130)
Total CHOL/HDL Ratio: 2.4 Ratio (ref <=5.0)
Triglycerides: 119 mg/dL (ref <150)
VLDL: 24 mg/dL (ref <30)

## 2015-06-30 LAB — T4, FREE: Free T4: 1.7 ng/dL (ref 0.8–1.8)

## 2015-06-30 LAB — TSH: TSH: 0.58 mIU/L (ref 0.40–4.50)

## 2015-07-04 NOTE — Telephone Encounter (Signed)
Patient already had labs done. 

## 2015-07-11 ENCOUNTER — Ambulatory Visit (INDEPENDENT_AMBULATORY_CARE_PROVIDER_SITE_OTHER): Payer: Medicare Other | Admitting: Nurse Practitioner

## 2015-07-11 ENCOUNTER — Encounter: Payer: Self-pay | Admitting: Nurse Practitioner

## 2015-07-11 ENCOUNTER — Other Ambulatory Visit: Payer: Medicare Other

## 2015-07-11 VITALS — BP 150/92 | HR 60 | Ht 72.0 in | Wt 215.4 lb

## 2015-07-11 DIAGNOSIS — E7800 Pure hypercholesterolemia, unspecified: Secondary | ICD-10-CM

## 2015-07-11 DIAGNOSIS — I493 Ventricular premature depolarization: Secondary | ICD-10-CM

## 2015-07-11 DIAGNOSIS — I119 Hypertensive heart disease without heart failure: Secondary | ICD-10-CM | POA: Diagnosis not present

## 2015-07-11 NOTE — Progress Notes (Signed)
CARDIOLOGY OFFICE NOTE  Date:  07/11/2015    [name] Date of Birth: [birth year] Medical Record [id number]  PCP:  Dorothyann Peng, NP  Cardiologist:  Former patient of Dr. Sherryl Barters    Chief Complaint  Patient presents with  . Hyperlipidemia  . Hypertension    4 month check - former patient of Dr. Bobbye Riggs    History of Present Illness: Carlos Juarez is a 75 y.o. male who presents today for a 4 month check. Former patient of Dr. Mare Ferrari.   He has a history of essential hypertension and a history of hypercholesterolemia. He does not have any history of ischemic heart disease. He had a normal treadmill Cardiolite stress test on 07/24/05 which showed no ischemia and his ejection fraction was 57% with no wall motion abnormalities. He also has a history of hypothyroid disease.   Last seen back in November - cardiac status was felt to be stable. Had had a fall.   Comes back today. Here alone. Remains on NSAID. BP high here today but says he has good control at home. Asking for his Xanax refill - would defer to PCP. He had his labs done earlier this month - those are reviewed. Blood sugar creeping up. He says he is walking most days but knows he needs to lose weight. No chest pain. Breathing is good.   Past Medical History  Diagnosis Date  . HTN (hypertension)   . Hypercholesteremia   . Hypothyroidism   . PVC's (premature ventricular contractions)     Past Surgical History  Procedure Laterality Date  . Hernia repair    . Tonsillectomy    . Meniscus repair Left      Medications: Current Outpatient Prescriptions  Medication Sig Dispense Refill  . ALPRAZolam (XANAX) 0.5 MG tablet Take 0.5 mg by mouth at bedtime as needed for anxiety (sleep).    Marland Kitchen aspirin 81 MG tablet Take 81 mg by mouth daily.      Marland Kitchen atenolol (TENORMIN) 50 MG tablet Take 50 mg by mouth daily.      . celecoxib (CELEBREX) 200 MG capsule Take 200 mg by mouth 2 (two) times daily.     .  Coenzyme Q10 (COQ10 PO) Take 2 mLs by mouth daily.     Marland Kitchen ezetimibe-simvastatin (VYTORIN) 10-20 MG tablet Take 1 tablet by mouth at bedtime. 90 tablet 2  . fish oil-omega-3 fatty acids 1000 MG capsule Take 2 g by mouth daily. Taking liquid 1 tsp daily    . levothyroxine (SYNTHROID, LEVOTHROID) 150 MCG tablet Take 150 mcg by mouth daily.       No current facility-administered medications for this visit.    Allergies: Allergies  Allergen Reactions  . Epinephrine Other (See Comments)    Light headness    Social History: The patient  reports that he has quit smoking. He does not have any smokeless tobacco history on file. He reports that he drinks alcohol.   Family History: The patient's family history includes Heart attack (age of onset: 64) in his father; Heart disease in his father; Hypertension in his mother.   Review of Systems: Please see the history of present illness.   Otherwise, the review of systems is positive for none.   All other systems are reviewed and negative.   Physical Exam: VS:  BP 150/92 mmHg  Pulse 60  Ht 6' (1.829 m)  Wt 215 lb 6.4 oz (97.705 kg)  BMI 29.21 kg/m2 .  BMI Body  mass index is 29.21 kg/(m^2).  Wt Readings from Last 3 Encounters:  07/11/15 215 lb 6.4 oz (97.705 kg)  02/22/15 215 lb 6.4 oz (97.705 kg)  01/02/15 214 lb (97.07 kg)   BP recheck by me is 140/80  General: Pleasant. Well developed, well nourished and in no acute distress.  HEENT: Normal. Neck: Supple, no JVD, carotid bruits, or masses noted.  Cardiac: Regular rate and rhythm. No murmurs, rubs, or gallops. No edema.  Respiratory:  Lungs are clear to auscultation bilaterally with normal work of breathing.  GI: Soft and nontender.  MS: No deformity or atrophy. Gait and ROM intact. Skin: Warm and dry. Color is normal.  Neuro:  Strength and sensation are intact and no gross focal deficits noted.  Psych: Alert, appropriate and with normal affect.   LABORATORY DATA:  EKG:  EKG is  not ordered today.  Lab Results  Component Value Date   GLUCOSE 129* 06/30/2015   CHOL 181 06/30/2015   TRIG 119 06/30/2015   HDL 77 06/30/2015   LDLDIRECT 103.7 01/03/2012   LDLCALC 80 06/30/2015   ALT 26 06/30/2015   AST 22 06/30/2015   NA 139 06/30/2015   K 4.2 06/30/2015   CL 100 06/30/2015   CREATININE 1.14 06/30/2015   BUN 22 06/30/2015   CO2 29 06/30/2015   TSH 0.58 06/30/2015    BNP (last 3 results) No results for input(s): BNP in the last 8760 hours.  ProBNP (last 3 results) No results for input(s): PROBNP in the last 8760 hours.   Other Studies Reviewed Today:   Assessment/Plan: 1. Benign hypertensive heart disease without heart failure - he has better BP control at home - I have asked him to continue to monitor.  No change in his current therapy for now.   2. Hypercholesterolemia - on statin - recent labs ok  3. Benign PVCs, not present on exam today. Quiescent.   4. Hypothyroidism - on replacement  5. Elevated glucose - offered to check A1C - he would like to discuss with PCP and work on weight first.   Current medicines are reviewed with the patient today.  The patient does not have concerns regarding medicines other than what has been noted above.  The following changes have been made:  See above.  Labs/ tests ordered today include:   No orders of the defined types were placed in this encounter.     Disposition:   FU with Dr. Acie Fredrickson in 6 months.    Patient is agreeable to this plan and will call if any problems develop in the interim.   Signed: Burtis Junes, RN, ANP-C 07/11/2015 9:47 AM  [facility] [address] [place], [place]  [address] Phone: [phone] Fax: [phone]

## 2015-07-11 NOTE — Patient Instructions (Addendum)
We will be checking the following labs today - NONE   Medication Instructions:    Continue with your current medicines.     Testing/Procedures To Be Arranged:  N/A  Follow-Up:   See Dr. Acie Fredrickson in 6 months    Other Special Instructions:   Work on diet/weight - this will help with the elevated blood sugar  Talk to your PCP about having a hemoglobin A1C - this checks your blood sugar for the past 3 months.     If you need a refill on your cardiac medications before your next appointment, please call your pharmacy.   Call the [place] office at [phone] if you have any questions, problems or concerns.

## 2015-08-07 DIAGNOSIS — D2262 Melanocytic nevi of left upper limb, including shoulder: Secondary | ICD-10-CM | POA: Diagnosis not present

## 2015-08-07 DIAGNOSIS — Z85828 Personal history of other malignant neoplasm of skin: Secondary | ICD-10-CM | POA: Diagnosis not present

## 2015-08-07 DIAGNOSIS — D2261 Melanocytic nevi of right upper limb, including shoulder: Secondary | ICD-10-CM | POA: Diagnosis not present

## 2015-08-07 DIAGNOSIS — D225 Melanocytic nevi of trunk: Secondary | ICD-10-CM | POA: Diagnosis not present

## 2015-08-07 DIAGNOSIS — D1801 Hemangioma of skin and subcutaneous tissue: Secondary | ICD-10-CM | POA: Diagnosis not present

## 2015-08-07 DIAGNOSIS — D2371 Other benign neoplasm of skin of right lower limb, including hip: Secondary | ICD-10-CM | POA: Diagnosis not present

## 2015-08-07 DIAGNOSIS — B353 Tinea pedis: Secondary | ICD-10-CM | POA: Diagnosis not present

## 2015-08-07 DIAGNOSIS — L821 Other seborrheic keratosis: Secondary | ICD-10-CM | POA: Diagnosis not present

## 2015-08-08 DIAGNOSIS — M25561 Pain in right knee: Secondary | ICD-10-CM | POA: Diagnosis not present

## 2015-08-08 DIAGNOSIS — M1712 Unilateral primary osteoarthritis, left knee: Secondary | ICD-10-CM | POA: Diagnosis not present

## 2015-08-08 DIAGNOSIS — M1711 Unilateral primary osteoarthritis, right knee: Secondary | ICD-10-CM | POA: Diagnosis not present

## 2015-08-08 DIAGNOSIS — M25562 Pain in left knee: Secondary | ICD-10-CM | POA: Diagnosis not present

## 2016-01-04 ENCOUNTER — Ambulatory Visit (INDEPENDENT_AMBULATORY_CARE_PROVIDER_SITE_OTHER): Payer: Medicare Other | Admitting: Cardiovascular Disease

## 2016-01-04 ENCOUNTER — Encounter: Payer: Self-pay | Admitting: Cardiovascular Disease

## 2016-01-04 ENCOUNTER — Encounter (INDEPENDENT_AMBULATORY_CARE_PROVIDER_SITE_OTHER): Payer: Self-pay

## 2016-01-04 VITALS — BP 140/90 | HR 71 | Ht 72.0 in | Wt 199.8 lb

## 2016-01-04 DIAGNOSIS — I10 Essential (primary) hypertension: Secondary | ICD-10-CM | POA: Diagnosis not present

## 2016-01-04 DIAGNOSIS — E785 Hyperlipidemia, unspecified: Secondary | ICD-10-CM | POA: Diagnosis not present

## 2016-01-04 LAB — COMPREHENSIVE METABOLIC PANEL
ALBUMIN: 3.9 g/dL (ref 3.6–5.1)
ALT: 22 U/L (ref 9–46)
AST: 22 U/L (ref 10–35)
Alkaline Phosphatase: 59 U/L (ref 40–115)
BUN: 21 mg/dL (ref 7–25)
CALCIUM: 9.1 mg/dL (ref 8.6–10.3)
CHLORIDE: 101 mmol/L (ref 98–110)
CO2: 29 mmol/L (ref 20–31)
Creat: 1.15 mg/dL (ref 0.70–1.18)
GLUCOSE: 90 mg/dL (ref 65–99)
POTASSIUM: 4.2 mmol/L (ref 3.5–5.3)
Sodium: 140 mmol/L (ref 135–146)
Total Bilirubin: 1 mg/dL (ref 0.2–1.2)
Total Protein: 6.1 g/dL (ref 6.1–8.1)

## 2016-01-04 LAB — LIPID PANEL
CHOL/HDL RATIO: 2.1 ratio (ref <=5.0)
CHOLESTEROL: 169 mg/dL (ref 125–200)
HDL: 80 mg/dL (ref >=40)
LDL Cholesterol: 70 mg/dL (ref <130)
Triglycerides: 97 mg/dL (ref <150)
VLDL: 19 mg/dL (ref <30)

## 2016-01-04 NOTE — Patient Instructions (Signed)
Medication Instructions:  Your physician recommends that you continue on your current medications as directed. Please refer to the Current Medication list given to you today.   Labwork: TODAY - cholesterol, complete metabolic panel  Your physician recommends that you return for lab work in: 6 months on the day of or a few days before your office visit with Dr. Nahser.  You will need to FAST for this appointment - nothing to eat or drink after midnight the night before except water.   Testing/Procedures: None Ordered   Follow-Up: Your physician wants you to follow-up in: 6 months with Dr. Nahser.  You will receive a reminder letter in the mail two months in advance. If you don't receive a letter, please call our office to schedule the follow-up appointment.   If you need a refill on your cardiac medications before your next appointment, please call your pharmacy.   Thank you for choosing [facility]! Andreyah Natividad, RN [phone]    

## 2016-01-04 NOTE — Addendum Note (Signed)
Addended by: Eulis Foster on: 01/04/2016 03:51 PM   Modules accepted: Orders

## 2016-01-04 NOTE — Progress Notes (Signed)
CARDIOLOGY OFFICE NOTE  Date:  01/04/2016    Carlos Juarez Date of Birth: 1941/02/26 Medical Record [id number]  PCP:  Dorothyann Peng, NP  Cardiologist:  Former patient of Dr. Sherryl Barters  , now [place]   Chief Complaint  Patient presents with  . Hypertension   Problem list 1. Essential hypertension 2. Hyperlipidemia 3. Hypothyroidism   Notes from Truitt Merle.  Carlos Juarez is a 75 y.o. male who presents today for a 4 month check. Former patient of Dr. Mare Ferrari.   He has a history of essential hypertension and a history of hypercholesterolemia. He does not have any history of ischemic heart disease. He had a normal treadmill Cardiolite stress test on 07/24/05 which showed no ischemia and his ejection fraction was 57% with no wall motion abnormalities. He also has a history of hypothyroid disease.   Last seen back in November - cardiac status was felt to be stable. Had had a fall.   Comes back today. Here alone. Remains on NSAID. BP high here today but says he has good control at home. Asking for his Xanax refill - would defer to PCP. He had his labs done earlier this month - those are reviewed. Blood sugar creeping up. He says he is walking most days but knows he needs to lose weight. No chest pain. Breathing is good.   Sept. 21, 2017:  Carlos Juarez is seen today for the first time.  Transfer from [place] .  BP is normal at home.   Is elevated here.  No CP or dyspnea  Works out with a Clinical research associate.  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    Past Medical History:  Diagnosis Date  . HTN (hypertension)   . Hypercholesteremia   . Hypothyroidism   . PVC's (premature ventricular contractions)     Past Surgical History:  Procedure Laterality Date  . HERNIA REPAIR    . MENISCUS REPAIR Left   . TONSILLECTOMY       Medications: Current Outpatient Prescriptions  Medication Sig Dispense Refill  . ALPRAZolam (XANAX)  0.5 MG tablet Take 0.5 mg by mouth at bedtime as needed for anxiety (sleep).    Marland Kitchen aspirin 81 MG tablet Take 81 mg by mouth daily.      Marland Kitchen atenolol (TENORMIN) 50 MG tablet Take 50 mg by mouth daily.      . celecoxib (CELEBREX) 200 MG capsule Take 200 mg by mouth 2 (two) times daily.     . Coenzyme Q10 (COQ10 PO) Take 2 mLs by mouth daily.     Marland Kitchen ezetimibe-simvastatin (VYTORIN) 10-20 MG tablet Take 1 tablet by mouth at bedtime. 90 tablet 2  . fish oil-omega-3 fatty acids 1000 MG capsule Take 2 g by mouth daily. Taking liquid 1 tsp daily    . levothyroxine (SYNTHROID, LEVOTHROID) 150 MCG tablet Take 150 mcg by mouth daily.       No current facility-administered medications for this visit.     Allergies: Allergies  Allergen Reactions  . Epinephrine Other (See Comments)    Light headness    Social History: The patient  reports that he has quit smoking. He does not have any smokeless tobacco history on file. He reports that he drinks alcohol.   Family History: The patient's family history includes Heart attack (age of onset: 95) in his father; Heart disease in his father; Hypertension in his mother.   Review of Systems: Please see  the history of present illness.   Otherwise, the review of systems is positive for none.   All other systems are reviewed and negative.   Physical Exam: VS:  BP (!) 164/94 (BP Location: Left Arm, Patient Position: Sitting, Cuff Size: Normal)   Pulse 71   Ht 6' (1.829 m)   Wt 199 lb 12.8 oz (90.6 kg)   BMI 27.10 kg/m  .  BMI Body mass index is 27.1 kg/m.  Wt Readings from Last 3 Encounters:  01/04/16 199 lb 12.8 oz (90.6 kg)  07/11/15 215 lb 6.4 oz (97.7 kg)  02/22/15 215 lb 6.4 oz (97.7 kg)   BP recheck by me is 140/80  General: Pleasant. Well developed, well nourished and in no acute distress.   HEENT: Normal.  Neck: Supple, no JVD, carotid bruits, or masses noted.  Cardiac: Regular rate and rhythm. No murmurs, rubs, or gallops. No edema.    Respiratory:  Lungs are clear to auscultation bilaterally with normal work of breathing.  GI: Soft and nontender.  MS: No deformity or atrophy. Gait and ROM intact.  Skin: Warm and dry. Color is normal.  Neuro:  Strength and sensation are intact and no gross focal deficits noted.  Psych: Alert, appropriate and with normal affect.   LABORATORY DATA:  EKG:  EKG is ordered today.   Sept. 21, 2017:  NSR with sinus arrhythmia.  Otherwise normal ECG  Lab Results  Component Value Date   GLUCOSE 129 (H) 06/30/2015   CHOL 181 06/30/2015   TRIG 119 06/30/2015   HDL 77 06/30/2015   LDLDIRECT 103.7 01/03/2012   LDLCALC 80 06/30/2015   ALT 26 06/30/2015   AST 22 06/30/2015   NA 139 06/30/2015   K 4.2 06/30/2015   CL 100 06/30/2015   CREATININE 1.14 06/30/2015   BUN 22 06/30/2015   CO2 29 06/30/2015   TSH 0.58 06/30/2015    BNP (last 3 results) No results for input(s): BNP in the last 8760 hours.  ProBNP (last 3 results) No results for input(s): PROBNP in the last 8760 hours.   Other Studies Reviewed Today:   Assessment/Plan: 1. Benign hypertensive heart disease without heart failure - he has better BP control at home - I have asked him to continue to monitor.  No change in his current therapy for now.   2. Hypercholesterolemia - on statin -  Labs today and in 6 months   4. Hypothyroidism - on replacement Will check TSH today .   Encouraged him to find a primary medical doctor   The following changes have been made:  See above.  Labs/ tests ordered today include:   No orders of the defined types were placed in this encounter.    Mertie Moores, MD  01/04/2016 3:37 PM    [address],  [place], [place]  [address] Pager [phone] Phone: [phone]; Fax: [phone]

## 2016-01-24 DIAGNOSIS — Z23 Encounter for immunization: Secondary | ICD-10-CM | POA: Diagnosis not present

## 2016-02-26 ENCOUNTER — Other Ambulatory Visit: Payer: Self-pay | Admitting: Cardiovascular Disease

## 2016-02-26 MED ORDER — EZETIMIBE-SIMVASTATIN 10-20 MG PO TABS: 1.0000 | ORAL_TABLET | Freq: Every day | ORAL | 2 refills | Status: DC

## 2016-03-21 DIAGNOSIS — M9902 Segmental and somatic dysfunction of thoracic region: Secondary | ICD-10-CM | POA: Diagnosis not present

## 2016-03-21 DIAGNOSIS — M5134 Other intervertebral disc degeneration, thoracic region: Secondary | ICD-10-CM | POA: Diagnosis not present

## 2016-03-21 DIAGNOSIS — M5431 Sciatica, right side: Secondary | ICD-10-CM | POA: Diagnosis not present

## 2016-03-21 DIAGNOSIS — M5136 Other intervertebral disc degeneration, lumbar region: Secondary | ICD-10-CM | POA: Diagnosis not present

## 2016-03-21 DIAGNOSIS — M9905 Segmental and somatic dysfunction of pelvic region: Secondary | ICD-10-CM | POA: Diagnosis not present

## 2016-03-21 DIAGNOSIS — M9903 Segmental and somatic dysfunction of lumbar region: Secondary | ICD-10-CM | POA: Diagnosis not present

## 2016-03-26 DIAGNOSIS — M9903 Segmental and somatic dysfunction of lumbar region: Secondary | ICD-10-CM | POA: Diagnosis not present

## 2016-03-26 DIAGNOSIS — M9902 Segmental and somatic dysfunction of thoracic region: Secondary | ICD-10-CM | POA: Diagnosis not present

## 2016-03-26 DIAGNOSIS — M5134 Other intervertebral disc degeneration, thoracic region: Secondary | ICD-10-CM | POA: Diagnosis not present

## 2016-03-26 DIAGNOSIS — M5136 Other intervertebral disc degeneration, lumbar region: Secondary | ICD-10-CM | POA: Diagnosis not present

## 2016-03-26 DIAGNOSIS — M9905 Segmental and somatic dysfunction of pelvic region: Secondary | ICD-10-CM | POA: Diagnosis not present

## 2016-03-26 DIAGNOSIS — M5431 Sciatica, right side: Secondary | ICD-10-CM | POA: Diagnosis not present

## 2016-04-02 DIAGNOSIS — G5601 Carpal tunnel syndrome, right upper limb: Secondary | ICD-10-CM | POA: Diagnosis not present

## 2016-04-02 DIAGNOSIS — M1712 Unilateral primary osteoarthritis, left knee: Secondary | ICD-10-CM | POA: Diagnosis not present

## 2016-04-02 DIAGNOSIS — M1711 Unilateral primary osteoarthritis, right knee: Secondary | ICD-10-CM | POA: Diagnosis not present

## 2016-05-02 DIAGNOSIS — H25813 Combined forms of age-related cataract, bilateral: Secondary | ICD-10-CM | POA: Diagnosis not present

## 2016-05-02 DIAGNOSIS — H01001 Unspecified blepharitis right upper eyelid: Secondary | ICD-10-CM | POA: Diagnosis not present

## 2016-05-02 DIAGNOSIS — H43813 Vitreous degeneration, bilateral: Secondary | ICD-10-CM | POA: Diagnosis not present

## 2016-05-02 DIAGNOSIS — H52203 Unspecified astigmatism, bilateral: Secondary | ICD-10-CM | POA: Diagnosis not present

## 2016-05-03 ENCOUNTER — Telehealth: Payer: Self-pay | Admitting: Cardiovascular Disease

## 2016-05-03 MED ORDER — VYTORIN 10-20 MG PO TABS: 1.0000 | ORAL_TABLET | Freq: Every day | ORAL | 3 refills | Status: DC

## 2016-05-03 NOTE — Telephone Encounter (Signed)
Carlos Juarez is calling because he wants to switch back to the name brand of the Vytorin . Please call

## 2016-05-03 NOTE — Telephone Encounter (Signed)
If you would go ahead and order Brand Vytorin, they will send me a request for a PA. This is the preferred method.

## 2016-05-03 NOTE — Telephone Encounter (Signed)
Sent in prescription for brand name Vytorin.  Spoke with pt and made him aware.  Advised I will send message to PA nurse to follow up with the PA needed for brand name.  Pt appreciative for assistance.

## 2016-05-03 NOTE — Telephone Encounter (Signed)
Pt states he was switched to generic Vytorin and has been taking this since August.  States causes stomach and hip discomfort.  Pt would like to switch back to name brand Vytorin if Dr. Acie Fredrickson is agreeable.  Will route to Dr. Acie Fredrickson for approval to switch pt back to name brand Vytorin.  Pt spoke with [facility] and they told him this would require a Prior Auth.  Advised pt I would send message to PA nurse.  Pt provided number [phone] for PA and they told him we would need to specify automatic renewal.

## 2016-05-03 NOTE — Telephone Encounter (Signed)
He may switch back to generic vytorin if he would like

## 2016-05-08 ENCOUNTER — Telehealth: Payer: Self-pay | Admitting: Cardiovascular Disease

## 2016-05-08 ENCOUNTER — Telehealth: Payer: Self-pay

## 2016-05-08 NOTE — Telephone Encounter (Signed)
PA for Vytorin done with [facility] pharmacy technician, Glenetta Hew via telephone. He states Vytorin will be dispensed to the patient and thanked me for the call

## 2016-05-08 NOTE — Telephone Encounter (Signed)
New Message    PER the pt:  Need the name brand of  VYTORIN 10-20 MG tablet Take 1 tablet by mouth daily.   The pharmacy put him on generic and he is having problems with I and they wont change him back to name brand without speaking to doctor.    [name] [phone]

## 2016-05-08 NOTE — Telephone Encounter (Signed)
Prior auth for Vytorin 10/20 submitted to [facility].

## 2016-05-08 NOTE — Telephone Encounter (Signed)
Prior auth for Vytorin 10/20 submitted to [facility].

## 2016-05-10 ENCOUNTER — Telehealth: Payer: Self-pay | Admitting: Cardiovascular Disease

## 2016-05-10 ENCOUNTER — Telehealth: Payer: Self-pay

## 2016-05-10 NOTE — Telephone Encounter (Signed)
Vytorin 10/20 approved by [facility]. It is valid till 2099. Patient notified.

## 2016-05-10 NOTE — Telephone Encounter (Signed)
Approved.  

## 2016-05-10 NOTE — Telephone Encounter (Signed)
Vaughan Basta, can you please check on this. I know it was faxed by you and again by me on Wednesday.  I'm not sure what the delay is.

## 2016-05-10 NOTE — Telephone Encounter (Signed)
New Message     Please call he is following up about message he sent earlier in week about Vytorin ,[facility] says they are waiting on you to respond to get medication filled

## 2016-05-14 ENCOUNTER — Telehealth: Payer: Self-pay

## 2016-05-14 DIAGNOSIS — M545 Low back pain: Secondary | ICD-10-CM | POA: Diagnosis not present

## 2016-05-14 DIAGNOSIS — M5441 Lumbago with sciatica, right side: Secondary | ICD-10-CM | POA: Diagnosis not present

## 2016-05-14 DIAGNOSIS — M25561 Pain in right knee: Secondary | ICD-10-CM | POA: Diagnosis not present

## 2016-05-14 DIAGNOSIS — M25562 Pain in left knee: Secondary | ICD-10-CM | POA: Diagnosis not present

## 2016-05-14 DIAGNOSIS — M25551 Pain in right hip: Secondary | ICD-10-CM | POA: Diagnosis not present

## 2016-05-14 NOTE — Telephone Encounter (Signed)
Fax from [facility] with a denial for Vytorin, after they had approved it. I called and spoke with a Rep at [facility] to find out what had happened. He thinks that 2 different  Requests were made for the drug. I assured him that I am the only one who would have done that. I told him I did one on-line, then received a questionnaire by fax, which was promptly sent back. Only thing left to do is appeal. I spoke with patient about this. He is not sure he wants to proceed. He will let me know what he wants to do.

## 2016-05-17 DIAGNOSIS — R200 Anesthesia of skin: Secondary | ICD-10-CM | POA: Diagnosis not present

## 2016-05-27 ENCOUNTER — Telehealth: Payer: Self-pay | Admitting: *Deleted

## 2016-05-27 ENCOUNTER — Other Ambulatory Visit: Payer: Self-pay | Admitting: *Deleted

## 2016-05-27 MED ORDER — EZETIMIBE-SIMVASTATIN 10-20 MG PO TABS: 1.0000 | ORAL_TABLET | Freq: Every day | ORAL | 2 refills | Status: DC

## 2016-05-27 NOTE — Telephone Encounter (Signed)
Yes, that is fine. 

## 2016-05-27 NOTE — Telephone Encounter (Signed)
Patient called and requested to change the vytorin back to generic. Okay to make this change? Please advise. Thanks, MI

## 2016-06-05 DIAGNOSIS — G5601 Carpal tunnel syndrome, right upper limb: Secondary | ICD-10-CM | POA: Diagnosis not present

## 2016-06-06 DIAGNOSIS — G5601 Carpal tunnel syndrome, right upper limb: Secondary | ICD-10-CM | POA: Diagnosis not present

## 2016-06-10 ENCOUNTER — Telehealth: Payer: Self-pay | Admitting: Nurse Practitioner

## 2016-06-10 NOTE — Telephone Encounter (Signed)
Carlos Juarez,   Patient called in asking about Surgical Clearance. His Surgery is Wednesday 06/12/16 on the Hand.  Asked if something could be faxed over.   [facility]-Dr.John Penne Lash) [phone]   Thanks, Kim   Clearance received from [facility] and signed by Dr. Acie Fredrickson. He advised patient may hold ASA 7 days prior to procedure. I spoke with the patient and he states his surgery is scheduled for Wed. 2/28. He states he was not advised to hold ASA but will not take it tonight or tomorrow night. He thanked me for the call.   Clearance faxed to [phone] and confirmation received

## 2016-06-12 DIAGNOSIS — G5601 Carpal tunnel syndrome, right upper limb: Secondary | ICD-10-CM | POA: Diagnosis not present

## 2016-06-20 DIAGNOSIS — G5601 Carpal tunnel syndrome, right upper limb: Secondary | ICD-10-CM | POA: Diagnosis not present

## 2016-06-26 ENCOUNTER — Encounter: Payer: Self-pay | Admitting: Cardiovascular Disease

## 2016-06-28 ENCOUNTER — Other Ambulatory Visit: Payer: Medicare Other | Admitting: *Deleted

## 2016-06-28 ENCOUNTER — Other Ambulatory Visit: Payer: Self-pay | Admitting: Cardiovascular Disease

## 2016-06-28 DIAGNOSIS — E782 Mixed hyperlipidemia: Secondary | ICD-10-CM

## 2016-06-28 DIAGNOSIS — Z9889 Other specified postprocedural states: Secondary | ICD-10-CM | POA: Diagnosis not present

## 2016-06-29 LAB — LIPID PANEL
CHOLESTEROL TOTAL: 197 mg/dL (ref 100–199)
Chol/HDL Ratio: 2.8 ratio units (ref 0.0–5.0)
HDL: 71 mg/dL (ref >39)
LDL Calculated: 101 mg/dL — ABNORMAL HIGH (ref 0–99)
TRIGLYCERIDES: 127 mg/dL (ref 0–149)
VLDL CHOLESTEROL CAL: 25 mg/dL (ref 5–40)

## 2016-06-29 LAB — HEPATIC FUNCTION PANEL
ALK PHOS: 60 IU/L (ref 39–117)
ALT: 26 IU/L (ref 0–44)
AST: 27 IU/L (ref 0–40)
Albumin: 3.7 g/dL (ref 3.5–4.8)
Bilirubin Total: 1.2 mg/dL (ref 0.0–1.2)
Bilirubin, Direct: 0.26 mg/dL (ref 0.00–0.40)
Total Protein: 6.1 g/dL (ref 6.0–8.5)

## 2016-06-29 LAB — BASIC METABOLIC PANEL
BUN/Creatinine Ratio: 17 (ref 10–24)
BUN: 18 mg/dL (ref 8–27)
CO2: 24 mmol/L (ref 18–29)
CREATININE: 1.04 mg/dL (ref 0.76–1.27)
Calcium: 9.1 mg/dL (ref 8.6–10.2)
Chloride: 100 mmol/L (ref 96–106)
GFR, EST AFRICAN AMERICAN: 81 mL/min/{1.73_m2} (ref >59)
GFR, EST NON AFRICAN AMERICAN: 70 mL/min/{1.73_m2} (ref >59)
Glucose: 113 mg/dL — ABNORMAL HIGH (ref 65–99)
Potassium: 4.1 mmol/L (ref 3.5–5.2)
SODIUM: 141 mmol/L (ref 134–144)

## 2016-07-03 ENCOUNTER — Ambulatory Visit (INDEPENDENT_AMBULATORY_CARE_PROVIDER_SITE_OTHER): Payer: Medicare Other | Admitting: Cardiovascular Disease

## 2016-07-03 ENCOUNTER — Encounter: Payer: Self-pay | Admitting: Cardiovascular Disease

## 2016-07-03 VITALS — BP 136/90 | HR 60 | Ht 72.0 in | Wt 208.6 lb

## 2016-07-03 DIAGNOSIS — I10 Essential (primary) hypertension: Secondary | ICD-10-CM

## 2016-07-03 DIAGNOSIS — E782 Mixed hyperlipidemia: Secondary | ICD-10-CM

## 2016-07-03 NOTE — Progress Notes (Signed)
CARDIOLOGY OFFICE NOTE  Date:  07/03/2016    Carlos Juarez Date of Birth: 04/27/1940 Medical Record #[id number]  PCP:  Dorothyann Peng, NP  Cardiologist:  Former patient of Dr. Sherryl Barters  , now [place]   Chief Complaint  Patient presents with  . Hyperlipidemia   Problem list 1. Essential hypertension 2. Hyperlipidemia 3. Hypothyroidism   Notes from Truitt Merle.  Carlos Juarez is a 76 y.o. male who presents today for a 4 month check. Former patient of Dr. Mare Ferrari.   He has a history of essential hypertension and a history of hypercholesterolemia. He does not have any history of ischemic heart disease. He had a normal treadmill Cardiolite stress test on 07/24/05 which showed no ischemia and his ejection fraction was 57% with no wall motion abnormalities. He also has a history of hypothyroid disease.   Last seen back in November - cardiac status was felt to be stable. Had had a fall.   Comes back today. Here alone. Remains on NSAID. BP high here today but says he has good control at home. Asking for his Xanax refill - would defer to PCP. He had his labs done earlier this month - those are reviewed. Blood sugar creeping up. He says he is walking most days but knows he needs to lose weight. No chest pain. Breathing is good.   Sept. 21, 2017:  [name] is seen today for the first time.  Transfer from [place] .  BP is normal at home.   Is elevated here.  No CP or dyspnea  Works out with a Clinical research associate.  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   July 03, 2016   Doing well from a cardiac standpoint. Had carpal tunnel surgery on his right hand several weeks ago. Seems to be healing up nicely. His blood pressure and heart rate are well controlled.  He's back working out with his trainer on a regular basis. His blood pressure at home is very well-controlled. His blood pressure here today in the office is slightly  elevated. Weight and chol levels have increased   Past Medical History:  Diagnosis Date  . HTN (hypertension)   . Hypercholesteremia   . Hypothyroidism   . PVC's (premature ventricular contractions)     Past Surgical History:  Procedure Laterality Date  . HERNIA REPAIR    . MENISCUS REPAIR Left   . TONSILLECTOMY       Medications: Current Outpatient Prescriptions  Medication Sig Dispense Refill  . aspirin 81 MG tablet Take 81 mg by mouth daily.      Marland Kitchen atenolol (TENORMIN) 50 MG tablet Take 50 mg by mouth daily.      . celecoxib (CELEBREX) 200 MG capsule Take 200 mg by mouth 3 times/day as needed-between meals & bedtime.     . Coenzyme Q10 (COQ10 PO) Take 2 mLs by mouth daily.     Marland Kitchen ezetimibe-simvastatin (VYTORIN) 10-20 MG tablet Take 1 tablet by mouth daily. 90 tablet 2  . fish oil-omega-3 fatty acids 1000 MG capsule Take 2 g by mouth daily. Taking liquid 1 tsp daily    . levothyroxine (SYNTHROID, LEVOTHROID) 150 MCG tablet Take 150 mcg by mouth daily.      Marland Kitchen ALPRAZolam (XANAX) 0.5 MG tablet Take 0.5 mg by mouth at bedtime as needed for anxiety (sleep).     No current facility-administered medications for this visit.     Allergies: Allergies  Allergen Reactions  . Epinephrine Other (See Comments)    Light headness    Social History: The patient  reports that he has quit smoking. He has never used smokeless tobacco. He reports that he drinks alcohol. He reports that he does not use drugs.   Family History: The patient's family history includes Heart attack (age of onset: 39) in his father; Heart disease in his father; Hypertension in his mother.   Review of Systems: Please see the history of present illness.   Otherwise, the review of systems is positive for none.   All other systems are reviewed and negative.   Physical Exam: VS:  BP 136/90 (BP Location: Left Arm, Patient Position: Sitting, Cuff Size: Large)   Pulse 60   Ht 6' (1.829 m)   Wt 208 lb 9.6 oz (94.6  kg)   SpO2 97%   BMI 28.29 kg/m  .  BMI Body mass index is 28.29 kg/m.  Wt Readings from Last 3 Encounters:  07/03/16 208 lb 9.6 oz (94.6 kg)  01/04/16 199 lb 12.8 oz (90.6 kg)  07/11/15 215 lb 6.4 oz (97.7 kg)   BP recheck by me is 140/80  General: Pleasant. Well developed, well nourished and in no acute distress.   HEENT: Normal.  Neck: Supple, no JVD, carotid bruits, or masses noted.  Cardiac: Regular rate and rhythm. No murmurs, rubs, or gallops. No edema.  Respiratory:  Lungs are clear to auscultation bilaterally with normal work of breathing.  GI: Soft and nontender.  MS: No deformity or atrophy. Gait and ROM intact.  Skin: Warm and dry. Color is normal.  Neuro:  Strength and sensation are intact and no gross focal deficits noted.  Psych: Alert, appropriate and with normal affect.   LABORATORY DATA:  EKG:  EKG is ordered today.   Sept. 21, 2017:  NSR with sinus arrhythmia.  Otherwise normal ECG  Lab Results  Component Value Date   GLUCOSE 113 (H) 06/28/2016   CHOL 197 06/28/2016   TRIG 127 06/28/2016   HDL 71 06/28/2016   LDLDIRECT 103.7 01/03/2012   LDLCALC 101 (H) 06/28/2016   ALT 26 06/28/2016   AST 27 06/28/2016   NA 141 06/28/2016   K 4.1 06/28/2016   CL 100 06/28/2016   CREATININE 1.04 06/28/2016   BUN 18 06/28/2016   CO2 24 06/28/2016   TSH 0.58 06/30/2015    BNP (last 3 results) No results for input(s): BNP in the last 8760 hours.  ProBNP (last 3 results) No results for input(s): PROBNP in the last 8760 hours.   Other Studies Reviewed Today:   Assessment/Plan: 1. Benign hypertensive heart disease without heart failure - he has better BP control at home - I have asked him to continue to monitor.  No change in his current therapy for now.   2. Hypercholesterolemia - on Vytorin   Labs   in 6 months   4. Hypothyroidism - on replacement Will check TSH today .     The following changes have been made:  See above.  Labs/ tests ordered today  include:   No orders of the defined types were placed in this encounter.    Mertie Moores, MD  07/03/2016 2:26 PM    [address],  [place], [place]  [address] Pager [phone] Phone: [phone]; Fax: [phone]

## 2016-07-03 NOTE — Patient Instructions (Signed)

## 2016-07-17 DIAGNOSIS — Z86010 Personal history of colonic polyps: Secondary | ICD-10-CM | POA: Diagnosis not present

## 2016-07-17 DIAGNOSIS — K5730 Diverticulosis of large intestine without perforation or abscess without bleeding: Secondary | ICD-10-CM | POA: Diagnosis not present

## 2016-07-17 LAB — HM COLONOSCOPY

## 2016-07-23 DIAGNOSIS — G5602 Carpal tunnel syndrome, left upper limb: Secondary | ICD-10-CM | POA: Diagnosis not present

## 2016-07-30 ENCOUNTER — Encounter: Payer: Self-pay | Admitting: Family Medicine

## 2016-08-06 DIAGNOSIS — Z85828 Personal history of other malignant neoplasm of skin: Secondary | ICD-10-CM | POA: Diagnosis not present

## 2016-08-06 DIAGNOSIS — L720 Epidermal cyst: Secondary | ICD-10-CM | POA: Diagnosis not present

## 2016-08-06 DIAGNOSIS — L821 Other seborrheic keratosis: Secondary | ICD-10-CM | POA: Diagnosis not present

## 2016-08-06 DIAGNOSIS — D1801 Hemangioma of skin and subcutaneous tissue: Secondary | ICD-10-CM | POA: Diagnosis not present

## 2016-09-02 DIAGNOSIS — K5732 Diverticulitis of large intestine without perforation or abscess without bleeding: Secondary | ICD-10-CM | POA: Diagnosis not present

## 2016-09-02 LAB — CBC AND DIFFERENTIAL
HEMATOCRIT: 48 % (ref 41–53)
HEMOGLOBIN: 16.9 g/dL (ref 13.5–17.5)
NEUTROS ABS: 8 /uL
Platelets: 212 10*3/uL (ref 150–399)
WBC: 10.9 10^3/mL

## 2016-09-06 ENCOUNTER — Encounter: Payer: Self-pay | Admitting: Family Medicine

## 2016-09-18 ENCOUNTER — Ambulatory Visit (INDEPENDENT_AMBULATORY_CARE_PROVIDER_SITE_OTHER): Payer: Medicare Other

## 2016-09-18 VITALS — BP 136/90 | HR 53 | Ht 72.0 in | Wt 212.0 lb

## 2016-09-18 DIAGNOSIS — Z0000 Encounter for general adult medical examination without abnormal findings: Secondary | ICD-10-CM | POA: Diagnosis not present

## 2016-09-18 NOTE — Progress Notes (Signed)
Subjective:   Carlos Juarez is a 76 y.o. male who presents for Medicare Annual/Subsequent preventive examination.  The Patient was informed that the wellness visit is to identify future health risk and educate and initiate measures that can reduce risk for increased disease through the lifespan.    NO ROS; Medicare Wellness Visit  Describes health as good, fair or great? Good What would make it great New knees and new hip would help.  No plans to have surgery yet Sees Dr. Dorna Leitz  Preventive Screening -Counseling & Management  Tdap/ will check at the [facility] date given Pneumonia; has had at least 2 at the [facility] Will try to get dates for immunizations Has also taken the zostavax; had light case of chickenpox after taking  Colonoscopy 07/2016; 07/2016 (recent episode of diverticulitis)    Smoking history - former smoker Quit April 1976;  Also educated regarding AAA for male tobacco users with smoking hx - states this was complete at the [place] and was   neg   Smokeless tobacco / no  Second Hand Smoke status; No Smokers in the home ETOH - wine with meals/ may stop at hs to see if this helps his sleep   Medication adherence or issues? No issues  Epinephrine; sensitive to this Needs refill on Xanax 0.5 still Dr. Tonia Brooms retired and cardiologist does not fill Tommi Rumps does not refill due to se and risk This was explained to Carlos Juarez and he only took this rarely; will make apt with Tommi Rumps for future sleep issues if necessary.   RISK FACTORS Diet/ bb and yogurt Lunch; salad Dinner; eats in  Vegetables and meat  No carbs;   Regular exercise  Has always exercises Works out with a trainer x 2 per week Cardio; rowing machine and bike 30 minutes; prior to trainer    Cardiac Risk Factors:  Advanced aged > 44 in men;  Hyperlipidemia - awesome Diabetes fasting up some, states he was on prednisone at the time;  Family History - htn; MI Obesity would like to lose weight;  Offered  ideas to track food intake for a few days to get an idea of how many calories he is eating  Fall risk not in 2 years;  States he feel carrying boxes down stairs  Given education on "Fall Prevention in the Home" for more safety tips the patient can apply as appropriate.   Lives in 4 story town home; has Financial trader of Functional changes this year? No Knees slows him down some as he did walk   Mental Health:  Any emotional problems? Anxious, depressed, irritable, sad or blue? no Denies feeling depressed or hopeless; voices pleasure in daily life How many social activities have you been engaged in within the last 2 weeks? no  Hearing Screening Comments: Hearing is checked by [employer]  Hearing aids are helping  Vision Screening Comments: Vision  Dr. Kathrin Penner; one time a year Monitoring cataract on OD   Activities of Daily Living - See functional screen   Cognitive testing; Ad8 score; 0 or less than 2  MMSE deferred or completed if AD8 + 2 issues  Advanced Directives -completed   Patient Care Team: Dorothyann Peng, NP as PCP - General (Family Medicine)   Immunization History  Administered Date(s) Administered  . Influenza,inj,Quad PF,36+ Mos 01/02/2015  . Influenza-Unspecified 02/13/2013   Required Immunizations needed today  Screening test up to date or reviewed for plan of completion Health Maintenance Due  Topic Date  Due  . TETANUS/TDAP  06/18/1960  . PNA vac Low Risk Adult (1 of 2 - PCV13) 06/19/2006   Feels he has had both of these at the [place] Will get  His records and let the office know the dates  Will postpone for now       Objective:    Vitals: BP 136/90   Pulse (!) 53   Ht 6' (1.829 m)   Wt 212 lb (96.2 kg)   SpO2 94%   BMI 28.75 kg/m   Body mass index is 28.75 kg/m.  Tobacco History  Smoking Status  . Former Smoker  . Packs/day: 1.00  . Years: 15.00  . Quit date: 07/15/1974  Smokeless Tobacco  . Never Used    Comment: smoked heavy 15  years and then quit     Counseling given: Yes   Past Medical History:  Diagnosis Date  . HTN (hypertension)   . Hypercholesteremia   . Hypothyroidism   . PVC's (premature ventricular contractions)    Past Surgical History:  Procedure Laterality Date  . HERNIA REPAIR    . MENISCUS REPAIR Left   . TONSILLECTOMY     Family History  Problem Relation Age of Onset  . Hypertension Mother   . Heart disease Father   . Heart attack Father 32   History  Sexual Activity  . Sexual activity: Not on file    Outpatient Encounter Prescriptions as of 09/18/2016  Medication Sig  . atenolol (TENORMIN) 50 MG tablet Take 50 mg by mouth daily.    . celecoxib (CELEBREX) 200 MG capsule Take 200 mg by mouth 3 times/day as needed-between meals & bedtime.   . Coenzyme Q10 (COQ10 PO) Take 2 mLs by mouth daily.   Marland Kitchen ezetimibe-simvastatin (VYTORIN) 10-20 MG tablet Take 1 tablet by mouth daily.  . fish oil-omega-3 fatty acids 1000 MG capsule Take 2 g by mouth daily. Taking liquid 1 tsp daily  . levothyroxine (SYNTHROID, LEVOTHROID) 150 MCG tablet Take 150 mcg by mouth daily.    Marland Kitchen aspirin 81 MG tablet Take 81 mg by mouth daily.    . [DISCONTINUED] ALPRAZolam (XANAX) 0.5 MG tablet Take 0.5 mg by mouth at bedtime as needed for anxiety (sleep).   No facility-administered encounter medications on file as of 09/18/2016.     Activities of Daily Living No flowsheet data found.  Patient Care Team: Dorothyann Peng, NP as PCP - General (Family Medicine)   Assessment:    Plan: weight loss as discussed in Goal  Exercise Activities and Dietary recommendations   Great exercise program and will continue   Goals    . Weight (lb) < 205 lb (93 kg)          Weight loss  You can track food for 3 to 4 days Then can check calories with calorieking.com   Check out  online nutrition programs as GumSearch.nl and http://vang.com/; fit33me; Look for foods with "whole" wheat; bran; oatmeal etc Shot at the  farmer's markets in season for fresher choices  Watch for "hydrogenated" on the label of oils which are trans-fats.  Watch for "high fructose corn syrup" in snacks, yogurt or ketchup  Meats have less marbling; bright colored fruits and vegetables;  Canned; dump out liquid and wash vegetables. Be mindful of what we are eating  Portion control is essential to a health weight! Sit down; take a break and enjoy your meal; take smaller bites; put the fork down between bites;  It takes 20 minutes  to get full; so check in with your fullness cues and stop eating when you start to fill full             Fall Risk Fall Risk  09/18/2016 01/02/2015  Falls in the past year? No No   Depression Screen PHQ 2/9 Scores 09/18/2016 01/02/2015  PHQ - 2 Score 0 0          Immunization History  Administered Date(s) Administered  . Influenza,inj,Quad PF,36+ Mos 01/02/2015  . Influenza-Unspecified 02/13/2013   Screening Tests Health Maintenance  Topic Date Due  . TETANUS/TDAP  06/18/1960  . PNA vac Low Risk Adult (1 of 2 - PCV13) 06/19/2006  . INFLUENZA VACCINE  11/13/2016  . COLONOSCOPY  07/18/2026      Plan:    PCP Notes  Health Maintenance Postponed Tdap and pneumonia vaccines as he feels he has these at the [place]  Will check with the [facility] regarding the shingrix  (had zoster)   Abnormal Screens; none  Referrals none  Patient concerns; None; checked on refilling xanax given by cardiology for sleep; the patient was educated on half life and se and decided he was fine. He only took this rarely   Nurse Concerns; none  Next PCP apt as needed     I have personally reviewed and noted the following in the patient's chart:   . Medical and social history . Use of alcohol, tobacco or illicit drugs  . Current medications and supplements . Functional ability and status . Nutritional status . Physical activity . Advanced directives . List of other physicians . Hospitalizations,  surgeries, and ER visits in previous 12 months . Vitals . Screenings to include cognitive, depression, and falls . Referrals and appointments  In addition, I have reviewed and discussed with patient certain preventive protocols, quality metrics, and best practice recommendations. A written personalized care plan for preventive services as well as general preventive health recommendations were provided to patient.     Wynetta Fines, RN  09/18/2016

## 2016-09-18 NOTE — Progress Notes (Signed)
Erroneous note Please ignore

## 2016-09-18 NOTE — Patient Instructions (Addendum)
Carlos Juarez , Thank you for taking time to come for your Medicare Wellness Visit. I appreciate your ongoing commitment to your health goals. Please review the following plan we discussed and let me know if I can assist you in the future.    Also need record of your pneumonia vaccines; will postpone them for now   A Tetanus is recommended every 10 years. Medicare covers a tetanus if you have a cut or wound; otherwise, there may be a charge. If you had not had a tetanus with pertusses, known as the Tdap, you can take this anytime.  Also need pneumonia vaccination hx   Shingrix is a vaccine for the prevention of Shingles in Adults 50 and older.  If you are on Medicare, you can request a prescription from your doctor to be filled at a pharmacy.  Please check with your benefits regarding applicable copays or out of pocket expenses.  The Shingrix is given in 2 vaccines approx 8 weeks apart. You must receive the 2nd dose prior to 6 months from receipt of the first.      These are the goals we discussed: Goals    . Weight (lb) < 205 lb (93 kg)          Weight loss  You can track food for 3 to 4 days Then can check calories with calorieking.com   Check out  online nutrition programs as GumSearch.nl and http://vang.com/; fit44me; Look for foods with "whole" wheat; bran; oatmeal etc Shot at the farmer's markets in season for fresher choices  Watch for "hydrogenated" on the label of oils which are trans-fats.  Watch for "high fructose corn syrup" in snacks, yogurt or ketchup  Meats have less marbling; bright colored fruits and vegetables;  Canned; dump out liquid and wash vegetables. Be mindful of what we are eating  Portion control is essential to a health weight! Sit down; take a break and enjoy your meal; take smaller bites; put the fork down between bites;  It takes 20 minutes to get full; so check in with your fullness cues and stop eating when you start to fill full               This is a list of the screening recommended for you and due dates:  Health Maintenance  Topic Date Due  . Tetanus Vaccine  06/18/1960  . Pneumonia vaccines (1 of 2 - PCV13) 06/19/2006  . Flu Shot  11/13/2016  . Colon Cancer Screening  07/18/2026    Health Maintenance, Male A healthy lifestyle and preventive care is important for your health and wellness. Ask your health care provider about what schedule of regular examinations is right for you. What should I know about weight and diet? Eat a Healthy Diet  Eat plenty of vegetables, fruits, whole grains, low-fat dairy products, and lean protein.  Do not eat a lot of foods high in solid fats, added sugars, or salt.  Maintain a Healthy Weight Regular exercise can help you achieve or maintain a healthy weight. You should:  Do at least 150 minutes of exercise each week. The exercise should increase your heart rate and make you sweat (moderate-intensity exercise).  Do strength-training exercises at least twice a week.  Watch Your Levels of Cholesterol and Blood Lipids  Have your blood tested for lipids and cholesterol every 5 years starting at 75 years of age. If you are at high risk for heart disease, you should start having your blood tested when  you are 75 years old. You may need to have your cholesterol levels checked more often if: ? Your lipid or cholesterol levels are high. ? You are older than 75 years of age. ? You are at high risk for heart disease.  What should I know about cancer screening? Many types of cancers can be detected early and may often be prevented. Lung Cancer  You should be screened every year for lung cancer if: ? You are a current smoker who has smoked for at least 30 years. ? You are a former smoker who has quit within the past 15 years.  Talk to your health care provider about your screening options, when you should start screening, and how often you should be  screened.  Colorectal Cancer  Routine colorectal cancer screening usually begins at 75 years of age and should be repeated every 5-10 years until you are 75 years old. You may need to be screened more often if early forms of precancerous polyps or small growths are found. Your health care provider may recommend screening at an earlier age if you have risk factors for colon cancer.  Your health care provider may recommend using home test kits to check for hidden blood in the stool.  A small camera at the end of a tube can be used to examine your colon (sigmoidoscopy or colonoscopy). This checks for the earliest forms of colorectal cancer.  Prostate and Testicular Cancer  Depending on your age and overall health, your health care provider may do certain tests to screen for prostate and testicular cancer.  Talk to your health care provider about any symptoms or concerns you have about testicular or prostate cancer.  Skin Cancer  Check your skin from head to toe regularly.  Tell your health care provider about any new moles or changes in moles, especially if: ? There is a change in a mole's size, shape, or color. ? You have a mole that is larger than a pencil eraser.  Always use sunscreen. Apply sunscreen liberally and repeat throughout the day.  Protect yourself by wearing long sleeves, pants, a wide-brimmed hat, and sunglasses when outside.  What should I know about heart disease, diabetes, and high blood pressure?  If you are 69-37 years of age, have your blood pressure checked every 3-5 years. If you are 63 years of age or older, have your blood pressure checked every year. You should have your blood pressure measured twice-once when you are at a hospital or clinic, and once when you are not at a hospital or clinic. Record the average of the two measurements. To check your blood pressure when you are not at a hospital or clinic, you can use: ? An automated blood pressure machine at a  pharmacy. ? A home blood pressure monitor.  Talk to your health care provider about your target blood pressure.  If you are between 11-68 years old, ask your health care provider if you should take aspirin to prevent heart disease.  Have regular diabetes screenings by checking your fasting blood sugar level. ? If you are at a normal weight and have a low risk for diabetes, have this test once every three years after the age of 42. ? If you are overweight and have a high risk for diabetes, consider being tested at a younger age or more often.  A one-time screening for abdominal aortic aneurysm (AAA) by ultrasound is recommended for men aged 46-75 years who are current or former smokers.  What should I know about preventing infection? Hepatitis B If you have a higher risk for hepatitis B, you should be screened for this virus. Talk with your health care provider to find out if you are at risk for hepatitis B infection. Hepatitis C Blood testing is recommended for:  Everyone born from [birth year] through 1964.  Anyone with known risk factors for hepatitis C.  Sexually Transmitted Diseases (STDs)  You should be screened each year for STDs including gonorrhea and chlamydia if: ? You are sexually active and are younger than 75 years of age. ? You are older than 75 years of age and your health care provider tells you that you are at risk for this type of infection. ? Your sexual activity has changed since you were last screened and you are at an increased risk for chlamydia or gonorrhea. Ask your health care provider if you are at risk.  Talk with your health care provider about whether you are at high risk of being infected with HIV. Your health care provider may recommend a prescription medicine to help prevent HIV infection.  What else can I do?  Schedule regular health, dental, and eye exams.  Stay current with your vaccines (immunizations).  Do not use any tobacco products, such as  cigarettes, chewing tobacco, and e-cigarettes. If you need help quitting, ask your health care provider.  Limit alcohol intake to no more than 2 drinks per day. One drink equals 12 ounces of beer, 5 ounces of wine, or 1 ounces of hard liquor.  Do not use street drugs.  Do not share needles.  Ask your health care provider for help if you need support or information about quitting drugs.  Tell your health care provider if you often feel depressed.  Tell your health care provider if you have ever been abused or do not feel safe at home. This information is not intended to replace advice given to you by your health care provider. Make sure you discuss any questions you have with your health care provider. Document Released: 09/28/2007 Document Revised: 11/29/2015 Document Reviewed: 01/03/2015 Elsevier Interactive Patient Education  2018 [place] in the Home Falls can cause injuries and can affect people from all age groups. There are many simple things that you can do to make your home safe and to help prevent falls. What can I do on the outside of my home?  Regularly repair the edges of walkways and driveways and fix any cracks.  Remove high doorway thresholds.  Trim any shrubbery on the main path into your home.  Use bright outdoor lighting.  Clear walkways of debris and clutter, including tools and rocks.  Regularly check that handrails are securely fastened and in good repair. Both sides of any steps should have handrails.  Install guardrails along the edges of any raised decks or porches.  Have leaves, snow, and ice cleared regularly.  Use sand or salt on walkways during winter months.  In the garage, clean up any spills right away, including grease or oil spills. What can I do in the bathroom?  Use night lights.  Install grab bars by the toilet and in the tub and shower. Do not use towel bars as grab bars.  Use non-skid mats or decals on the  floor of the tub or shower.  If you need to sit down while you are in the shower, use a plastic, non-slip stool.  Keep the floor dry. Immediately clean up  any water that spills on the floor.  Remove soap buildup in the tub or shower on a regular basis.  Attach bath mats securely with double-sided non-slip rug tape.  Remove throw rugs and other tripping hazards from the floor. What can I do in the bedroom?  Use night lights.  Make sure that a bedside light is easy to reach.  Do not use oversized bedding that drapes onto the floor.  Have a firm chair that has side arms to use for getting dressed.  Remove throw rugs and other tripping hazards from the floor. What can I do in the kitchen?  Clean up any spills right away.  Avoid walking on wet floors.  Place frequently used items in easy-to-reach places.  If you need to reach for something above you, use a sturdy step stool that has a grab bar.  Keep electrical cables out of the way.  Do not use floor polish or wax that makes floors slippery. If you have to use wax, make sure that it is non-skid floor wax.  Remove throw rugs and other tripping hazards from the floor. What can I do in the stairways?  Do not leave any items on the stairs.  Make sure that there are handrails on both sides of the stairs. Fix handrails that are broken or loose. Make sure that handrails are as long as the stairways.  Check any carpeting to make sure that it is firmly attached to the stairs. Fix any carpet that is loose or worn.  Avoid having throw rugs at the top or bottom of stairways, or secure the rugs with carpet tape to prevent them from moving.  Make sure that you have a light switch at the top of the stairs and the bottom of the stairs. If you do not have them, have them installed. What are some other fall prevention tips?  Wear closed-toe shoes that fit well and support your feet. Wear shoes that have rubber soles or low heels.  When  you use a stepladder, make sure that it is completely opened and that the sides are firmly locked. Have someone hold the ladder while you are using it. Do not climb a closed stepladder.  Add color or contrast paint or tape to grab bars and handrails in your home. Place contrasting color strips on the first and last steps.  Use mobility aids as needed, such as canes, walkers, scooters, and crutches.  Turn on lights if it is dark. Replace any light bulbs that burn out.  Set up furniture so that there are clear paths. Keep the furniture in the same spot.  Fix any uneven floor surfaces.  Choose a carpet design that does not hide the edge of steps of a stairway.  Be aware of any and all pets.  Review your medicines with your healthcare provider. Some medicines can cause dizziness or changes in blood pressure, which increase your risk of falling. Talk with your health care provider about other ways that you can decrease your risk of falls. This may include working with a physical therapist or trainer to improve your strength, balance, and endurance. This information is not intended to replace advice given to you by your health care provider. Make sure you discuss any questions you have with your health care provider. Document Released: 03/22/2002 Document Revised: 08/29/2015 Document Reviewed: 05/06/2014 Elsevier Interactive Patient Education  2017 Carlos Juarez.   Insomnia Insomnia is a sleep disorder that makes it difficult to fall asleep or  to stay asleep. Insomnia can cause tiredness (fatigue), low energy, difficulty concentrating, mood swings, and poor performance at work or school. There are three different ways to classify insomnia:  Difficulty falling asleep.  Difficulty staying asleep.  Waking up too early in the morning.  Any type of insomnia can be long-term (chronic) or short-term (acute). Both are common. Short-term insomnia usually lasts for three months or less. Chronic  insomnia occurs at least three times a week for longer than three months. What are the causes? Insomnia may be caused by another condition, situation, or substance, such as:  Anxiety.  Certain medicines.  Gastroesophageal reflux disease (GERD) or other gastrointestinal conditions.  Asthma or other breathing conditions.  Restless legs syndrome, sleep apnea, or other sleep disorders.  Chronic pain.  Menopause. This may include hot flashes.  Stroke.  Abuse of alcohol, tobacco, or illegal drugs.  Depression.  Caffeine.  Neurological disorders, such as Alzheimer disease.  An overactive thyroid (hyperthyroidism).  The cause of insomnia may not be known. What increases the risk? Risk factors for insomnia include:  Gender. Women are more commonly affected than men.  Age. Insomnia is more common as you get older.  Stress. This may involve your professional or personal life.  Income. Insomnia is more common in people with lower income.  Lack of exercise.  Irregular work schedule or night shifts.  Traveling between different time zones.  What are the signs or symptoms? If you have insomnia, trouble falling asleep or trouble staying asleep is the main symptom. This may lead to other symptoms, such as:  Feeling fatigued.  Feeling nervous about going to sleep.  Not feeling rested in the morning.  Having trouble concentrating.  Feeling irritable, anxious, or depressed.  How is this treated? Treatment for insomnia depends on the cause. If your insomnia is caused by an underlying condition, treatment will focus on addressing the condition. Treatment may also include:  Medicines to help you sleep.  Counseling or therapy.  Lifestyle adjustments.  Follow these instructions at home:  Take medicines only as directed by your health care provider.  Keep regular sleeping and waking hours. Avoid naps.  Keep a sleep diary to help you and your health care provider  figure out what could be causing your insomnia. Include: ? When you sleep. ? When you wake up during the night. ? How well you sleep. ? How rested you feel the next day. ? Any side effects of medicines you are taking. ? What you eat and drink.  Make your bedroom a comfortable place where it is easy to fall asleep: ? Put up shades or special blackout curtains to block light from outside. ? Use a white noise machine to block noise. ? Keep the temperature cool.  Exercise regularly as directed by your health care provider. Avoid exercising right before bedtime.  Use relaxation techniques to manage stress. Ask your health care provider to suggest some techniques that may work well for you. These may include: ? Breathing exercises. ? Routines to release muscle tension. ? Visualizing peaceful scenes.  Cut back on alcohol, caffeinated beverages, and cigarettes, especially close to bedtime. These can disrupt your sleep.  Do not overeat or eat spicy foods right before bedtime. This can lead to digestive discomfort that can make it hard for you to sleep.  Limit screen use before bedtime. This includes: ? Watching TV. ? Using your smartphone, tablet, and computer.  Stick to a routine. This can help you fall asleep faster.  Try to do a quiet activity, brush your teeth, and go to bed at the same time each night.  Get out of bed if you are still awake after 15 minutes of trying to sleep. Keep the lights down, but try reading or doing a quiet activity. When you feel sleepy, go back to bed.  Make sure that you drive carefully. Avoid driving if you feel very sleepy.  Keep all follow-up appointments as directed by your health care provider. This is important. Contact a health care provider if:  You are tired throughout the day or have trouble in your daily routine due to sleepiness.  You continue to have sleep problems or your sleep problems get worse. Get help right away if:  You have serious  thoughts about hurting yourself or someone else. This information is not intended to replace advice given to you by your health care provider. Make sure you discuss any questions you have with your health care provider. Document Released: 03/29/2000 Document Revised: 09/01/2015 Document Reviewed: 12/31/2013 Elsevier Interactive Patient Education  [employer].

## 2016-09-18 NOTE — Progress Notes (Signed)
I have reviewed and agree with this plan  

## 2016-10-01 DIAGNOSIS — M25561 Pain in right knee: Secondary | ICD-10-CM | POA: Diagnosis not present

## 2016-10-01 DIAGNOSIS — M5441 Lumbago with sciatica, right side: Secondary | ICD-10-CM | POA: Diagnosis not present

## 2016-10-01 DIAGNOSIS — M25562 Pain in left knee: Secondary | ICD-10-CM | POA: Diagnosis not present

## 2016-10-01 DIAGNOSIS — M25551 Pain in right hip: Secondary | ICD-10-CM | POA: Diagnosis not present

## 2016-10-31 ENCOUNTER — Ambulatory Visit (INDEPENDENT_AMBULATORY_CARE_PROVIDER_SITE_OTHER): Payer: Medicare Other | Admitting: Adult Health

## 2016-10-31 ENCOUNTER — Encounter: Payer: Self-pay | Admitting: Adult Health

## 2016-10-31 VITALS — BP 120/80 | HR 55 | Temp 98.2°F | Wt 212.4 lb

## 2016-10-31 DIAGNOSIS — J069 Acute upper respiratory infection, unspecified: Secondary | ICD-10-CM

## 2016-10-31 MED ORDER — DOXYCYCLINE HYCLATE 100 MG PO CAPS: 100.0000 mg | ORAL_CAPSULE | Freq: Two times a day (BID) | ORAL | 0 refills | Status: DC

## 2016-10-31 NOTE — Progress Notes (Signed)
Subjective:    Patient ID: Carlos Juarez, male    DOB: [birth year], 76 y.o.   MRN: [id number]  HPI  76 year old male who  has a past medical history of HTN (hypertension); Hypercholesteremia; Hypothyroidism; and PVC's (premature ventricular contractions). He presents to the office today for the complaint of a productive cough, fatigue, and sore throat x 7 days. He reports that he went on a river cruise in [place] and when he returned to the [place] his symptoms started.   He denies any chills, n/v/d. He does report subjective fever.   He has been using OTC Allegra without any symptom relief    Review of Systems See HPI   Past Medical History:  Diagnosis Date  . HTN (hypertension)   . Hypercholesteremia   . Hypothyroidism   . PVC's (premature ventricular contractions)     Social History   Social History  . Marital status: Married    Spouse name: N/A  . Number of children: N/A  . Years of education: N/A   Occupational History  . Not on file.   Social History Main Topics  . Smoking status: Former Smoker    Packs/day: 1.00    Years: 15.00    Quit date: 07/15/1974  . Smokeless tobacco: Never Used     Comment: smoked heavy 15 years and then quit  . Alcohol use 0.0 oz/week     Comment: Wine with meals  . Drug use: No  . Sexual activity: Not on file   Other Topics Concern  . 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    Married for 49 years    Three children and 4 grandchildren.        Past Surgical History:  Procedure Laterality Date  . HERNIA REPAIR    . MENISCUS REPAIR Left   . TONSILLECTOMY      Family History  Problem Relation Age of Onset  . Hypertension Mother   . Heart disease Father   . Heart attack Father 70    Allergies  Allergen Reactions  . Epinephrine Other (See Comments)    Light headness    Current Outpatient Prescriptions on File  Prior to Visit  Medication Sig Dispense Refill  . aspirin 81 MG tablet Take 81 mg by mouth daily.      Marland Kitchen atenolol (TENORMIN) 50 MG tablet Take 50 mg by mouth daily.      . celecoxib (CELEBREX) 200 MG capsule Take 200 mg by mouth 3 times/day as needed-between meals & bedtime.     . Coenzyme Q10 (COQ10 PO) Take 2 mLs by mouth daily.     Marland Kitchen ezetimibe-simvastatin (VYTORIN) 10-20 MG tablet Take 1 tablet by mouth daily. 90 tablet 2  . fish oil-omega-3 fatty acids 1000 MG capsule Take 2 g by mouth daily. Taking liquid 1 tsp daily    . levothyroxine (SYNTHROID, LEVOTHROID) 150 MCG tablet Take 150 mcg by mouth daily.       No current facility-administered medications on file prior to visit.     BP 120/80 (BP Location: Left Arm, Patient Position: Sitting, Cuff Size: Normal)   Pulse (!) 55   Temp 98.2 F (36.8 C) (Oral)   Wt 212 lb 6.4 oz (96.3 kg)   SpO2 97%   BMI 28.81 kg/m       Objective:   Physical Exam  Constitutional: He is oriented to person, place, and time. He appears well-developed and well-nourished. No distress.  HENT:  Right Ear: Hearing, tympanic membrane, external ear and ear canal normal.  Left Ear: Hearing, tympanic membrane, external ear and ear canal normal.  Nose: Mucosal edema and rhinorrhea present. Right sinus exhibits no maxillary sinus tenderness and no frontal sinus tenderness. Left sinus exhibits no maxillary sinus tenderness and no frontal sinus tenderness.  Mouth/Throat: Uvula is midline, oropharynx is clear and moist and mucous membranes are normal.  Cardiovascular: Normal rate, regular rhythm, normal heart sounds and intact distal pulses.  Exam reveals no gallop and no friction rub.   No murmur heard. Pulmonary/Chest: Effort normal and breath sounds normal. No respiratory distress. He has no wheezes. He has no rales. He exhibits no tenderness.  Neurological: He is alert and oriented to person, place, and time.  Skin: Skin is warm and dry. No rash noted. He is  not diaphoretic. No erythema. No pallor.  Psychiatric: He has a normal mood and affect. His behavior is normal. Thought content normal.  Nursing note and vitals reviewed.     Assessment & Plan:  1. Upper respiratory tract infection, unspecified type - doxycycline (VIBRAMYCIN) 100 MG capsule; Take 1 capsule (100 mg total) by mouth 2 (two) times daily.  Dispense: 14 capsule; Refill: 0 - Can use Flonase - Follow up sooner if needed  Dorothyann Peng, NP

## 2016-11-14 DIAGNOSIS — M1712 Unilateral primary osteoarthritis, left knee: Secondary | ICD-10-CM | POA: Diagnosis not present

## 2016-11-14 DIAGNOSIS — M7061 Trochanteric bursitis, right hip: Secondary | ICD-10-CM | POA: Diagnosis not present

## 2016-11-26 DIAGNOSIS — M79671 Pain in right foot: Secondary | ICD-10-CM | POA: Diagnosis not present

## 2016-12-11 DIAGNOSIS — M545 Low back pain: Secondary | ICD-10-CM | POA: Diagnosis not present

## 2016-12-12 DIAGNOSIS — M25561 Pain in right knee: Secondary | ICD-10-CM | POA: Diagnosis not present

## 2016-12-12 DIAGNOSIS — M1711 Unilateral primary osteoarthritis, right knee: Secondary | ICD-10-CM | POA: Diagnosis not present

## 2016-12-19 DIAGNOSIS — M545 Low back pain: Secondary | ICD-10-CM | POA: Diagnosis not present

## 2016-12-20 ENCOUNTER — Other Ambulatory Visit: Payer: Self-pay | Admitting: Neurological Surgery

## 2016-12-20 DIAGNOSIS — M7130 Other bursal cyst, unspecified site: Secondary | ICD-10-CM | POA: Diagnosis not present

## 2016-12-20 DIAGNOSIS — M48062 Spinal stenosis, lumbar region with neurogenic claudication: Secondary | ICD-10-CM | POA: Diagnosis not present

## 2016-12-20 DIAGNOSIS — M545 Low back pain: Secondary | ICD-10-CM | POA: Diagnosis not present

## 2016-12-24 ENCOUNTER — Encounter (HOSPITAL_COMMUNITY): Payer: Self-pay | Admitting: *Deleted

## 2016-12-24 NOTE — Progress Notes (Signed)
Pt denies SOB and chest pain. Pt under the care of Dr. Acie Fredrickson, Cardiology. Pt denies having a cardiac cath. Pt stated that his last dose of Aspirin was Sunday. Pt made aware to stop taking vitamins, fish oil, CoQ10 and herbal medications. Do not take any NSAIDs ie: Ibuprofen, Advil, Naproxen (Aleve), Celebrex, Motrin, BC and Goody Powder. Pt denies having a chest x ray within the last year. Pt denies recent labs. Pt verbalized understanding of all pre-op instructions. Anesthesia asked to review pt history.

## 2016-12-25 ENCOUNTER — Encounter (HOSPITAL_COMMUNITY): Payer: Self-pay | Admitting: General Practice

## 2016-12-25 ENCOUNTER — Inpatient Hospital Stay (HOSPITAL_COMMUNITY): Payer: Medicare Other

## 2016-12-25 ENCOUNTER — Observation Stay (HOSPITAL_COMMUNITY)
Admission: RE | Admit: 2016-12-25 | Discharge: 2016-12-25 | Disposition: A | Discharge status: Home / Self Care | Payer: Medicare Other | Source: Ambulatory Visit | Attending: Neurological Surgery | Admitting: Neurological Surgery

## 2016-12-25 ENCOUNTER — Inpatient Hospital Stay (HOSPITAL_COMMUNITY): Payer: Medicare Other | Admitting: Emergency Medicine

## 2016-12-25 ENCOUNTER — Encounter (HOSPITAL_COMMUNITY)
Admission: RE | Disposition: A | Discharge status: Home / Self Care | Payer: Self-pay | Source: Ambulatory Visit | Attending: Neurological Surgery

## 2016-12-25 DIAGNOSIS — I10 Essential (primary) hypertension: Secondary | ICD-10-CM | POA: Insufficient documentation

## 2016-12-25 DIAGNOSIS — M7138 Other bursal cyst, other site: Secondary | ICD-10-CM | POA: Diagnosis not present

## 2016-12-25 DIAGNOSIS — Z7982 Long term (current) use of aspirin: Secondary | ICD-10-CM | POA: Diagnosis not present

## 2016-12-25 DIAGNOSIS — Z79899 Other long term (current) drug therapy: Secondary | ICD-10-CM | POA: Insufficient documentation

## 2016-12-25 DIAGNOSIS — I700 Atherosclerosis of aorta: Secondary | ICD-10-CM | POA: Diagnosis not present

## 2016-12-25 DIAGNOSIS — Z888 Allergy status to other drugs, medicaments and biological substances status: Secondary | ICD-10-CM | POA: Diagnosis not present

## 2016-12-25 DIAGNOSIS — M7130 Other bursal cyst, unspecified site: Secondary | ICD-10-CM

## 2016-12-25 DIAGNOSIS — Z9889 Other specified postprocedural states: Secondary | ICD-10-CM

## 2016-12-25 DIAGNOSIS — M1990 Unspecified osteoarthritis, unspecified site: Secondary | ICD-10-CM | POA: Insufficient documentation

## 2016-12-25 DIAGNOSIS — Z419 Encounter for procedure for purposes other than remedying health state, unspecified: Secondary | ICD-10-CM

## 2016-12-25 DIAGNOSIS — Z8249 Family history of ischemic heart disease and other diseases of the circulatory system: Secondary | ICD-10-CM | POA: Insufficient documentation

## 2016-12-25 DIAGNOSIS — E039 Hypothyroidism, unspecified: Secondary | ICD-10-CM | POA: Insufficient documentation

## 2016-12-25 DIAGNOSIS — E7800 Pure hypercholesterolemia, unspecified: Secondary | ICD-10-CM | POA: Insufficient documentation

## 2016-12-25 DIAGNOSIS — M48061 Spinal stenosis, lumbar region without neurogenic claudication: Secondary | ICD-10-CM | POA: Diagnosis not present

## 2016-12-25 DIAGNOSIS — M4326 Fusion of spine, lumbar region: Secondary | ICD-10-CM | POA: Diagnosis not present

## 2016-12-25 DIAGNOSIS — Z87891 Personal history of nicotine dependence: Secondary | ICD-10-CM | POA: Diagnosis not present

## 2016-12-25 HISTORY — DX: Other bursal cyst, unspecified site: M71.30

## 2016-12-25 HISTORY — DX: Pneumonia, unspecified organism: J18.9

## 2016-12-25 HISTORY — DX: Unspecified osteoarthritis, unspecified site: M19.90

## 2016-12-25 HISTORY — PX: LUMBAR LAMINECTOMY/DECOMPRESSION MICRODISCECTOMY: SHX5026

## 2016-12-25 LAB — CBC WITH DIFFERENTIAL/PLATELET
BASOS PCT: 0 %
Basophils Absolute: 0 10*3/uL (ref 0.0–0.1)
Eosinophils Absolute: 0.1 10*3/uL (ref 0.0–0.7)
Eosinophils Relative: 2 %
HEMATOCRIT: 46.9 % (ref 39.0–52.0)
HEMOGLOBIN: 16.6 g/dL (ref 13.0–17.0)
LYMPHS ABS: 1.7 10*3/uL (ref 0.7–4.0)
LYMPHS PCT: 22 %
MCH: 31.7 pg (ref 26.0–34.0)
MCHC: 35.4 g/dL (ref 30.0–36.0)
MCV: 89.7 fL (ref 78.0–100.0)
MONOS PCT: 10 %
Monocytes Absolute: 0.8 10*3/uL (ref 0.1–1.0)
NEUTROS ABS: 5 10*3/uL (ref 1.7–7.7)
NEUTROS PCT: 66 %
Platelets: 213 10*3/uL (ref 150–400)
RBC: 5.23 MIL/uL (ref 4.22–5.81)
RDW: 12.8 % (ref 11.5–15.5)
WBC: 7.6 10*3/uL (ref 4.0–10.5)

## 2016-12-25 LAB — BASIC METABOLIC PANEL
ANION GAP: 4 — AB (ref 5–15)
BUN: 15 mg/dL (ref 6–20)
CALCIUM: 8.9 mg/dL (ref 8.9–10.3)
CHLORIDE: 106 mmol/L (ref 101–111)
CO2: 29 mmol/L (ref 22–32)
Creatinine, Ser: 1.06 mg/dL (ref 0.61–1.24)
GFR calc non Af Amer: >60 mL/min (ref >60)
Glucose, Bld: 133 mg/dL — ABNORMAL HIGH (ref 65–99)
POTASSIUM: 3.9 mmol/L (ref 3.5–5.1)
Sodium: 139 mmol/L (ref 135–145)

## 2016-12-25 LAB — PROTIME-INR
INR: 0.98
Prothrombin Time: 12.9 seconds (ref 11.4–15.2)

## 2016-12-25 SURGERY — LUMBAR LAMINECTOMY/DECOMPRESSION MICRODISCECTOMY 2 LEVELS
Anesthesia: General | Site: Back

## 2016-12-25 MED ORDER — FENTANYL CITRATE (PF) 100 MCG/2ML IJ SOLN
25.0000 ug | INTRAMUSCULAR | Status: DC | PRN
  Administered 2016-12-25: 50 ug via INTRAVENOUS

## 2016-12-25 MED ORDER — OXYCODONE HCL 5 MG PO TABS
5.0000 mg | ORAL_TABLET | Freq: Once | ORAL | Status: AC | PRN
  Administered 2016-12-25: 5 mg via ORAL

## 2016-12-25 MED ORDER — ONDANSETRON HCL 4 MG/2ML IJ SOLN
4.0000 mg | Freq: Four times a day (QID) | INTRAMUSCULAR | Status: DC | PRN
Start: 2016-12-25 — End: 2016-12-25

## 2016-12-25 MED ORDER — FENTANYL CITRATE (PF) 100 MCG/2ML IJ SOLN
INTRAMUSCULAR | Status: AC
  Filled 2016-12-25: qty 2

## 2016-12-25 MED ORDER — CHLORHEXIDINE GLUCONATE CLOTH 2 % EX PADS: 6.0000 | MEDICATED_PAD | Freq: Once | CUTANEOUS | Status: DC

## 2016-12-25 MED ORDER — SUCCINYLCHOLINE CHLORIDE 200 MG/10ML IV SOSY
PREFILLED_SYRINGE | INTRAVENOUS | Status: AC
  Filled 2016-12-25: qty 10

## 2016-12-25 MED ORDER — MIDAZOLAM HCL 2 MG/2ML IJ SOLN
INTRAMUSCULAR | Status: AC
  Filled 2016-12-25: qty 2

## 2016-12-25 MED ORDER — ASPIRIN EC 81 MG PO TBEC
81.0000 mg | DELAYED_RELEASE_TABLET | Freq: Every day | ORAL | Status: DC
  Filled 2016-12-25: qty 1

## 2016-12-25 MED ORDER — HYDROCODONE-ACETAMINOPHEN 7.5-325 MG PO TABS: 1.0000 | ORAL_TABLET | Freq: Four times a day (QID) | ORAL | 0 refills | Status: DC

## 2016-12-25 MED ORDER — ONDANSETRON HCL 4 MG/2ML IJ SOLN
INTRAMUSCULAR | Status: DC | PRN
  Administered 2016-12-25: 4 mg via INTRAVENOUS

## 2016-12-25 MED ORDER — MIDAZOLAM HCL 2 MG/2ML IJ SOLN
INTRAMUSCULAR | Status: DC | PRN
  Administered 2016-12-25: 1 mg via INTRAVENOUS

## 2016-12-25 MED ORDER — CELECOXIB 200 MG PO CAPS: 200.0000 mg | ORAL_CAPSULE | Freq: Two times a day (BID) | ORAL | Status: DC | PRN

## 2016-12-25 MED ORDER — OXYCODONE HCL 5 MG/5ML PO SOLN: 5.0000 mg | Freq: Once | ORAL | Status: AC | PRN

## 2016-12-25 MED ORDER — ARTIFICIAL TEARS OPHTHALMIC OINT
TOPICAL_OINTMENT | OPHTHALMIC | Status: AC
  Filled 2016-12-25: qty 10.5

## 2016-12-25 MED ORDER — TRAMADOL HCL 50 MG PO TABS: 50.0000 mg | ORAL_TABLET | Freq: Four times a day (QID) | ORAL | Status: DC | PRN

## 2016-12-25 MED ORDER — PROPOFOL 10 MG/ML IV BOLUS
INTRAVENOUS | Status: AC
  Filled 2016-12-25: qty 40

## 2016-12-25 MED ORDER — EZETIMIBE-SIMVASTATIN 10-20 MG PO TABS
1.0000 | ORAL_TABLET | Freq: Every day | ORAL | Status: DC
  Filled 2016-12-25: qty 1

## 2016-12-25 MED ORDER — ONDANSETRON HCL 4 MG PO TABS: 4.0000 mg | ORAL_TABLET | Freq: Four times a day (QID) | ORAL | Status: DC | PRN

## 2016-12-25 MED ORDER — LIDOCAINE 2% (20 MG/ML) 5 ML SYRINGE
INTRAMUSCULAR | Status: AC
  Filled 2016-12-25: qty 5

## 2016-12-25 MED ORDER — LIDOCAINE 2% (20 MG/ML) 5 ML SYRINGE
INTRAMUSCULAR | Status: DC | PRN
  Administered 2016-12-25: 70 mg via INTRAVENOUS

## 2016-12-25 MED ORDER — METHOCARBAMOL 500 MG PO TABS
ORAL_TABLET | ORAL | Status: AC
  Filled 2016-12-25: qty 1

## 2016-12-25 MED ORDER — HYDROCODONE-ACETAMINOPHEN 7.5-325 MG PO TABS
1.0000 | ORAL_TABLET | Freq: Four times a day (QID) | ORAL | Status: DC
  Administered 2016-12-25: 1 via ORAL
  Filled 2016-12-25: qty 1

## 2016-12-25 MED ORDER — PHENYLEPHRINE HCL 10 MG/ML IJ SOLN
INTRAVENOUS | Status: DC | PRN
  Administered 2016-12-25: 15 ug/min via INTRAVENOUS

## 2016-12-25 MED ORDER — ACETAMINOPHEN 325 MG PO TABS: 325.0000 mg | ORAL_TABLET | ORAL | Status: DC | PRN

## 2016-12-25 MED ORDER — EPHEDRINE SULFATE-NACL 50-0.9 MG/10ML-% IV SOSY
PREFILLED_SYRINGE | INTRAVENOUS | Status: DC | PRN
  Administered 2016-12-25: 5 mg via INTRAVENOUS
  Administered 2016-12-25: 10 mg via INTRAVENOUS

## 2016-12-25 MED ORDER — FENTANYL CITRATE (PF) 250 MCG/5ML IJ SOLN
INTRAMUSCULAR | Status: AC
  Filled 2016-12-25: qty 5

## 2016-12-25 MED ORDER — ACETAMINOPHEN 650 MG RE SUPP: 650.0000 mg | RECTAL | Status: DC | PRN

## 2016-12-25 MED ORDER — ACETAMINOPHEN 325 MG PO TABS: 650.0000 mg | ORAL_TABLET | ORAL | Status: DC | PRN

## 2016-12-25 MED ORDER — SODIUM CHLORIDE 0.9% FLUSH: 3.0000 mL | Freq: Two times a day (BID) | INTRAVENOUS | Status: DC

## 2016-12-25 MED ORDER — BUPIVACAINE HCL (PF) 0.5 % IJ SOLN
INTRAMUSCULAR | Status: AC
  Filled 2016-12-25: qty 30

## 2016-12-25 MED ORDER — METHOCARBAMOL 1000 MG/10ML IJ SOLN
500.0000 mg | Freq: Four times a day (QID) | INTRAVENOUS | Status: DC | PRN
  Filled 2016-12-25: qty 5

## 2016-12-25 MED ORDER — ROCURONIUM BROMIDE 10 MG/ML (PF) SYRINGE
PREFILLED_SYRINGE | INTRAVENOUS | Status: AC
  Filled 2016-12-25: qty 5

## 2016-12-25 MED ORDER — SUGAMMADEX SODIUM 200 MG/2ML IV SOLN
INTRAVENOUS | Status: DC | PRN
  Administered 2016-12-25: 190 mg via INTRAVENOUS

## 2016-12-25 MED ORDER — SODIUM CHLORIDE 0.9 % IR SOLN
Status: DC | PRN
  Administered 2016-12-25: 09:00:00

## 2016-12-25 MED ORDER — THROMBIN 5000 UNITS EX SOLR
CUTANEOUS | Status: AC
  Filled 2016-12-25: qty 15000

## 2016-12-25 MED ORDER — ACETAMINOPHEN 160 MG/5ML PO SOLN: 325.0000 mg | ORAL | Status: DC | PRN

## 2016-12-25 MED ORDER — THROMBIN 5000 UNITS EX SOLR
CUTANEOUS | Status: DC | PRN
  Administered 2016-12-25: 10000 [IU] via TOPICAL

## 2016-12-25 MED ORDER — THROMBIN 5000 UNITS EX SOLR
OROMUCOSAL | Status: DC | PRN
  Administered 2016-12-25: 09:00:00 via TOPICAL

## 2016-12-25 MED ORDER — PROPOFOL 10 MG/ML IV BOLUS
INTRAVENOUS | Status: DC | PRN
  Administered 2016-12-25: 140 mg via INTRAVENOUS

## 2016-12-25 MED ORDER — SENNA 8.6 MG PO TABS
1.0000 | ORAL_TABLET | Freq: Two times a day (BID) | ORAL | Status: DC
  Administered 2016-12-25: 8.6 mg via ORAL
  Filled 2016-12-25: qty 1

## 2016-12-25 MED ORDER — CEFAZOLIN SODIUM-DEXTROSE 2-4 GM/100ML-% IV SOLN: 2.0000 g | Freq: Three times a day (TID) | INTRAVENOUS | Status: DC

## 2016-12-25 MED ORDER — LEVOTHYROXINE SODIUM 100 MCG PO TABS: 150.0000 ug | ORAL_TABLET | Freq: Every day | ORAL | Status: DC

## 2016-12-25 MED ORDER — SODIUM CHLORIDE 0.9 % IV SOLN: 250.0000 mL | INTRAVENOUS | Status: DC

## 2016-12-25 MED ORDER — OXYCODONE HCL 5 MG PO TABS
ORAL_TABLET | ORAL | Status: AC
  Filled 2016-12-25: qty 1

## 2016-12-25 MED ORDER — CEFAZOLIN SODIUM-DEXTROSE 2-4 GM/100ML-% IV SOLN
2.0000 g | INTRAVENOUS | Status: AC
  Administered 2016-12-25: 2 g via INTRAVENOUS
  Filled 2016-12-25: qty 100

## 2016-12-25 MED ORDER — SODIUM CHLORIDE 0.9% FLUSH: 3.0000 mL | INTRAVENOUS | Status: DC | PRN

## 2016-12-25 MED ORDER — HEMOSTATIC AGENTS (NO CHARGE) OPTIME
TOPICAL | Status: DC | PRN
  Administered 2016-12-25: 1 via TOPICAL

## 2016-12-25 MED ORDER — METHOCARBAMOL 500 MG PO TABS
500.0000 mg | ORAL_TABLET | Freq: Four times a day (QID) | ORAL | Status: DC | PRN
  Administered 2016-12-25: 500 mg via ORAL

## 2016-12-25 MED ORDER — PHENYLEPHRINE HCL 10 MG/ML IJ SOLN
INTRAMUSCULAR | Status: DC | PRN
  Administered 2016-12-25 (×2): 40 ug via INTRAVENOUS
  Administered 2016-12-25: 120 ug via INTRAVENOUS

## 2016-12-25 MED ORDER — MENTHOL 3 MG MT LOZG: 1.0000 | LOZENGE | OROMUCOSAL | Status: DC | PRN

## 2016-12-25 MED ORDER — FENTANYL CITRATE (PF) 100 MCG/2ML IJ SOLN
INTRAMUSCULAR | Status: DC | PRN
  Administered 2016-12-25: 100 ug via INTRAVENOUS
  Administered 2016-12-25: 150 ug via INTRAVENOUS

## 2016-12-25 MED ORDER — DEXAMETHASONE SODIUM PHOSPHATE 10 MG/ML IJ SOLN
10.0000 mg | INTRAMUSCULAR | Status: AC
  Administered 2016-12-25: 10 mg via INTRAVENOUS
  Filled 2016-12-25: qty 1

## 2016-12-25 MED ORDER — PHENOL 1.4 % MT LIQD: 1.0000 | OROMUCOSAL | Status: DC | PRN

## 2016-12-25 MED ORDER — 0.9 % SODIUM CHLORIDE (POUR BTL) OPTIME
TOPICAL | Status: DC | PRN
  Administered 2016-12-25: 1000 mL

## 2016-12-25 MED ORDER — MORPHINE SULFATE (PF) 4 MG/ML IV SOLN: 2.0000 mg | INTRAVENOUS | Status: DC | PRN

## 2016-12-25 MED ORDER — ROCURONIUM BROMIDE 100 MG/10ML IV SOLN
INTRAVENOUS | Status: DC | PRN
  Administered 2016-12-25: 10 mg via INTRAVENOUS
  Administered 2016-12-25: 60 mg via INTRAVENOUS

## 2016-12-25 MED ORDER — ATENOLOL 50 MG PO TABS: 50.0000 mg | ORAL_TABLET | Freq: Every day | ORAL | Status: DC

## 2016-12-25 MED ORDER — POTASSIUM CHLORIDE IN NACL 20-0.9 MEQ/L-% IV SOLN: INTRAVENOUS | Status: DC

## 2016-12-25 MED ORDER — BUPIVACAINE HCL 0.5 % IJ SOLN
INTRAMUSCULAR | Status: DC | PRN
  Administered 2016-12-25: 4 mL

## 2016-12-25 MED ORDER — LACTATED RINGERS IV SOLN
INTRAVENOUS | Status: DC | PRN
  Administered 2016-12-25 (×2): via INTRAVENOUS

## 2016-12-25 MED ORDER — SUGAMMADEX SODIUM 200 MG/2ML IV SOLN
INTRAVENOUS | Status: AC
  Filled 2016-12-25: qty 2

## 2016-12-25 SURGICAL SUPPLY — 50 items
ADH SKN CLS APL DERMABOND .7 (GAUZE/BANDAGES/DRESSINGS) ×2
APL SKNCLS STERI-STRIP NONHPOA (GAUZE/BANDAGES/DRESSINGS) ×1
BAG DECANTER FOR FLEXI CONT (MISCELLANEOUS) ×3 IMPLANT
BENZOIN TINCTURE PRP APPL 2/3 (GAUZE/BANDAGES/DRESSINGS) ×3 IMPLANT
BUR MATCHSTICK NEURO 3.0 LAGG (BURR) ×3 IMPLANT
CANISTER SUCT 3000ML PPV (MISCELLANEOUS) ×3 IMPLANT
CARTRIDGE OIL MAESTRO DRILL (MISCELLANEOUS) ×1 IMPLANT
CLOSURE WOUND 1/2 X4 (GAUZE/BANDAGES/DRESSINGS) ×2
DERMABOND ADVANCED (GAUZE/BANDAGES/DRESSINGS) ×4
DERMABOND ADVANCED .7 DNX12 (GAUZE/BANDAGES/DRESSINGS) IMPLANT
DIFFUSER DRILL AIR PNEUMATIC (MISCELLANEOUS) ×3 IMPLANT
DRAPE LAPAROTOMY 100X72X124 (DRAPES) ×3 IMPLANT
DRAPE MICROSCOPE LEICA (MISCELLANEOUS) ×3 IMPLANT
DRAPE POUCH INSTRU U-SHP 10X18 (DRAPES) ×3 IMPLANT
DRAPE SURG 17X23 STRL (DRAPES) ×3 IMPLANT
DRSG OPSITE POSTOP 4X6 (GAUZE/BANDAGES/DRESSINGS) ×2 IMPLANT
DURAPREP 26ML APPLICATOR (WOUND CARE) ×3 IMPLANT
ELECT REM PT RETURN 9FT ADLT (ELECTROSURGICAL) ×3
ELECTRODE REM PT RTRN 9FT ADLT (ELECTROSURGICAL) ×1 IMPLANT
GAUZE SPONGE 4X4 16PLY XRAY LF (GAUZE/BANDAGES/DRESSINGS) IMPLANT
GLOVE BIO SURGEON STRL SZ7 (GLOVE) ×4 IMPLANT
GLOVE BIO SURGEON STRL SZ8 (GLOVE) ×3 IMPLANT
GLOVE BIOGEL PI IND STRL 7.0 (GLOVE) IMPLANT
GLOVE BIOGEL PI INDICATOR 7.0 (GLOVE)
GOWN STRL REUS W/ TWL LRG LVL3 (GOWN DISPOSABLE) IMPLANT
GOWN STRL REUS W/ TWL XL LVL3 (GOWN DISPOSABLE) ×1 IMPLANT
GOWN STRL REUS W/TWL 2XL LVL3 (GOWN DISPOSABLE) IMPLANT
GOWN STRL REUS W/TWL LRG LVL3 (GOWN DISPOSABLE)
GOWN STRL REUS W/TWL XL LVL3 (GOWN DISPOSABLE) ×3
HEMOSTAT POWDER KIT SURGIFOAM (HEMOSTASIS) ×2 IMPLANT
KIT BASIN OR (CUSTOM PROCEDURE TRAY) ×3 IMPLANT
KIT ROOM TURNOVER OR (KITS) ×3 IMPLANT
NDL HYPO 25X1 1.5 SAFETY (NEEDLE) ×1 IMPLANT
NDL SPNL 20GX3.5 QUINCKE YW (NEEDLE) IMPLANT
NEEDLE HYPO 25X1 1.5 SAFETY (NEEDLE) ×3 IMPLANT
NEEDLE SPNL 20GX3.5 QUINCKE YW (NEEDLE) IMPLANT
NS IRRIG 1000ML POUR BTL (IV SOLUTION) ×3 IMPLANT
OIL CARTRIDGE MAESTRO DRILL (MISCELLANEOUS) ×3
PACK LAMINECTOMY NEURO (CUSTOM PROCEDURE TRAY) ×3 IMPLANT
PAD ARMBOARD 7.5X6 YLW CONV (MISCELLANEOUS) ×9 IMPLANT
RUBBERBAND STERILE (MISCELLANEOUS) ×6 IMPLANT
SPONGE SURGIFOAM ABS GEL SZ50 (HEMOSTASIS) ×3 IMPLANT
STRIP CLOSURE SKIN 1/2X4 (GAUZE/BANDAGES/DRESSINGS) ×3 IMPLANT
SUT VIC AB 0 CT1 18XCR BRD8 (SUTURE) ×1 IMPLANT
SUT VIC AB 0 CT1 8-18 (SUTURE) ×3
SUT VIC AB 2-0 CP2 18 (SUTURE) ×3 IMPLANT
SUT VIC AB 3-0 SH 8-18 (SUTURE) ×5 IMPLANT
TOWEL GREEN STERILE (TOWEL DISPOSABLE) ×3 IMPLANT
TOWEL GREEN STERILE FF (TOWEL DISPOSABLE) ×3 IMPLANT
WATER STERILE IRR 1000ML POUR (IV SOLUTION) ×3 IMPLANT

## 2016-12-25 NOTE — Transfer of Care (Signed)
Immediate Anesthesia Transfer of Care Note  Patient: Carlos Juarez  Procedure(s) Performed: Procedure(s): Laminectomy and Foraminotomy - Lumbar two-Lumbar three - Lumbar three-Lumbar four, resection of synovial cyst (N/A)  Patient Location: PACU  Anesthesia Type:General  Level of Consciousness: awake, alert  and oriented  Airway & Oxygen Therapy: Patient Spontanous Breathing and Patient connected to nasal cannula oxygen  Post-op Assessment: Report given to RN, Post -op Vital signs reviewed and stable and Patient moving all extremities  Post vital signs: Reviewed and stable  Last Vitals:  Vitals:   12/25/16 0646 12/25/16 1019  BP: (!) 151/94   Pulse: (!) 56   Resp: 18   Temp: 36.9 C 36.7 C  SpO2: 98%     Last Pain:  Vitals:   12/25/16 1019  TempSrc:   PainSc: 4       Patients Stated Pain Goal: 3 (44/01/02 7253)  Complications: No apparent anesthesia complications

## 2016-12-25 NOTE — Anesthesia Preprocedure Evaluation (Addendum)
Anesthesia Evaluation  Patient identified by MRN, date of birth, ID band Patient awake    Reviewed: Allergy & Precautions, NPO status , Patient's Chart, lab work & pertinent test results, reviewed documented beta blocker date and time   History of Anesthesia Complications Negative for: history of anesthetic complications  Airway Mallampati: II  TM Distance: >3 FB Neck ROM: Full    Dental no notable dental hx. (+) Implants, Dental Advisory Given, Teeth Intact   Pulmonary neg shortness of breath, neg sleep apnea, neg COPD, neg recent URI, former smoker,    breath sounds clear to auscultation       Cardiovascular hypertension, Pt. on medications and Pt. on home beta blockers (-) angina(-) Past MI and (-) CHF (-) dysrhythmias  Rhythm:Regular     Neuro/Psych  Neuromuscular disease negative psych ROS   GI/Hepatic negative GI ROS, Neg liver ROS,   Endo/Other  Hypothyroidism   Renal/GU negative Renal ROS     Musculoskeletal  (+) Arthritis ,   Abdominal   Peds  Hematology negative hematology ROS (+)   Anesthesia Other Findings   Reproductive/Obstetrics                           Anesthesia Physical Anesthesia Plan  ASA: II  Anesthesia Plan: General   Post-op Pain Management:    Induction: Intravenous  PONV Risk Score and Plan: 2 and Ondansetron and Dexamethasone  Airway Management Planned: Oral ETT  Additional Equipment: None  Intra-op Plan:   Post-operative Plan: Extubation in OR  Informed Consent: I have reviewed the patients History and Physical, chart, labs and discussed the procedure including the risks, benefits and alternatives for the proposed anesthesia with the patient or authorized representative who has indicated his/her understanding and acceptance.   Dental advisory given  Plan Discussed with: CRNA and Surgeon  Anesthesia Plan Comments:         Anesthesia Quick  Evaluation

## 2016-12-25 NOTE — H&P (Signed)
Subjective: Patient is a 76 y.o. male admitted for spinal stenosis / synovial cyst. Onset of symptoms was several months ago, gradually worsening since that time.  The pain is rated severe, and is located at the across the lower back and radiates to RLE. The pain is described as aching and occurs all day. The symptoms have been progressive. Symptoms are exacerbated by exercise. MRI or CT showed stenosis/ synovial cyst   Past Medical History:  Diagnosis Date  . Arthritis   . HTN (hypertension)   . Hypercholesteremia   . Hypothyroidism   . Pneumonia   . PVC's (premature ventricular contractions)   . Synovial cyst     Past Surgical History:  Procedure Laterality Date  . HERNIA REPAIR    . MENISCUS REPAIR Left   . TONSILLECTOMY      Prior to Admission medications   Medication Sig Start Date End Date Taking? Authorizing Provider  aspirin 81 MG tablet Take 81 mg by mouth at bedtime.    Yes [provider]  atenolol (TENORMIN) 50 MG tablet Take 50 mg by mouth daily.     Yes [provider]  celecoxib (CELEBREX) 200 MG capsule Take 200 mg by mouth 2 (two) times daily as needed for mild pain.  06/30/15  Yes [provider]  ezetimibe-simvastatin (VYTORIN) 10-20 MG tablet Take 1 tablet by mouth daily. Patient taking differently: Take 1 tablet by mouth at bedtime.  05/27/16  Yes Nahser, Wonda Cheng, MD  levothyroxine (SYNTHROID, LEVOTHROID) 150 MCG tablet Take 150 mcg by mouth daily before breakfast.    Yes [provider]  Omega-3 Fatty Acids (FISH OIL PO) Take 5 mLs by mouth daily.   Yes [provider]  sodium chloride (OCEAN) 0.65 % SOLN nasal spray Place 1 spray into both nostrils every 4 (four) hours as needed for congestion.   Yes [provider]  traMADol (ULTRAM) 50 MG tablet Take 50 mg by mouth every 6 (six) hours as needed for pain. 12/12/16  Yes [provider]  Coenzyme Q10 (COQ10 PO) Take 1 mL by mouth daily.     [provider]   Allergies  Allergen Reactions  . Epinephrine Other (See Comments)    Light headedness    Social History  Substance Use Topics  . Smoking status: Former Smoker    Packs/day: 1.00    Years: 15.00    Quit date: 07/15/1974  . Smokeless tobacco: Never Used     Comment: smoked heavy 15 years and then quit  . Alcohol use 0.0 oz/week     Comment: Wine with meals at times    Family History  Problem Relation Age of Onset  . Hypertension Mother   . Heart disease Father   . Heart attack Father 53     Review of Systems  Positive ROS: neg  All other systems have been reviewed and were otherwise negative with the exception of those mentioned in the HPI and as above.  Objective: Vital signs in last 24 hours: Temp:  [98.4 F (36.9 C)] 98.4 F (36.9 C) (09/12 0646) Pulse Rate:  [56] 56 (09/12 0646) Resp:  [18] 18 (09/12 0646) BP: (151)/(94) 151/94 (09/12 0646) SpO2:  [98 %] 98 % (09/12 0646) Weight:  [95.7 kg (211 lb)] 95.7 kg (211 lb) (09/12 0646)  General Appearance: Alert, cooperative, no distress, appears stated age Head: Normocephalic, without obvious abnormality, atraumatic Eyes: PERRL, conjunctiva/corneas clear, EOM's intact    Neck: Supple, symmetrical, trachea midline  Back: Symmetric, no curvature, ROM normal, no CVA tenderness Lungs:  respirations unlabored Heart: Regular rate and rhythm Abdomen: Soft, non-tender Extremities: Extremities normal, atraumatic, no cyanosis or edema Pulses: 2+ and symmetric all extremities Skin: Skin color, texture, turgor normal, no rashes or lesions  NEUROLOGIC:   Mental status: Alert and oriented x4,  no aphasia, good attention span, fund of knowledge, and memory Motor Exam - grossly normal Sensory Exam - grossly normal Reflexes: 1+ Coordination - grossly normal Gait - grossly normal Balance - grossly normal Cranial Nerves: I: smell Not tested  II: visual acuity  OS: nl    OD: nl  II: visual fields Full to  confrontation  II: pupils Equal, round, reactive to light  III,VII: ptosis None  III,IV,VI: extraocular muscles  Full ROM  V: mastication Normal  V: facial light touch sensation  Normal  V,VII: corneal reflex  Present  VII: facial muscle function - upper  Normal  VII: facial muscle function - lower Normal  VIII: hearing Not tested  IX: soft palate elevation  Normal  IX,X: gag reflex Present  XI: trapezius strength  5/5  XI: sternocleidomastoid strength 5/5  XI: neck flexion strength  5/5  XII: tongue strength  Normal    Data Review Lab Results  Component Value Date   WBC 7.6 12/25/2016   HGB 16.6 12/25/2016   HCT 46.9 12/25/2016   MCV 89.7 12/25/2016   PLT 213 12/25/2016   Lab Results  Component Value Date   NA 139 12/25/2016   K 3.9 12/25/2016   CL 106 12/25/2016   CO2 29 12/25/2016   BUN 15 12/25/2016   CREATININE 1.06 12/25/2016   GLUCOSE 133 (H) 12/25/2016   Lab Results  Component Value Date   INR 0.98 12/25/2016    Assessment/Plan: Patient admitted for LL L2-3 L3-4. Patient has failed a reasonable attempt at conservative therapy.  I explained the condition and procedure to the patient and answered any questions.  Patient wishes to proceed with procedure as planned. Understands risks/ benefits and typical outcomes of procedure.   Oletha Tolson S 12/25/2016 8:27 AM

## 2016-12-25 NOTE — Op Note (Signed)
12/25/2016  10:17 AM  PATIENT:  Carlos Juarez  76 y.o. 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 Imaging showed spinal stenosis L2-3 and L3-4 with a right-sided synovial cyst at L3-4. The patient tried conservative measures without relief. Pain was debilitating. Recommended right L2-3 and L3-4 decompressive hemilaminectomy with resection of synovial cyst. Patient understood the risks, benefits, and alternatives and potential outcomes and wished to proceed.  PROCEDURE DETAILS: The patient was taken to the operating room and after induction of adequate generalized endotracheal anesthesia, the patient was rolled into the prone position on the Wilson frame and all pressure points were padded. The lumbar region was cleaned and then prepped with DuraPrep and draped in the usual sterile fashion. 5 cc of local anesthesia was injected and then a dorsal midline incision was made and carried down to the lumbo sacral fascia. The fascia was opened and the paraspinous musculature was taken down in a subperiosteal fashion to expose L2-3 and L3-4 on the right. Intraoperative x-ray confirmed my level, and then I used a combination of the high-speed drill and the Kerrison punches to perform a hemilaminectomy, medial facetectomy, and foraminotomy at L2-3 and L3-4 on the right. The underlying yellow ligament  was opened and removed in a piecemeal fashion to expose the underlying dura and exiting nerve root. I undercut the lateral recess and dissected down until I was medial to and distal to the pedicle. There was a synovial cyst at the disc space level at L3-4 on the right. This was resected by undercutting the lateral recess. It was not overtly adherent to the dura. The nerve root was well decompressed.  I then drilled up under the spinous process at L2-3 and L3-4 performed with sublaminar decompression to decompress the central canal and the left lateral recess somewhat. He did not have left-sided symptoms and therefore did not feel I needed to be overly aggressive with this lateral recess. I then palpated with a coronary dilator along the nerve root and into the foramen to assure adequate decompression. I felt no more compression of the nerve root. I irrigated with saline solution containing bacitracin. Achieved hemostasis with bipolar cautery, lined the dura with Gelfoam, and then closed the fascia with 0 Vicryl. I closed the subcutaneous tissues with 2-0 Vicryl and the subcuticular tissues with 3-0 Vicryl. The skin was then closed with benzoin and Steri-Strips. The drapes were removed, a sterile dressing was applied. The patient was awakened from general anesthesia and transferred to the recovery room in stable condition. At the end of the procedure all sponge, needle and instrument counts were correct.    PLAN OF CARE: Admit for overnight observation  PATIENT DISPOSITION:  PACU - hemodynamically stable.   Delay start of Pharmacological VTE agent (>24hrs) due to surgical blood loss or risk of bleeding:  yes

## 2016-12-25 NOTE — Anesthesia Procedure Notes (Signed)
Procedure Name: Intubation Date/Time: 12/25/2016 8:47 AM Performed by: Trixie Deis A Pre-anesthesia Checklist: Patient identified, Emergency Drugs available, Suction available and Patient being monitored Patient Re-evaluated:Patient Re-evaluated prior to induction Oxygen Delivery Method: Circle System Utilized Preoxygenation: Pre-oxygenation with 100% oxygen Induction Type: IV induction Ventilation: Mask ventilation without difficulty Laryngoscope Size: Mac and 4 Grade View: Grade I Tube type: Oral Tube size: 7.5 mm Number of attempts: 1 Airway Equipment and Method: Stylet and Oral airway Placement Confirmation: ETT inserted through vocal cords under direct vision,  positive ETCO2 and breath sounds checked- equal and bilateral Secured at: 23 cm Tube secured with: Tape Dental Injury: Teeth and Oropharynx as per pre-operative assessment

## 2016-12-25 NOTE — Progress Notes (Signed)
Pt doing well. Pt and wife given D/C instructions with Rx's, verbal understanding was provided. Pt's incision is covered with Honeycomb dressing and has no sign of infection. Pt's IV was removed prior to D/C. Pt D/C'd home via wheelchair @ 1620 per MD order. Pt is stable @ D/C and has no other needs at this time. Holli Humbles, RN

## 2016-12-25 NOTE — Discharge Instructions (Signed)

## 2016-12-25 NOTE — Discharge Summary (Signed)
Physician Discharge Summary  Patient ID: Carlos Juarez MRN: [id number] DOB/AGE: Dec 26, 1940 76 y.o.  Admit date: 12/25/2016 Discharge date: 12/25/2016  Admission Diagnoses: lumbar stenosis    Discharge Diagnoses: same   Discharged Condition: good  Hospital Course: The patient was admitted on 12/25/2016 and taken to the operating room where the patient underwent LL. The patient tolerated the procedure well and was taken to the recovery room and then to the floor in stable condition. The hospital course was routine. There were no complications. The wound remained clean dry and intact. Pt had appropriate back soreness. No complaints of leg pain or new N/T/W. The patient remained afebrile with stable vital signs, and tolerated a regular diet. The patient continued to increase activities, and pain was well controlled with oral pain medications.   Consults: None  Significant Diagnostic Studies:  Results for orders placed or performed during the hospital encounter of 40/98/11  Basic metabolic panel  Result Value Ref Range   Sodium 139 135 - 145 mmol/L   Potassium 3.9 3.5 - 5.1 mmol/L   Chloride 106 101 - 111 mmol/L   CO2 29 22 - 32 mmol/L   Glucose, Bld 133 (H) 65 - 99 mg/dL   BUN 15 6 - 20 mg/dL   Creatinine, Ser 1.06 0.61 - 1.24 mg/dL   Calcium 8.9 8.9 - 10.3 mg/dL   GFR calc non Af Amer >60 >60 mL/min   GFR calc Af Amer >60 >60 mL/min   Anion gap 4 (L) 5 - 15  CBC WITH DIFFERENTIAL  Result Value Ref Range   WBC 7.6 4.0 - 10.5 K/uL   RBC 5.23 4.22 - 5.81 MIL/uL   Hemoglobin 16.6 13.0 - 17.0 g/dL   HCT 46.9 39.0 - 52.0 %   MCV 89.7 78.0 - 100.0 fL   MCH 31.7 26.0 - 34.0 pg   MCHC 35.4 30.0 - 36.0 g/dL   RDW 12.8 11.5 - 15.5 %   Platelets 213 150 - 400 K/uL   Neutrophils Relative % 66 %   Neutro Abs 5.0 1.7 - 7.7 K/uL   Lymphocytes Relative 22 %   Lymphs Abs 1.7 0.7 - 4.0 K/uL   Monocytes Relative 10 %   Monocytes Absolute 0.8 0.1 - 1.0 K/uL   Eosinophils Relative 2 %    Eosinophils Absolute 0.1 0.0 - 0.7 K/uL   Basophils Relative 0 %   Basophils Absolute 0.0 0.0 - 0.1 K/uL  Protime-INR  Result Value Ref Range   Prothrombin Time 12.9 11.4 - 15.2 seconds   INR 0.98     Chest 2 View  Result Date: 12/25/2016 CLINICAL DATA:  Preoperative exam prior to lumbar spine surgery. History of hypertension, former smoker. EXAM: CHEST  2 VIEW COMPARISON:  Chest x-ray of April 03, 2011 FINDINGS: The lungs are well-expanded and clear. The heart and pulmonary vascularity are normal. The mediastinum is normal in width. There is calcification in the wall of the aortic arch. There is no pleural effusion. The bony thorax exhibits no acute abnormality. IMPRESSION: There is no acute cardiopulmonary abnormality. Thoracic aortic atherosclerosis. Electronically Signed   By: Demaurion Dicioccio  [place] M.D.   On: 12/25/2016 07:20   Dg Lumbar Spine 2-3 Views  Result Date: 12/25/2016 CLINICAL DATA:  Laminectomy and foraminotomy at L2-3 and L3-4. Resection of synovial cyst EXAM: LUMBAR SPINE - 2-3 VIEW COMPARISON:  Lumbar spine radiographs 12/20/2016. FINDINGS: Image number 1 demonstrates a needle at the L3 spinous process. Image number 2 demonstrates a surgical probe  at the L3-4 level. IMPRESSION: Intraoperative localization of the L3 spinous process. Intraoperative localization of L3-4. Electronically Signed   By: San Morelle M.D.   On: 12/25/2016 09:46    Antibiotics:  Anti-infectives    Start     Dose/Rate Route Frequency Ordered Stop   12/25/16 1600  ceFAZolin (ANCEF) IVPB 2g/100 mL premix     2 g 200 mL/hr over 30 Minutes Intravenous Every 8 hours 12/25/16 1120 12/26/16 0759   12/25/16 0927  bacitracin 50,000 Units in sodium chloride irrigation 0.9 % 500 mL irrigation  Status:  Discontinued       As needed 12/25/16 0927 12/25/16 1016   12/25/16 0636  ceFAZolin (ANCEF) IVPB 2g/100 mL premix     2 g 200 mL/hr over 30 Minutes Intravenous On call to O.R. 12/25/16 0636 12/25/16 0905       Discharge Exam: Blood pressure 138/90, pulse 63, temperature 97.8 F (36.6 C), resp. rate 18, height 5\' 11"  (1.803 m), weight 95.7 kg (211 lb), SpO2 97 %. Neurologic: Grossly normal Dressing dry  Discharge Medications:   Allergies as of 12/25/2016      Reactions   Epinephrine Other (See Comments)   Light headedness      Medication List    TAKE these medications   aspirin 81 MG tablet Take 81 mg by mouth at bedtime.   atenolol 50 MG tablet Commonly known as:  TENORMIN Take 50 mg by mouth daily.   celecoxib 200 MG capsule Commonly known as:  CELEBREX Take 200 mg by mouth 2 (two) times daily as needed for mild pain.   COQ10 PO Take 1 mL by mouth daily.   ezetimibe-simvastatin 10-20 MG tablet Commonly known as:  VYTORIN Take 1 tablet by mouth daily. What changed:  when to take this   FISH OIL PO Take 5 mLs by mouth daily.   HYDROcodone-acetaminophen 7.5-325 MG tablet Commonly known as:  NORCO Take 1 tablet by mouth every 6 (six) hours.   levothyroxine 150 MCG tablet Commonly known as:  SYNTHROID, LEVOTHROID Take 150 mcg by mouth daily before breakfast.   sodium chloride 0.65 % Soln nasal spray Commonly known as:  OCEAN Place 1 spray into both nostrils every 4 (four) hours as needed for congestion.   traMADol 50 MG tablet Commonly known as:  ULTRAM Take 50 mg by mouth every 6 (six) hours as needed for pain.            Discharge Care Instructions        Start     Ordered   12/25/16 0000  HYDROcodone-acetaminophen (NORCO) 7.5-325 MG tablet  Every 6 hours     12/25/16 1521   12/25/16 0000  Increase activity slowly     12/25/16 1521   12/25/16 0000  Diet - low sodium heart healthy     12/25/16 1521   12/25/16 0000  Call MD for:  temperature >100.4     12/25/16 1521   12/25/16 0000  Call MD for:  persistant nausea and vomiting     12/25/16 1521   12/25/16 0000  Call MD for:  severe uncontrolled pain     12/25/16 1521   12/25/16 0000  Call MD for:   redness, tenderness, or signs of infection (pain, swelling, redness, odor or green/yellow discharge around incision site)     12/25/16 1521   12/25/16 0000  Call MD for:  difficulty breathing, headache or visual disturbances     12/25/16 1521   12/25/16  0000   Remove dressing in 72 hours     12/25/16 1521      Disposition: home   Final Dx: LL for stenosis  Discharge Instructions     Remove dressing in 72 hours    Complete by:  As directed    Call MD for:  difficulty breathing, headache or visual disturbances    Complete by:  As directed    Call MD for:  persistant nausea and vomiting    Complete by:  As directed    Call MD for:  redness, tenderness, or signs of infection (pain, swelling, redness, odor or green/yellow discharge around incision site)    Complete by:  As directed    Call MD for:  severe uncontrolled pain    Complete by:  As directed    Call MD for:  temperature >100.4    Complete by:  As directed    Diet - low sodium heart healthy    Complete by:  As directed    Increase activity slowly    Complete by:  As directed       Follow-up Information    Eustace Moore, MD .   Specialty:  Neurosurgery Contact information: [address] [place] [address] [phone]            Signed: Eustace Moore 12/25/2016, 3:21 PM

## 2016-12-26 ENCOUNTER — Encounter (HOSPITAL_COMMUNITY): Payer: Self-pay | Admitting: Neurological Surgery

## 2016-12-27 NOTE — Anesthesia Postprocedure Evaluation (Signed)
Anesthesia Post Note  Patient: Carlos Juarez  Procedure(s) Performed: Procedure(s) (LRB): Laminectomy and Foraminotomy - Lumbar two-Lumbar three - Lumbar three-Lumbar four, resection of synovial cyst (N/A)     Patient location during evaluation: PACU Anesthesia Type: General Level of consciousness: awake and alert Pain management: pain level controlled Vital Signs Assessment: post-procedure vital signs reviewed and stable Respiratory status: spontaneous breathing, nonlabored ventilation, respiratory function stable and patient connected to nasal cannula oxygen Cardiovascular status: blood pressure returned to baseline and stable Postop Assessment: no apparent nausea or vomiting Anesthetic complications: no    Last Vitals:  Vitals:   12/25/16 1100 12/25/16 1120  BP:  138/90  Pulse: 72 63  Resp: 18 18  Temp: (!) 36.3 C 36.6 C  SpO2: 95% 97%    Last Pain:  Vitals:   12/25/16 1245  TempSrc:   PainSc: 2                  Paulena Servais

## 2017-01-03 ENCOUNTER — Encounter: Payer: Self-pay | Admitting: Adult Health

## 2017-01-08 ENCOUNTER — Other Ambulatory Visit: Payer: Medicare Other | Admitting: *Deleted

## 2017-01-08 DIAGNOSIS — I10 Essential (primary) hypertension: Secondary | ICD-10-CM

## 2017-01-08 DIAGNOSIS — E782 Mixed hyperlipidemia: Secondary | ICD-10-CM

## 2017-01-08 LAB — LIPID PANEL
CHOLESTEROL TOTAL: 156 mg/dL (ref 100–199)
Chol/HDL Ratio: 2.9 ratio (ref 0.0–5.0)
HDL: 53 mg/dL (ref >39)
LDL Calculated: 83 mg/dL (ref 0–99)
Triglycerides: 101 mg/dL (ref 0–149)
VLDL CHOLESTEROL CAL: 20 mg/dL (ref 5–40)

## 2017-01-08 LAB — COMPREHENSIVE METABOLIC PANEL
ALBUMIN: 3.4 g/dL — AB (ref 3.5–4.8)
ALK PHOS: 68 IU/L (ref 39–117)
ALT: 22 IU/L (ref 0–44)
AST: 22 IU/L (ref 0–40)
Albumin/Globulin Ratio: 1.4 (ref 1.2–2.2)
BUN/Creatinine Ratio: 16 (ref 10–24)
BUN: 13 mg/dL (ref 8–27)
Bilirubin Total: 0.8 mg/dL (ref 0.0–1.2)
CALCIUM: 9 mg/dL (ref 8.6–10.2)
CO2: 23 mmol/L (ref 20–29)
CREATININE: 0.8 mg/dL (ref 0.76–1.27)
Chloride: 100 mmol/L (ref 96–106)
GFR calc Af Amer: 101 mL/min/{1.73_m2} (ref >59)
GFR calc non Af Amer: 87 mL/min/{1.73_m2} (ref >59)
GLUCOSE: 111 mg/dL — AB (ref 65–99)
Globulin, Total: 2.4 g/dL (ref 1.5–4.5)
Potassium: 4 mmol/L (ref 3.5–5.2)
Sodium: 140 mmol/L (ref 134–144)
Total Protein: 5.8 g/dL — ABNORMAL LOW (ref 6.0–8.5)

## 2017-01-14 ENCOUNTER — Encounter: Payer: Self-pay | Admitting: Cardiovascular Disease

## 2017-01-14 ENCOUNTER — Ambulatory Visit (INDEPENDENT_AMBULATORY_CARE_PROVIDER_SITE_OTHER): Payer: Medicare Other | Admitting: Cardiovascular Disease

## 2017-01-14 VITALS — BP 156/98 | HR 60 | Ht 71.0 in | Wt 210.8 lb

## 2017-01-14 DIAGNOSIS — I10 Essential (primary) hypertension: Secondary | ICD-10-CM | POA: Diagnosis not present

## 2017-01-14 DIAGNOSIS — E785 Hyperlipidemia, unspecified: Secondary | ICD-10-CM | POA: Diagnosis not present

## 2017-01-14 DIAGNOSIS — E782 Mixed hyperlipidemia: Secondary | ICD-10-CM

## 2017-01-14 NOTE — Progress Notes (Signed)
CARDIOLOGY OFFICE NOTE  Date:  01/14/2017    Carlos Juarez Date of Birth: [birth year] Medical Record #[id number]  PCP:  Dorothyann Peng, NP  Cardiologist:  Former patient of Dr. Sherryl Barters  , now [place]   Chief Complaint  Patient presents with  . Hyperlipidemia   Problem list 1. Essential hypertension 2. Hyperlipidemia 3. Hypothyroidism   Notes from [facility].  Carlos Juarez is a 76 y.o. male who presents today for a 4 month check. Former patient of Dr. Mare Ferrari.   He has a history of essential hypertension and a history of hypercholesterolemia. He does not have any history of ischemic heart disease. He had a normal treadmill Cardiolite stress test on 07/24/05 which showed no ischemia and his ejection fraction was 57% with no wall motion abnormalities. He also has a history of hypothyroid disease.   Last seen back in November - cardiac status was felt to be stable. Had had a fall.   Comes back today. Here alone. Remains on NSAID. BP high here today but says he has good control at home. Asking for his Xanax refill - would defer to PCP. He had his labs done earlier this month - those are reviewed. Blood sugar creeping up. He says he is walking most days but knows he needs to lose weight. No chest pain. Breathing is good.   Sept. 21, 2017:  Carlos Juarez is seen today for the first time.  Transfer from [place] .  BP is normal at home.   Is elevated here.  No CP or dyspnea  Works out with a Clinical research associate.  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   July 03, 2016   Doing well from a cardiac standpoint. Had carpal tunnel surgery on his right hand several weeks ago. Seems to be healing up nicely. His blood pressure and heart rate are well controlled.  He's back working out with his trainer on a regular basis. His blood pressure at home is very well-controlled. His blood pressure here today in the office is slightly  elevated. Weight and chol levels have increased    Jan 14 2017: Carlos Juarez is seen today for follow up visit . BP is elevated today here in the office.   BP readings at home look good.  Avoids salt Has started back exercise.   Had back surgery several months ,  Feeling better.  No further hip pain .   Past Medical History:  Diagnosis Date  . Arthritis   . HTN (hypertension)   . Hypercholesteremia   . Hypothyroidism   . Pneumonia   . PVC's (premature ventricular contractions)   . Synovial cyst     Past Surgical History:  Procedure Laterality Date  . HERNIA REPAIR    . LUMBAR LAMINECTOMY/DECOMPRESSION MICRODISCECTOMY N/A 12/25/2016   Procedure: Laminectomy and Foraminotomy - Lumbar two-Lumbar three - Lumbar three-Lumbar four, resection of synovial cyst;  Surgeon: Eustace Moore, MD;  Location: [facility];  Service: Neurosurgery;  Laterality: N/A;  . MENISCUS REPAIR Left   . TONSILLECTOMY       Medications: Current Outpatient Prescriptions  Medication Sig Dispense Refill  . aspirin 81 MG tablet Take 81 mg by mouth. Pt not taking regularly    . atenolol (TENORMIN) 50 MG tablet Take 50 mg by mouth daily.      . celecoxib (CELEBREX) 200 MG capsule Take 200 mg by mouth 2 (two) times daily as needed for  mild pain.     Marland Kitchen ezetimibe-simvastatin (VYTORIN) 10-20 MG tablet Take 1 tablet by mouth daily. 90 tablet 2  . levothyroxine (SYNTHROID, LEVOTHROID) 150 MCG tablet Take 150 mcg by mouth daily before breakfast.     . Omega-3 Fatty Acids (FISH OIL PO) Take 5 mLs by mouth every other day.     . sodium chloride (OCEAN) 0.65 % SOLN nasal spray Place 1 spray into both nostrils every 4 (four) hours as needed for congestion.    . traMADol (ULTRAM) 50 MG tablet Take 50 mg by mouth every 6 (six) hours as needed for pain.     No current facility-administered medications for this visit.     Allergies: Allergies  Allergen Reactions  . Epinephrine Other (See Comments)    Light headedness     Social History: The patient  reports that he quit smoking about 42 years ago. He has a 15.00 pack-year smoking history. He has never used smokeless tobacco. He reports that he drinks alcohol. He reports that he does not use drugs.   Family History: The patient's family history includes Heart attack (age of onset: 3) in his father; Heart disease in his father; Hypertension in his mother.   Review of Systems: Please see the history of present illness.   Otherwise, the review of systems is positive for none.   All other systems are reviewed and negative.   Physical Exam: Blood pressure (!) 156/98, pulse 60, height 5\' 11"  (1.803 m), weight 210 lb 12.8 oz (95.6 kg), SpO2 96 %.  GEN:  Well nourished, well developed in no acute distress HEENT: Normal NECK: No JVD; No carotid bruits LYMPHATICS: No lymphadenopathy CARDIAC: RR, no murmurs, rubs, gallops RESPIRATORY:  Clear to auscultation without rales, wheezing or rhonchi  ABDOMEN: Soft, non-tender, non-distended MUSCULOSKELETAL:  No edema; No deformity  SKIN: Warm and dry NEUROLOGIC:  Alert and oriented x 3    LABORATORY DATA:  EKG:  EKG is ordered today.   Oct. 2, 2018:   NSR at 60.  NSR   Lab Results  Component Value Date   WBC 7.6 12/25/2016   HGB 16.6 12/25/2016   HCT 46.9 12/25/2016   PLT 213 12/25/2016   GLUCOSE 111 (H) 01/08/2017   CHOL 156 01/08/2017   TRIG 101 01/08/2017   HDL 53 01/08/2017   LDLDIRECT 103.7 01/03/2012   LDLCALC 83 01/08/2017   ALT 22 01/08/2017   AST 22 01/08/2017   NA 140 01/08/2017   K 4.0 01/08/2017   CL 100 01/08/2017   CREATININE 0.80 01/08/2017   BUN 13 01/08/2017   CO2 23 01/08/2017   TSH 0.58 06/30/2015   INR 0.98 12/25/2016    BNP (last 3 results) No results for input(s): BNP in the last 8760 hours.  ProBNP (last 3 results) No results for input(s): PROBNP in the last 8760 hours.   Other Studies Reviewed Today:   Assessment/Plan: 1. Essential Hypertension:Blood  pressure is a little elevated today but he insists that it is normally takes at home. We will have him bring his blood pressure log.  2. Hypercholesterolemia -  recent labs look good. He's currently on Vytorin. Continue current medications. We'll recheck his fasting lipids and complete metabolic profile in one year.  4. Hypothyroidism -  managed by his primary medical doctor  The following changes have been made:  See above.  Labs/ tests ordered today include:   No orders of the defined types were placed in this encounter.  Mertie Moores, MD  01/14/2017 8:23 AM    [address],  [address] [place], [place]  [address] Pager [phone] Phone: [phone]; Fax: [phone]

## 2017-01-14 NOTE — Patient Instructions (Signed)
Medication Instructions:   WE HAVE TAKEN YOUR ASPIRIN 81 MG OFF YOUR MEDICATION LIST.    Labwork:  PRIOR TO YOUR 1 YEAR OFFICE VISIT WITH DR. Jetty Duhamel WILL CHECK A CMET AND LIPIDS---PLEASE COME FASTING TO THIS LAB APPOINTMENT      Follow-Up:  Your physician wants you to follow-up in: [place] DR. Acie Fredrickson You will receive a reminder letter in the mail two months in advance. If you don't receive a letter, please call our office to schedule the follow-up appointment.  PLEASE HAVE YOUR LABS DONE A WEEK PRIOR TO THIS APPOINMENT        If you need a refill on your cardiac medications before your next appointment, please call your pharmacy.

## 2017-02-05 ENCOUNTER — Ambulatory Visit (INDEPENDENT_AMBULATORY_CARE_PROVIDER_SITE_OTHER): Payer: Medicare Other | Admitting: Physician Assistant

## 2017-02-05 ENCOUNTER — Encounter (INDEPENDENT_AMBULATORY_CARE_PROVIDER_SITE_OTHER): Payer: Self-pay | Admitting: Physician Assistant

## 2017-02-05 ENCOUNTER — Ambulatory Visit (INDEPENDENT_AMBULATORY_CARE_PROVIDER_SITE_OTHER): Payer: Medicare Other

## 2017-02-05 DIAGNOSIS — M1711 Unilateral primary osteoarthritis, right knee: Secondary | ICD-10-CM

## 2017-02-05 DIAGNOSIS — G8929 Other chronic pain: Secondary | ICD-10-CM

## 2017-02-05 DIAGNOSIS — M25562 Pain in left knee: Secondary | ICD-10-CM

## 2017-02-05 DIAGNOSIS — M25561 Pain in right knee: Secondary | ICD-10-CM

## 2017-02-05 DIAGNOSIS — M1712 Unilateral primary osteoarthritis, left knee: Secondary | ICD-10-CM | POA: Diagnosis not present

## 2017-02-05 DIAGNOSIS — M170 Bilateral primary osteoarthritis of knee: Secondary | ICD-10-CM

## 2017-02-05 MED ORDER — LIDOCAINE HCL 1 % IJ SOLN
3.0000 mL | INTRAMUSCULAR | Status: AC | PRN
  Administered 2017-02-05: 3 mL

## 2017-02-05 MED ORDER — NAPROXEN 500 MG PO TABS: 500.0000 mg | ORAL_TABLET | Freq: Two times a day (BID) | ORAL | 1 refills | Status: DC

## 2017-02-05 MED ORDER — METHYLPREDNISOLONE ACETATE 40 MG/ML IJ SUSP
40.0000 mg | INTRAMUSCULAR | Status: AC | PRN
  Administered 2017-02-05: 40 mg via INTRA_ARTICULAR

## 2017-02-05 NOTE — Progress Notes (Signed)
Office Visit Note   Patient: Carlos Juarez           Date of Birth: March 12, 1941           MRN: [id number] Visit Date: 02/05/2017              Requested by: Carlos Peng, NP [address] [place], [place] [address] PCP: Carlos Peng, NP   Assessment & Plan: Visit Diagnoses:  1. Chronic pain of both knees   2. Primary osteoarthritis of both knees     Plan: He will follow with Dr. Ninfa Juarez in 2 weeks to check his progress lack of with the left knee injection.  He may benefit from a supplemental injection in the future versus possible knee replacement in future.  Did encourage quad strengthening.  Placed him on naproxen he is to discontinue his Celebrex.  No other NSAIDs while on the naproxen.  Follow-Up Instructions: Return in about 2 weeks (around 02/19/2017).   Orders:  Orders Placed This Encounter  Procedures  . Large Joint Injection/Arthrocentesis  . XR KNEE 3 VIEW RIGHT  . XR Knee 1-2 Views Left   Meds ordered this encounter  Medications  . naproxen (NAPROSYN) 500 MG tablet    Sig: Take 1 tablet (500 mg total) by mouth 2 (two) times daily with a meal.    Dispense:  60 tablet    Refill:  1      Procedures: Large Joint Inj Date/Time: 02/05/2017 5:11 PM Performed by: Carlos Juarez Authorized by: Carlos Juarez   Consent Given by:  Patient Indications:  Pain Location:  Knee Site:  L knee Needle Size:  22 G Needle Length:  1.5 inches Approach:  Superolateral Ultrasound Guidance: No   Fluoroscopic Guidance: No   Medications:  40 mg methylPREDNISolone acetate 40 MG/ML; 3 mL lidocaine 1 % Aspiration Attempted: Yes   Aspirate amount (mL):  20 Aspirate:  Yellow Patient tolerance:  Patient tolerated the procedure well with no immediate complications     Clinical Data: No additional findings.   Subjective: Chief Complaint  Patient presents with  . Left Knee - Pain  . Right Knee - Pain    HPI Carlos Juarez is a 76 year old male who comes  in today with bilateral knee pain here for second opinion.  States he has pain both knees mostly posterior aspect of the knees.  Flexion of the knees causes increased pain.  He states he has difficulty with ambulating up and down stairs.  Occasionally has pain under the kneecaps bilaterally.  He has had no true giving way of the knees but feels as if they may at some point time.  No painful popping catching or locking.  He has had cortisone injections in the knees but are becoming less and less effective.  States has had multiple aspirations of the knees.  He has tried Celebrex and other anti-inflammatories without any real relief.  Celebrex helps the most but that causes some swelling if he takes it more than a few days. Review of Systems Denies any chest pain shortness breath fevers chills.  No true mechanical symptoms.  Occasional effusion of the knees.  Bilateral knee pain.  Objective: Vital Signs: There were no vitals taken for this visit.  Physical Exam  Constitutional: He is oriented to person, place, and time. He appears well-developed and well-nourished. No distress.  Pulmonary/Chest: Effort normal.  Neurological: He is alert and oriented to person, place, and time.  Skin: He is not  diaphoretic.  Psychiatric: He has a normal mood and affect.    Ortho Exam Bilateral knees with crepitus passive range of motion.  No instability valgus varus stressing.  He has tenderness along the medial joint line of left knee lateral joint line of the right knee.  Slight area of ecchymosis lateral joint line of the right knee.  No effusion abnormal warmth erythema or edema of the right knee left knee with small effusion no ecchymosis no erythema.  Good range of motion of both knees without significant pain.  Specialty Comments:  No specialty comments available.  Imaging: Xr Knee 1-2 Views Left  Result Date: 02/05/2017 Left knee complete: Moderate medial compartmental narrowing.  Mild lateral  compartmental changes.  Moderate patellofemoral changes.  No acute fracture.  Knee is well located otherwise.  Xr Knee 3 View Right  Result Date: 02/05/2017 Right kneeComplete: Mild medial and lateral compartmental changes.  Severe patellofemoral changes.  No acute fractures.      PMFS History: Patient Active Problem List   Diagnosis Date Noted  . S/P lumbar laminectomy 12/25/2016  . 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 HTN (hypertension)   . Hypercholesteremia   . Hypothyroidism   . PVC's (premature ventricular contractions)    Past Medical History:  Diagnosis Date  . Arthritis   . HTN (hypertension)   . Hypercholesteremia   . Hypothyroidism   . Pneumonia   . PVC's (premature ventricular contractions)   . Synovial cyst     Family History  Problem Relation Age of Onset  . Hypertension Mother   . Heart disease Father   . Heart attack Father 91    Past Surgical History:  Procedure Laterality Date  . HERNIA REPAIR    . LUMBAR LAMINECTOMY/DECOMPRESSION MICRODISCECTOMY N/A 12/25/2016   Procedure: Laminectomy and Foraminotomy - Lumbar two-Lumbar three - Lumbar three-Lumbar four, resection of synovial cyst;  Surgeon: Carlos Moore, MD;  Location: [facility];  Service: Neurosurgery;  Laterality: N/A;  . MENISCUS REPAIR Left   . TONSILLECTOMY     Social History   Occupational History  . Not on file.   Social History Main Topics  . Smoking status: Former Smoker    Packs/day: 1.00    Years: 15.00    Quit date: 07/15/1974  . Smokeless tobacco: Never Used     Comment: smoked heavy 15 years and then quit  . Alcohol use 0.0 oz/week     Comment: Wine with meals at times  . Drug use: No  . Sexual activity: Not on file

## 2017-02-19 ENCOUNTER — Encounter (INDEPENDENT_AMBULATORY_CARE_PROVIDER_SITE_OTHER): Payer: Self-pay | Admitting: Orthopaedic Surgery

## 2017-02-19 ENCOUNTER — Ambulatory Visit (INDEPENDENT_AMBULATORY_CARE_PROVIDER_SITE_OTHER): Payer: Medicare Other | Admitting: Orthopaedic Surgery

## 2017-02-19 DIAGNOSIS — M25562 Pain in left knee: Secondary | ICD-10-CM | POA: Diagnosis not present

## 2017-02-19 DIAGNOSIS — G8929 Other chronic pain: Secondary | ICD-10-CM | POA: Diagnosis not present

## 2017-02-19 DIAGNOSIS — M1712 Unilateral primary osteoarthritis, left knee: Secondary | ICD-10-CM | POA: Diagnosis not present

## 2017-02-19 NOTE — Progress Notes (Signed)
Patient is following up after having a steroid injection in his left knee.  He has known moderate arthritis of that left knee and he said the injection did help for a little bit but the pain is coming back.  He is someone whose had an arthroscopic intervention remotely on that knee.  He has x-rays that show tricompartmental arthritic changes mainly involving the patellofemoral joint and the medial compartment of his knee.  There is varus malalignment with joint space narrowing and sclerotic changes.  There are para-articular space of the knee.  He has been doing this for over a year now.  He is very healthy individual and does work on quad strengthening exercises, anti-inflammatories, activity modification, an arthroscopic intervention and multiple injections.  He is gotten to where his knee pain is daily.  It is detrimentally affecting activity living his quality of life and his mobility.  He can be 10 out of 10 at times.  At this point he does wish to proceed with a total knee arthroplasty.  We spent a long period time discussing knee replacement surgery.  We went over his x-rays again as well as shown a knee model and having a thorough discussion about his intraoperative and postoperative course as well as a thorough discussion of the risks and benefits of surgery.  On exam he does have excellent range of motion of left knee but a lot of posterior lateral and posterior medial pain.  There is patellofemoral crepitation is quite significant as patella is lateral retracting and his varus malalignment of his knee.  He feels ligamentously stable.  Given the severity of his arthritis I agree with proceeding with knee replacement surgery.  We will work on getting this set up and then would see him back in 2 weeks postoperative for continued follow-up.  At some point will likely need to address his right knee given the arthritis in the right knee as well.

## 2017-02-22 ENCOUNTER — Other Ambulatory Visit: Payer: Self-pay | Admitting: Cardiovascular Disease

## 2017-03-19 ENCOUNTER — Other Ambulatory Visit (INDEPENDENT_AMBULATORY_CARE_PROVIDER_SITE_OTHER): Payer: Self-pay | Admitting: Physician Assistant

## 2017-03-24 NOTE — Progress Notes (Signed)
ekg 01-14-17 epic  cxr 12-25-16 epic

## 2017-03-24 NOTE — Patient Instructions (Signed)
Carlos Juarez  03/24/2017   Your procedure is scheduled on: 03-28-17  Report to [facility] Main  Entrance   Report to admitting at     [facility] AM   Call this number if you have problems the morning of surgery [phone] /[phone]   Remember: ONLY 1 PERSON MAY GO WITH YOU TO SHORT STAY TO GET  READY MORNING OF [place].  Do not eat food or drink liquids :After Midnight.     Take these medicines the morning of surgery with A SIP OF WATER: atenelol, synthroid                                You may not have any metal on your body including hair pins and              piercings  Do not wear jewelry, , lotions, powders or perfumes, deodorant    .              Men may shave face and neck.   Do not bring valuables to the hospital. [place].  Contacts, dentures or bridgework may not be worn into surgery.  Leave suitcase in the car. After surgery it may be brought to your room.               Please read over the following fact sheets you were given: _____________________________________________________________________           [facility] - Preparing for Surgery Before surgery, you can play an important role.  Because skin is not sterile, your skin needs to be as free of germs as possible.  You can reduce the number of germs on your skin by washing with CHG (chlorahexidine gluconate) soap before surgery.  CHG is an antiseptic cleaner which kills germs and bonds with the skin to continue killing germs even after washing. Please DO NOT use if you have an allergy to CHG or antibacterial soaps.  If your skin becomes reddened/irritated stop using the CHG and inform your nurse when you arrive at Short Stay. Do not shave (including legs and underarms) for at least 48 hours prior to the first CHG shower.  You may shave your face/neck. Please follow these instructions carefully:  1.  Shower with CHG Soap the  night before surgery and the  morning of Surgery.  2.  If you choose to wash your hair, wash your hair first as usual with your  normal  shampoo.  3.  After you shampoo, rinse your hair and body thoroughly to remove the  shampoo.                           4.  Use CHG as you would any other liquid soap.  You can apply chg directly  to the skin and wash                       Gently with a scrungie or clean washcloth.  5.  Apply the CHG Soap to your body ONLY FROM THE NECK DOWN.   Do not use on face/ open  Wound or open sores. Avoid contact with eyes, ears mouth and genitals (private parts).                       Wash face,  Genitals (private parts) with your normal soap.             6.  Wash thoroughly, paying special attention to the area where your surgery  will be performed.  7.  Thoroughly rinse your body with warm water from the neck down.  8.  DO NOT shower/wash with your normal soap after using and rinsing off  the CHG Soap.                9.  Pat yourself dry with a clean towel.            10.  Wear clean pajamas.            11.  Place clean sheets on your bed the night of your first shower and do not  sleep with pets. Day of Surgery : Do not apply any lotions/deodorants the morning of surgery.  Please wear clean clothes to the hospital/surgery center.  FAILURE TO FOLLOW THESE INSTRUCTIONS MAY RESULT IN THE CANCELLATION OF YOUR SURGERY PATIENT SIGNATURE_________________________________  NURSE SIGNATURE__________________________________  ________________________________________________________________________   Adam Phenix  An incentive spirometer is a tool that can help keep your lungs clear and active. This tool measures how well you are filling your lungs with each breath. Taking long deep breaths may help reverse or decrease the chance of developing breathing (pulmonary) problems (especially infection) following:  A long period of time when you  are unable to move or be active. BEFORE THE PROCEDURE   If the spirometer includes an indicator to show your best effort, your nurse or respiratory therapist will set it to a desired goal.  If possible, sit up straight or lean slightly forward. Try not to slouch.  Hold the incentive spirometer in an upright position. INSTRUCTIONS FOR USE  1. Sit on the edge of your bed if possible, or sit up as far as you can in bed or on a chair. 2. Hold the incentive spirometer in an upright position. 3. Breathe out normally. 4. Place the mouthpiece in your mouth and seal your lips tightly around it. 5. Breathe in slowly and as deeply as possible, raising the piston or the ball toward the top of the column. 6. Hold your breath for 3-5 seconds or for as long as possible. Allow the piston or ball to fall to the bottom of the column. 7. Remove the mouthpiece from your mouth and breathe out normally. 8. Rest for a few seconds and repeat Steps 1 through 7 at least 10 times every 1-2 hours when you are awake. Take your time and take a few normal breaths between deep breaths. 9. The spirometer may include an indicator to show your best effort. Use the indicator as a goal to work toward during each repetition. 10. After each set of 10 deep breaths, practice coughing to be sure your lungs are clear. If you have an incision (the cut made at the time of surgery), support your incision when coughing by placing a pillow or rolled up towels firmly against it. Once you are able to get out of bed, walk around indoors and cough well. You may stop using the incentive spirometer when instructed by your caregiver.  RISKS AND COMPLICATIONS  Take your time so you do not get  dizzy or light-headed.  If you are in pain, you may need to take or ask for pain medication before doing incentive spirometry. It is harder to take a deep breath if you are having pain. AFTER USE  Rest and breathe slowly and easily.  It can be helpful to  keep track of a log of your progress. Your caregiver can provide you with a simple table to help with this. If you are using the spirometer at home, follow these instructions: [place] IF:   You are having difficultly using the spirometer.  You have trouble using the spirometer as often as instructed.  Your pain medication is not giving enough relief while using the spirometer.  You develop fever of 100.5 F (38.1 C) or higher. SEEK IMMEDIATE MEDICAL CARE IF:   You cough up bloody sputum that had not been present before.  You develop fever of 102 F (38.9 C) or greater.  You develop worsening pain at or near the incision site. MAKE SURE YOU:   Understand these instructions.  Will watch your condition.  Will get help right away if you are not doing well or get worse. Document Released: 08/12/2006 Document Revised: 06/24/2011 Document Reviewed: 10/13/2006 [facility] Patient Information 2014 [place], [place].   ________________________________________________________________________

## 2017-03-25 ENCOUNTER — Encounter (HOSPITAL_COMMUNITY)
Admission: RE | Admit: 2017-03-25 | Discharge: 2017-03-25 | Disposition: A | Discharge status: Home / Self Care | Payer: Medicare Other | Source: Ambulatory Visit | Attending: Orthopaedic Surgery | Admitting: Orthopaedic Surgery

## 2017-03-25 ENCOUNTER — Other Ambulatory Visit: Payer: Self-pay

## 2017-03-25 ENCOUNTER — Encounter (HOSPITAL_COMMUNITY): Payer: Self-pay

## 2017-03-25 DIAGNOSIS — Z01812 Encounter for preprocedural laboratory examination: Secondary | ICD-10-CM | POA: Insufficient documentation

## 2017-03-25 DIAGNOSIS — M1712 Unilateral primary osteoarthritis, left knee: Secondary | ICD-10-CM

## 2017-03-25 HISTORY — DX: Malignant (primary) neoplasm, unspecified: C80.1

## 2017-03-25 LAB — BASIC METABOLIC PANEL
ANION GAP: 8 (ref 5–15)
BUN: 20 mg/dL (ref 6–20)
CALCIUM: 9.2 mg/dL (ref 8.9–10.3)
CO2: 28 mmol/L (ref 22–32)
Chloride: 104 mmol/L (ref 101–111)
Creatinine, Ser: 1.06 mg/dL (ref 0.61–1.24)
GFR calc Af Amer: >60 mL/min (ref >60)
Glucose, Bld: 105 mg/dL — ABNORMAL HIGH (ref 65–99)
POTASSIUM: 4.1 mmol/L (ref 3.5–5.1)
SODIUM: 140 mmol/L (ref 135–145)

## 2017-03-25 LAB — CBC
HCT: 47.9 % (ref 39.0–52.0)
Hemoglobin: 16.9 g/dL (ref 13.0–17.0)
MCH: 32.3 pg (ref 26.0–34.0)
MCHC: 35.3 g/dL (ref 30.0–36.0)
MCV: 91.6 fL (ref 78.0–100.0)
PLATELETS: 240 10*3/uL (ref 150–400)
RBC: 5.23 MIL/uL (ref 4.22–5.81)
RDW: 12.6 % (ref 11.5–15.5)
WBC: 10.1 10*3/uL (ref 4.0–10.5)

## 2017-03-25 LAB — SURGICAL PCR SCREEN
MRSA, PCR: NEGATIVE
Staphylococcus aureus: NEGATIVE

## 2017-03-28 ENCOUNTER — Inpatient Hospital Stay (HOSPITAL_COMMUNITY): Payer: Medicare Other

## 2017-03-28 ENCOUNTER — Inpatient Hospital Stay (HOSPITAL_COMMUNITY): Payer: Medicare Other | Admitting: Anesthesiology

## 2017-03-28 ENCOUNTER — Encounter (HOSPITAL_COMMUNITY)
Admission: RE | Disposition: A | Discharge status: Home Health | Payer: Self-pay | Source: Ambulatory Visit | Attending: Orthopaedic Surgery

## 2017-03-28 ENCOUNTER — Inpatient Hospital Stay (HOSPITAL_COMMUNITY)
Admission: RE | Admit: 2017-03-28 | Discharge: 2017-03-31 | DRG: 470 | Disposition: A | Discharge status: Home Health | Payer: Medicare Other | Source: Ambulatory Visit | Attending: Orthopaedic Surgery | Admitting: Orthopaedic Surgery

## 2017-03-28 ENCOUNTER — Encounter (HOSPITAL_COMMUNITY): Payer: Self-pay

## 2017-03-28 ENCOUNTER — Other Ambulatory Visit: Payer: Self-pay

## 2017-03-28 DIAGNOSIS — E7800 Pure hypercholesterolemia, unspecified: Secondary | ICD-10-CM | POA: Diagnosis not present

## 2017-03-28 DIAGNOSIS — I10 Essential (primary) hypertension: Secondary | ICD-10-CM | POA: Diagnosis not present

## 2017-03-28 DIAGNOSIS — G8918 Other acute postprocedural pain: Secondary | ICD-10-CM | POA: Diagnosis not present

## 2017-03-28 DIAGNOSIS — Z85828 Personal history of other malignant neoplasm of skin: Secondary | ICD-10-CM

## 2017-03-28 DIAGNOSIS — M1712 Unilateral primary osteoarthritis, left knee: Principal | ICD-10-CM | POA: Diagnosis present

## 2017-03-28 DIAGNOSIS — Z79899 Other long term (current) drug therapy: Secondary | ICD-10-CM

## 2017-03-28 DIAGNOSIS — E039 Hypothyroidism, unspecified: Secondary | ICD-10-CM | POA: Diagnosis present

## 2017-03-28 DIAGNOSIS — Z87891 Personal history of nicotine dependence: Secondary | ICD-10-CM

## 2017-03-28 DIAGNOSIS — Z96652 Presence of left artificial knee joint: Secondary | ICD-10-CM

## 2017-03-28 DIAGNOSIS — M25562 Pain in left knee: Secondary | ICD-10-CM | POA: Diagnosis not present

## 2017-03-28 DIAGNOSIS — Z471 Aftercare following joint replacement surgery: Secondary | ICD-10-CM | POA: Diagnosis not present

## 2017-03-28 HISTORY — PX: TOTAL KNEE ARTHROPLASTY: SHX125

## 2017-03-28 SURGERY — ARTHROPLASTY, KNEE, TOTAL
Anesthesia: Spinal | Laterality: Left

## 2017-03-28 MED ORDER — CHLORHEXIDINE GLUCONATE 4 % EX LIQD: 60.0000 mL | Freq: Once | CUTANEOUS | Status: DC

## 2017-03-28 MED ORDER — PHENYLEPHRINE HCL 10 MG/ML IJ SOLN
INTRAVENOUS | Status: DC | PRN
  Administered 2017-03-28: 50 ug/min via INTRAVENOUS

## 2017-03-28 MED ORDER — CEFAZOLIN SODIUM-DEXTROSE 2-4 GM/100ML-% IV SOLN
INTRAVENOUS | Status: AC
  Filled 2017-03-28: qty 100

## 2017-03-28 MED ORDER — ALUM & MAG HYDROXIDE-SIMETH 200-200-20 MG/5ML PO SUSP: 30.0000 mL | ORAL | Status: DC | PRN

## 2017-03-28 MED ORDER — METOCLOPRAMIDE HCL 5 MG/ML IJ SOLN: 5.0000 mg | Freq: Three times a day (TID) | INTRAMUSCULAR | Status: DC | PRN

## 2017-03-28 MED ORDER — OXYCODONE HCL 5 MG PO TABS
10.0000 mg | ORAL_TABLET | ORAL | Status: DC | PRN
  Administered 2017-03-28: 22:00:00 10 mg via ORAL
  Administered 2017-03-28 (×2): 5 mg via ORAL
  Administered 2017-03-28 – 2017-03-31 (×11): 10 mg via ORAL
  Filled 2017-03-28 (×13): qty 2

## 2017-03-28 MED ORDER — DEXAMETHASONE SODIUM PHOSPHATE 10 MG/ML IJ SOLN
INTRAMUSCULAR | Status: AC
  Filled 2017-03-28: qty 1

## 2017-03-28 MED ORDER — ACETAMINOPHEN 325 MG PO TABS
650.0000 mg | ORAL_TABLET | ORAL | Status: DC | PRN
  Administered 2017-03-29 – 2017-03-30 (×3): 650 mg via ORAL
  Filled 2017-03-28 (×3): qty 2

## 2017-03-28 MED ORDER — PHENYLEPHRINE 40 MCG/ML (10ML) SYRINGE FOR IV PUSH (FOR BLOOD PRESSURE SUPPORT)
PREFILLED_SYRINGE | INTRAVENOUS | Status: AC
  Filled 2017-03-28: qty 10

## 2017-03-28 MED ORDER — BUPIVACAINE IN DEXTROSE 0.75-8.25 % IT SOLN
INTRATHECAL | Status: DC | PRN
  Administered 2017-03-28: 2 mL via INTRATHECAL

## 2017-03-28 MED ORDER — LACTATED RINGERS IV SOLN
INTRAVENOUS | Status: DC
  Administered 2017-03-28: 11:00:00 via INTRAVENOUS
  Administered 2017-03-28: 1000 mL via INTRAVENOUS
  Administered 2017-03-28: 15:00:00 via INTRAVENOUS

## 2017-03-28 MED ORDER — ACETAMINOPHEN 650 MG RE SUPP: 650.0000 mg | RECTAL | Status: DC | PRN

## 2017-03-28 MED ORDER — DIPHENHYDRAMINE HCL 12.5 MG/5ML PO ELIX: 12.5000 mg | ORAL_SOLUTION | ORAL | Status: DC | PRN

## 2017-03-28 MED ORDER — MENTHOL 3 MG MT LOZG: 1.0000 | LOZENGE | OROMUCOSAL | Status: DC | PRN

## 2017-03-28 MED ORDER — SODIUM CHLORIDE 0.9 % IV SOLN
INTRAVENOUS | Status: DC
  Administered 2017-03-28: 16:00:00 75 mL/h via INTRAVENOUS

## 2017-03-28 MED ORDER — PROPOFOL 10 MG/ML IV BOLUS
INTRAVENOUS | Status: DC | PRN
  Administered 2017-03-28: 20 mg via INTRAVENOUS

## 2017-03-28 MED ORDER — ATENOLOL 50 MG PO TABS
50.0000 mg | ORAL_TABLET | Freq: Every day | ORAL | Status: DC
  Administered 2017-03-29 – 2017-03-31 (×3): 50 mg via ORAL
  Filled 2017-03-28 (×3): qty 1

## 2017-03-28 MED ORDER — METOCLOPRAMIDE HCL 5 MG PO TABS: 5.0000 mg | ORAL_TABLET | Freq: Three times a day (TID) | ORAL | Status: DC | PRN

## 2017-03-28 MED ORDER — ONDANSETRON HCL 4 MG/2ML IJ SOLN: 4.0000 mg | Freq: Four times a day (QID) | INTRAMUSCULAR | Status: DC | PRN

## 2017-03-28 MED ORDER — MIDAZOLAM HCL 2 MG/2ML IJ SOLN
1.0000 mg | INTRAMUSCULAR | Status: DC | PRN
  Administered 2017-03-28: 0.5 mg via INTRAVENOUS

## 2017-03-28 MED ORDER — MIDAZOLAM HCL 2 MG/2ML IJ SOLN: 0.5000 mg | Freq: Once | INTRAMUSCULAR | Status: DC | PRN

## 2017-03-28 MED ORDER — LEVOTHYROXINE SODIUM 75 MCG PO TABS
150.0000 ug | ORAL_TABLET | Freq: Every day | ORAL | Status: DC
  Administered 2017-03-29 – 2017-03-31 (×3): 150 ug via ORAL
  Filled 2017-03-28 (×3): qty 2

## 2017-03-28 MED ORDER — CEFAZOLIN SODIUM-DEXTROSE 2-4 GM/100ML-% IV SOLN
2.0000 g | INTRAVENOUS | Status: AC
  Administered 2017-03-28: 2 g via INTRAVENOUS

## 2017-03-28 MED ORDER — HYDROMORPHONE HCL 1 MG/ML IJ SOLN
0.2500 mg | INTRAMUSCULAR | Status: DC | PRN
  Administered 2017-03-28: 0.5 mg via INTRAVENOUS

## 2017-03-28 MED ORDER — TRANEXAMIC ACID 1000 MG/10ML IV SOLN
1000.0000 mg | INTRAVENOUS | Status: AC
  Administered 2017-03-28: 1000 mg via INTRAVENOUS
  Filled 2017-03-28: qty 1100

## 2017-03-28 MED ORDER — PHENYLEPHRINE HCL 10 MG/ML IJ SOLN
INTRAMUSCULAR | Status: AC
  Filled 2017-03-28: qty 1

## 2017-03-28 MED ORDER — POLYETHYLENE GLYCOL 3350 17 G PO PACK: 17.0000 g | PACK | Freq: Every day | ORAL | Status: DC | PRN

## 2017-03-28 MED ORDER — ONDANSETRON HCL 4 MG PO TABS: 4.0000 mg | ORAL_TABLET | Freq: Four times a day (QID) | ORAL | Status: DC | PRN

## 2017-03-28 MED ORDER — PHENYLEPHRINE 40 MCG/ML (10ML) SYRINGE FOR IV PUSH (FOR BLOOD PRESSURE SUPPORT)
PREFILLED_SYRINGE | INTRAVENOUS | Status: DC | PRN
  Administered 2017-03-28 (×2): 80 ug via INTRAVENOUS

## 2017-03-28 MED ORDER — PROMETHAZINE HCL 25 MG/ML IJ SOLN: 6.2500 mg | INTRAMUSCULAR | Status: DC | PRN

## 2017-03-28 MED ORDER — OXYMETAZOLINE HCL 0.05 % NA SOLN: 1.0000 | Freq: Two times a day (BID) | NASAL | Status: DC | PRN

## 2017-03-28 MED ORDER — CEFAZOLIN SODIUM-DEXTROSE 1-4 GM/50ML-% IV SOLN
1.0000 g | Freq: Four times a day (QID) | INTRAVENOUS | Status: AC
  Administered 2017-03-28 (×2): 1 g via INTRAVENOUS
  Filled 2017-03-28 (×2): qty 50

## 2017-03-28 MED ORDER — FENTANYL CITRATE (PF) 100 MCG/2ML IJ SOLN
INTRAMUSCULAR | Status: AC
  Filled 2017-03-28: qty 2

## 2017-03-28 MED ORDER — PHENOL 1.4 % MT LIQD: 1.0000 | OROMUCOSAL | Status: DC | PRN

## 2017-03-28 MED ORDER — PROPOFOL 10 MG/ML IV BOLUS
INTRAVENOUS | Status: AC
  Filled 2017-03-28: qty 60

## 2017-03-28 MED ORDER — ZOLPIDEM TARTRATE 5 MG PO TABS
5.0000 mg | ORAL_TABLET | Freq: Every evening | ORAL | Status: DC | PRN
  Administered 2017-03-28: 22:00:00 5 mg via ORAL
  Filled 2017-03-28: qty 1

## 2017-03-28 MED ORDER — PROPOFOL 500 MG/50ML IV EMUL
INTRAVENOUS | Status: DC | PRN
  Administered 2017-03-28: 100 ug/kg/min via INTRAVENOUS

## 2017-03-28 MED ORDER — ROPIVACAINE HCL 7.5 MG/ML IJ SOLN
INTRAMUSCULAR | Status: DC | PRN
  Administered 2017-03-28: 20 mL via PERINEURAL

## 2017-03-28 MED ORDER — HYDROCODONE-ACETAMINOPHEN 5-325 MG PO TABS: 1.0000 | ORAL_TABLET | ORAL | Status: DC | PRN

## 2017-03-28 MED ORDER — DOCUSATE SODIUM 100 MG PO CAPS
100.0000 mg | ORAL_CAPSULE | Freq: Two times a day (BID) | ORAL | Status: DC
  Administered 2017-03-28 – 2017-03-31 (×6): 100 mg via ORAL
  Filled 2017-03-28 (×6): qty 1

## 2017-03-28 MED ORDER — SODIUM CHLORIDE 0.9 % IR SOLN
Status: DC | PRN
  Administered 2017-03-28: 3000 mL

## 2017-03-28 MED ORDER — MEPERIDINE HCL 50 MG/ML IJ SOLN: 6.2500 mg | INTRAMUSCULAR | Status: DC | PRN

## 2017-03-28 MED ORDER — ONDANSETRON HCL 4 MG/2ML IJ SOLN
INTRAMUSCULAR | Status: AC
  Filled 2017-03-28: qty 2

## 2017-03-28 MED ORDER — FENTANYL CITRATE (PF) 100 MCG/2ML IJ SOLN
50.0000 ug | INTRAMUSCULAR | Status: DC | PRN
  Administered 2017-03-28: 100 ug via INTRAVENOUS

## 2017-03-28 MED ORDER — DEXTROSE 5 % IV SOLN
500.0000 mg | Freq: Four times a day (QID) | INTRAVENOUS | Status: DC | PRN
  Administered 2017-03-28: 500 mg via INTRAVENOUS
  Filled 2017-03-28: qty 550

## 2017-03-28 MED ORDER — HYDROMORPHONE HCL 1 MG/ML IJ SOLN
1.0000 mg | INTRAMUSCULAR | Status: DC | PRN
  Administered 2017-03-28 – 2017-03-29 (×2): 1 mg via INTRAVENOUS
  Filled 2017-03-28 (×2): qty 1

## 2017-03-28 MED ORDER — HYDROMORPHONE HCL 1 MG/ML IJ SOLN
INTRAMUSCULAR | Status: AC
  Filled 2017-03-28: qty 1

## 2017-03-28 MED ORDER — METHOCARBAMOL 500 MG PO TABS
500.0000 mg | ORAL_TABLET | Freq: Four times a day (QID) | ORAL | Status: DC | PRN
  Administered 2017-03-28 – 2017-03-30 (×4): 500 mg via ORAL
  Filled 2017-03-28 (×5): qty 1

## 2017-03-28 MED ORDER — ASPIRIN EC 325 MG PO TBEC
325.0000 mg | DELAYED_RELEASE_TABLET | Freq: Two times a day (BID) | ORAL | Status: DC
  Administered 2017-03-28 – 2017-03-31 (×6): 325 mg via ORAL
  Filled 2017-03-28 (×6): qty 1

## 2017-03-28 MED ORDER — MIDAZOLAM HCL 2 MG/2ML IJ SOLN
INTRAMUSCULAR | Status: AC
  Administered 2017-03-28: 0.5 mg via INTRAVENOUS
  Filled 2017-03-28: qty 2

## 2017-03-28 MED ORDER — EZETIMIBE-SIMVASTATIN 10-20 MG PO TABS
1.0000 | ORAL_TABLET | Freq: Every day | ORAL | Status: DC
  Administered 2017-03-28 – 2017-03-30 (×3): 1 via ORAL
  Filled 2017-03-28 (×3): qty 1

## 2017-03-28 SURGICAL SUPPLY — 47 items
APL SKNCLS STERI-STRIP NONHPOA (GAUZE/BANDAGES/DRESSINGS)
BAG SPEC THK2 15X12 ZIP CLS (MISCELLANEOUS)
BAG ZIPLOCK 12X15 (MISCELLANEOUS) IMPLANT
BANDAGE ACE 6X5 VEL STRL LF (GAUZE/BANDAGES/DRESSINGS) ×3 IMPLANT
BENZOIN TINCTURE PRP APPL 2/3 (GAUZE/BANDAGES/DRESSINGS) IMPLANT
BLADE SAG 18X100X1.27 (BLADE) ×2 IMPLANT
BOWL SMART MIX CTS (DISPOSABLE) ×2 IMPLANT
CAPT KNEE TRIATH TK-4 ×2 IMPLANT
CLOSURE WOUND 1/2 X4 (GAUZE/BANDAGES/DRESSINGS) ×1
COVER SURGICAL LIGHT HANDLE (MISCELLANEOUS) ×3 IMPLANT
CUFF TOURN SGL QUICK 34 (TOURNIQUET CUFF) ×3
CUFF TRNQT CYL 34X4X40X1 (TOURNIQUET CUFF) ×1 IMPLANT
DRAPE U-SHAPE 47X51 STRL (DRAPES) ×3 IMPLANT
DRSG AQUACEL AG ADV 3.5X10 (GAUZE/BANDAGES/DRESSINGS) ×3 IMPLANT
DRSG PAD ABDOMINAL 8X10 ST (GAUZE/BANDAGES/DRESSINGS) ×3 IMPLANT
DURAPREP 26ML APPLICATOR (WOUND CARE) ×3 IMPLANT
ELECT REM PT RETURN 15FT ADLT (MISCELLANEOUS) ×3 IMPLANT
GAUZE SPONGE 4X4 12PLY STRL (GAUZE/BANDAGES/DRESSINGS) ×3 IMPLANT
GAUZE XEROFORM 1X8 LF (GAUZE/BANDAGES/DRESSINGS) ×2 IMPLANT
GLOVE BIO SURGEON STRL SZ7.5 (GLOVE) ×3 IMPLANT
GLOVE BIOGEL PI IND STRL 8 (GLOVE) ×2 IMPLANT
GLOVE BIOGEL PI INDICATOR 8 (GLOVE) ×4
GLOVE ECLIPSE 8.0 STRL XLNG CF (GLOVE) ×3 IMPLANT
GOWN STRL REUS W/TWL XL LVL3 (GOWN DISPOSABLE) ×6 IMPLANT
HANDPIECE INTERPULSE COAX TIP (DISPOSABLE) ×3
IMMOBILIZER KNEE 20 (SOFTGOODS) ×3
IMMOBILIZER KNEE 20 THIGH 36 (SOFTGOODS) ×1 IMPLANT
NS IRRIG 1000ML POUR BTL (IV SOLUTION) ×3 IMPLANT
PACK TOTAL KNEE CUSTOM (KITS) ×3 IMPLANT
PAD ABD 8X10 STRL (GAUZE/BANDAGES/DRESSINGS) ×4 IMPLANT
PADDING CAST COTTON 6X4 STRL (CAST SUPPLIES) ×6 IMPLANT
POSITIONER SURGICAL ARM (MISCELLANEOUS) ×3 IMPLANT
SET HNDPC FAN SPRY TIP SCT (DISPOSABLE) ×1 IMPLANT
SET PAD KNEE POSITIONER (MISCELLANEOUS) ×3 IMPLANT
STAPLER VISISTAT 35W (STAPLE) IMPLANT
STRIP CLOSURE SKIN 1/2X4 (GAUZE/BANDAGES/DRESSINGS) ×1 IMPLANT
SUT MNCRL AB 4-0 PS2 18 (SUTURE) ×2 IMPLANT
SUT VIC AB 0 CT1 27 (SUTURE) ×3
SUT VIC AB 0 CT1 27XBRD ANTBC (SUTURE) ×1 IMPLANT
SUT VIC AB 1 CT1 27 (SUTURE) ×6
SUT VIC AB 1 CT1 27XBRD ANTBC (SUTURE) ×2 IMPLANT
SUT VIC AB 2-0 CT1 27 (SUTURE) ×6
SUT VIC AB 2-0 CT1 TAPERPNT 27 (SUTURE) ×2 IMPLANT
TRAY FOLEY W/METER SILVER 16FR (SET/KITS/TRAYS/PACK) ×3 IMPLANT
WATER STERILE IRR 1000ML POUR (IV SOLUTION) ×5 IMPLANT
WRAP KNEE MAXI GEL POST OP (GAUZE/BANDAGES/DRESSINGS) ×3 IMPLANT
YANKAUER SUCT BULB TIP 10FT TU (MISCELLANEOUS) ×3 IMPLANT

## 2017-03-28 NOTE — Anesthesia Procedure Notes (Signed)
Date/Time: 03/28/2017 11:00 AM Performed by: Sharlette Dense, CRNA Oxygen Delivery Method: Simple face mask

## 2017-03-28 NOTE — Brief Op Note (Signed)
03/28/2017  12:30 PM  PATIENT:  Carlos Juarez  76 y.o. male  PRE-OPERATIVE DIAGNOSIS:  Osteoarthritis Left Knee  POST-OPERATIVE DIAGNOSIS:  Osteoarthritis Left Knee  PROCEDURE:  Procedure(s): LEFT TOTAL KNEE ARTHROPLASTY (Left)  SURGEON:  Surgeon(s) and Role:    Mcarthur Rossetti, MD - Primary  PHYSICIAN ASSISTANT: Benita Stabile, PA-C  ANESTHESIA:   regional and spinal  EBL:  50 mL   COUNTS:  YES  TOURNIQUET:   Total Tourniquet Time Documented: Thigh (Left) - 51 minutes Total: Thigh (Left) - 51 minutes   DICTATION: .Other Dictation: Dictation Number [id number]  PLAN OF CARE: Admit to inpatient   PATIENT DISPOSITION:  PACU - hemodynamically stable.   Delay start of Pharmacological VTE agent (>24hrs) due to surgical blood loss or risk of bleeding: no

## 2017-03-28 NOTE — Evaluation (Signed)
Physical Therapy Evaluation Patient Details Name: Carlos Juarez MRN: [id number] DOB: April 13, 1941 Today's Date: 03/28/2017   History of Present Illness  Pt s/p L TKR and with hx of lumbar lami 9/18  Clinical Impression  Pt s/p L TKR and presents with decreased L LE strength/ROM and post op pain limiting functional mobility.  Pt should progress to dc home with family assist.    Follow Up Recommendations Home health PT    Equipment Recommendations  Rolling walker with 5" wheels    Recommendations for Other Services OT consult     Precautions / Restrictions Precautions Precautions: Knee;Fall Required Braces or Orthoses: Knee Immobilizer - Left Knee Immobilizer - Left: Discontinue once straight leg raise with < 10 degree lag Restrictions Weight Bearing Restrictions: No Other Position/Activity Restrictions: WBAT      Mobility  Bed Mobility Overal bed mobility: Needs Assistance Bed Mobility: Supine to Sit     Supine to sit: Min assist     General bed mobility comments: cues for sequence and use of R LE to self assist  Transfers Overall transfer level: Needs assistance   Transfers: Sit to/from Stand Sit to Stand: Min assist;Mod assist         General transfer comment: cues for LE management and use of UEs to self assist  Ambulation/Gait Ambulation/Gait assistance: Min assist Ambulation Distance (Feet): 28 Feet Assistive device: Rolling walker (2 wheeled) Gait Pattern/deviations: Step-to pattern;Decreased step length - right;Decreased step length - left;Shuffle;Trunk flexed Gait velocity: decr Gait velocity interpretation: Below normal speed for age/gender General Gait Details: cues for sequence, posture and position from [employer]            Wheelchair Mobility    Modified Rankin (Stroke Patients Only)       Balance Overall balance assessment: Needs assistance Sitting-balance support: No upper extremity supported;Feet supported Sitting  balance-Leahy Scale: Good     Standing balance support: Bilateral upper extremity supported Standing balance-Leahy Scale: Poor                               Pertinent Vitals/Pain Pain Assessment: 0-10 Pain Score: 6  Pain Location: L knee Pain Descriptors / Indicators: Aching;Sore Pain Intervention(s): Limited activity within patient's tolerance;Monitored during session;Premedicated before session;Ice applied;RN gave pain meds during session    [place] expects to be discharged to:: Private residence Living Arrangements: Spouse/significant other Available Help at Discharge: Family Type of Home: House Home Access: Level entry     [place]: None      Prior Function Level of Independence: Independent               Hand Dominance        Extremity/Trunk Assessment   Upper Extremity Assessment Upper Extremity Assessment: Overall WFL for tasks assessed    Lower Extremity Assessment Lower Extremity Assessment: LLE deficits/detail    Cervical / Trunk Assessment Cervical / Trunk Assessment: Normal  Communication   Communication: No difficulties  Cognition Arousal/Alertness: Awake/alert Behavior During Therapy: WFL for tasks assessed/performed Overall Cognitive Status: Within Functional Limits for tasks assessed                                        General Comments      Exercises Total Joint Exercises Ankle Circles/Pumps: AROM;Both;15 reps;Supine   Assessment/Plan  PT Assessment Patient needs continued PT services  PT Problem List Decreased strength;Decreased range of motion;Decreased activity tolerance;Decreased mobility;Decreased balance;Decreased knowledge of use of DME;Pain       PT Treatment Interventions DME instruction;Gait training;Stair training;Functional mobility training;Therapeutic activities;Therapeutic exercise;Patient/family education    PT Goals (Current  goals can be found in the Care Plan section)  Acute Rehab PT Goals Patient Stated Goal: Regain IND  PT Goal Formulation: With patient Time For Goal Achievement: 04/04/17 Potential to Achieve Goals: Good    Frequency 7X/week   Barriers to discharge        Co-evaluation               AM-PAC PT "6 Clicks" Daily Activity  Outcome Measure Difficulty turning over in bed (including adjusting bedclothes, sheets and blankets)?: Unable Difficulty moving from lying on back to sitting on the side of the bed? : Unable Difficulty sitting down on and standing up from a chair with arms (e.g., wheelchair, bedside commode, etc,.)?: Unable Help needed moving to and from a bed to chair (including a wheelchair)?: A Little Help needed walking in hospital room?: A Little Help needed climbing 3-5 steps with a railing? : A Lot 6 Click Score: 11    End of Session Equipment Utilized During Treatment: Gait belt;Left knee immobilizer Activity Tolerance: Patient tolerated treatment well;Patient limited by fatigue;Patient limited by pain Patient left: in chair;with call bell/phone within reach;with family/visitor present Nurse Communication: Mobility status PT Visit Diagnosis: Difficulty in walking, not elsewhere classified (R26.2)    Time: 5625-6389 PT Time Calculation (min) (ACUTE ONLY): 33 min   Charges:   PT Evaluation $PT Eval Low Complexity: 1 Low PT Treatments $Gait Training: 8-22 mins   PT G Codes:        Pg [phone]   Chace Klippel 03/28/2017, 7:17 PM

## 2017-03-28 NOTE — H&P (Signed)
TOTAL KNEE ADMISSION H&P  Patient is being admitted for left total knee arthroplasty.  Subjective:  Chief Complaint:left knee pain.  HPI: Carlos Juarez, 76 y.o. male, has a history of pain and functional disability in the left knee due to arthritis and has failed non-surgical conservative treatments for greater than 12 weeks to includeNSAID's and/or analgesics, corticosteriod injections, viscosupplementation injections, flexibility and strengthening excercises, supervised PT with diminished ADL's post treatment, use of assistive devices, weight reduction as appropriate and activity modification.  Onset of symptoms was gradual, starting 4 years ago with stable course since that time. The patient noted no past surgery on the left knee(s).  Patient currently rates pain in the left knee(s) at 10 out of 10 with activity. Patient has night pain, worsening of pain with activity and weight bearing, pain that interferes with activities of daily living, pain with passive range of motion, crepitus and joint swelling.  Patient has evidence of subchondral sclerosis, periarticular osteophytes and joint space narrowing by imaging studies. There is no active infection.  Patient Active Problem List   Diagnosis Date Noted  . Status post total left knee replacement 03/28/2017  . Unilateral primary osteoarthritis, left knee 02/19/2017  . Chronic pain of left knee 02/19/2017  . S/P lumbar laminectomy 12/25/2016  . 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 HTN (hypertension)   . Hypercholesteremia   . Hypothyroidism   . PVC's (premature ventricular contractions)    Past Medical History:  Diagnosis Date  . Arthritis   . 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 HTN (hypertension)   . Hypercholesteremia   . Hypothyroidism   . Pneumonia   . PVC's (premature ventricular contractions)   . Synovial cyst     Past Surgical History:  Procedure Laterality Date  . CARPAL TUNNEL RELEASE    .  HERNIA REPAIR     as a child r inguinal  . JOINT REPLACEMENT     TKA Dr. [name] 03/28/17  . LUMBAR LAMINECTOMY/DECOMPRESSION MICRODISCECTOMY N/A 12/25/2016   Procedure: Laminectomy and Foraminotomy - Lumbar two-Lumbar three - Lumbar three-Lumbar four, resection of synovial cyst;  Surgeon: Eustace Moore, MD;  Location: [facility];  Service: Neurosurgery;  Laterality: N/A;  . MENISCUS REPAIR Left   . synovial cyst removed     12/25/16  . TONSILLECTOMY      Current Facility-Administered Medications  Medication Dose Route Frequency Provider Last Rate Last Dose  . ceFAZolin (ANCEF) 2-4 GM/100ML-% IVPB           . ceFAZolin (ANCEF) IVPB 2g/100 mL premix  2 g Intravenous On Call to OR Pete Pelt, PA-C      . chlorhexidine (HIBICLENS) 4 % liquid 4 application  60 mL Topical Once Erskine Emery W, PA-C      . fentaNYL (SUBLIMAZE) 100 MCG/2ML injection           . midazolam (VERSED) 2 MG/2ML injection           . tranexamic acid (CYKLOKAPRON) 1,000 mg in sodium chloride 0.9 % 100 mL IVPB  1,000 mg Intravenous To OR Pete Pelt, PA-C       Allergies  Allergen Reactions  . Epinephrine Other (See Comments)    Light headedness    Social History   Tobacco Use  . Smoking status: Former Smoker    Packs/day: 1.00    Years: 15.00    Pack years: 15.00    Last attempt to quit: 07/15/1974  Years since quitting: 42.7  . Smokeless tobacco: Never Used  . Tobacco comment: smoked heavy 15 years and then quit  Substance Use Topics  . Alcohol use: Yes    Alcohol/week: 0.0 oz    Comment: Wine with meals at times     Family History  Problem Relation Age of Onset  . Hypertension Mother   . Heart disease Father   . Heart attack Father 32     Review of Systems  Musculoskeletal: Positive for joint pain.  All other systems reviewed and are negative.   Objective:  Physical Exam  Constitutional: He is oriented to person, place, and time. He appears well-developed and well-nourished.  HENT:   Head: Normocephalic and atraumatic.  Eyes: EOM are normal. Pupils are equal, round, and reactive to light.  Neck: Normal range of motion. Neck supple.  Cardiovascular: Normal rate and regular rhythm.  Respiratory: Effort normal and breath sounds normal.  GI: Soft. Bowel sounds are normal.  Musculoskeletal:       Left knee: He exhibits decreased range of motion, swelling and abnormal alignment. Tenderness found. Medial joint line and lateral joint line tenderness noted.  Neurological: He is alert and oriented to person, place, and time.  Skin: Skin is warm and dry.  Psychiatric: He has a normal mood and affect.    Vital signs in last 24 hours:    Labs:   Estimated body mass index is 29.57 kg/m as calculated from the following:   Height as of 03/25/17: 5\' 11"  (1.803 m).   Weight as of 03/25/17: 212 lb (96.2 kg).   Imaging Review Plain radiographs demonstrate severe degenerative joint disease of the left knee(s). The overall alignment ismild varus. The bone quality appears to be good for age and reported activity level.  Assessment/Plan:  End stage arthritis, left knee   The patient history, physical examination, clinical judgment of the provider and imaging studies are consistent with end stage degenerative joint disease of the left knee(s) and total knee arthroplasty is deemed medically necessary. The treatment options including medical management, injection therapy arthroscopy and arthroplasty were discussed at length. The risks and benefits of total knee arthroplasty were presented and reviewed. The risks due to aseptic loosening, infection, stiffness, patella tracking problems, thromboembolic complications and other imponderables were discussed. The patient acknowledged the explanation, agreed to proceed with the plan and consent was signed. Patient is being admitted for inpatient treatment for surgery, pain control, PT, OT, prophylactic antibiotics, VTE prophylaxis, progressive  ambulation and ADL's and discharge planning. The patient is planning to be discharged home with home health services

## 2017-03-28 NOTE — Progress Notes (Signed)
Assisted Dr. Carswell Jackson with left, ultrasound guided, adductor canal block. Side rails up, monitors on throughout procedure. See vital signs in flow sheet. Tolerated Procedure well.  

## 2017-03-28 NOTE — Anesthesia Procedure Notes (Signed)
Anesthesia Regional Block: Adductor canal block   Pre-Anesthetic Checklist: ,, timeout performed, Correct Patient, Correct Site, Correct Laterality, Correct Procedure, Correct Position, site marked, Risks and benefits discussed,  Surgical consent,  Pre-op evaluation,  At surgeon's request and post-op pain management  Laterality: Left and Lower  Prep: chloraprep       Needles:  Injection technique: Single-shot  Needle Type: Echogenic Needle     Needle Length: 9cm  Needle Gauge: 21     Additional Needles:   Procedures:,,,, ultrasound used (permanent image in chart),,,,  Narrative:  Start time: 03/28/2017 10:14 AM End time: 03/28/2017 10:20 AM Injection made incrementally with aspirations every 5 mL.  Performed by: Personally  Anesthesiologist: Annye Asa, MD  Additional Notes: Pt identified in Holding room.  Monitors applied. Working IV access confirmed. Sterile prep, drape L thigh.  #21ga ECHOgenic needle into adductor canal with US guidance.  20cc 0.75% Ropivacaine injected incrementally after negative test dose.  Patient asymptomatic, VSS, no heme aspirated, tolerated well.  Jenita Seashore, MD

## 2017-03-28 NOTE — Anesthesia Procedure Notes (Signed)
Spinal  Patient location during procedure: OR Start time: 03/28/2017 11:02 AM End time: 03/28/2017 11:06 AM Staffing Resident/CRNA: Sharlette Dense, CRNA Performed: resident/CRNA  Preanesthetic Checklist Completed: patient identified, site marked, surgical consent, pre-op evaluation, timeout performed, IV checked, risks and benefits discussed and monitors and equipment checked Spinal Block Patient position: sitting Prep: Betadine Approach: left paramedian Location: L3-4 Needle Needle type: Sprotte  Needle gauge: 24 G Needle length: 9 cm Additional Notes Kit expiration date 10/13/2018 and lot #[id number] Clear CSF free flowing, negative heme, negative paresthesia Returned to supine position and tolerated well

## 2017-03-28 NOTE — Anesthesia Preprocedure Evaluation (Signed)
Anesthesia Evaluation  Patient identified by MRN, date of birth, ID band Patient awake    Reviewed: Allergy & Precautions, NPO status , Patient's Chart, lab work & pertinent test results, reviewed documented beta blocker date and time   History of Anesthesia Complications Negative for: history of anesthetic complications  Airway Mallampati: II  TM Distance: >3 FB Neck ROM: Full    Dental  (+) Dental Advisory Given   Pulmonary former smoker,    breath sounds clear to auscultation       Cardiovascular hypertension, Pt. on medications and Pt. on home beta blockers (-) angina Rhythm:Regular Rate:Normal     Neuro/Psych negative neurological ROS     GI/Hepatic negative GI ROS, Neg liver ROS,   Endo/Other  Hypothyroidism   Renal/GU negative Renal ROS     Musculoskeletal  (+) Arthritis , Osteoarthritis,    Abdominal   Peds  Hematology negative hematology ROS (+)   Anesthesia Other Findings   Reproductive/Obstetrics                             Anesthesia Physical Anesthesia Plan  ASA: II  Anesthesia Plan: Spinal   Post-op Pain Management:  Regional for Post-op pain   Induction:   PONV Risk Score and Plan: 1 and Ondansetron  Airway Management Planned: Natural Airway and Nasal Cannula  Additional Equipment:   Intra-op Plan:   Post-operative Plan:   Informed Consent: I have reviewed the patients History and Physical, chart, labs and discussed the procedure including the risks, benefits and alternatives for the proposed anesthesia with the patient or authorized representative who has indicated his/her understanding and acceptance.   Dental advisory given  Plan Discussed with: CRNA and Surgeon  Anesthesia Plan Comments: (Plan routine monitors, SAB with adductor canal block for post op analgesia)        Anesthesia Quick Evaluation

## 2017-03-28 NOTE — Anesthesia Postprocedure Evaluation (Signed)
Anesthesia Post Note  Patient: Carlos Juarez  Procedure(s) Performed: LEFT TOTAL KNEE ARTHROPLASTY (Left )     Patient location during evaluation: PACU Anesthesia Type: Spinal and Regional Level of consciousness: awake and alert, oriented and patient cooperative Pain management: pain level controlled Vital Signs Assessment: post-procedure vital signs reviewed and stable Respiratory status: spontaneous breathing, nonlabored ventilation and respiratory function stable Cardiovascular status: stable and blood pressure returned to baseline Postop Assessment: patient able to bend at knees, no apparent nausea or vomiting and spinal receding Anesthetic complications: no    Last Vitals:  Vitals:   03/28/17 1508 03/28/17 1521  BP: 100/88 118/74  Pulse: 62   Resp: 17 16  Temp:  36.5 C  SpO2: 100% 99%    Last Pain:  Vitals:   03/28/17 1550  TempSrc:   PainSc: 5                  Tyleek Smick,E. Jovannie Ulibarri

## 2017-03-28 NOTE — Transfer of Care (Signed)
Immediate Anesthesia Transfer of Care Note  Patient: Carlos Juarez  Procedure(s) Performed: LEFT TOTAL KNEE ARTHROPLASTY (Left )  Patient Location: PACU  Anesthesia Type:Spinal  Level of Consciousness: awake, alert  and oriented  Airway & Oxygen Therapy: Patient Spontanous Breathing and Patient connected to face mask oxygen  Post-op Assessment: Report given to RN and Post -op Vital signs reviewed and stable  Post vital signs: Reviewed and stable  Last Vitals:  Vitals:   03/28/17 1050 03/28/17 1051  BP: 120/73   Pulse: (!) 51 (!) 52  Resp: 13 13  Temp:    SpO2: 100% 100%    Last Pain:  Vitals:   03/28/17 0934  TempSrc: Oral      Patients Stated Pain Goal: 4 (69/45/03 8882)  Complications: No apparent anesthesia complications

## 2017-03-29 LAB — CBC
HCT: 43.6 % (ref 39.0–52.0)
Hemoglobin: 14.9 g/dL (ref 13.0–17.0)
MCH: 31.6 pg (ref 26.0–34.0)
MCHC: 34.2 g/dL (ref 30.0–36.0)
MCV: 92.6 fL (ref 78.0–100.0)
PLATELETS: 201 10*3/uL (ref 150–400)
RBC: 4.71 MIL/uL (ref 4.22–5.81)
RDW: 12.4 % (ref 11.5–15.5)
WBC: 13.4 10*3/uL — AB (ref 4.0–10.5)

## 2017-03-29 LAB — BASIC METABOLIC PANEL
ANION GAP: 6 (ref 5–15)
BUN: 17 mg/dL (ref 6–20)
CO2: 30 mmol/L (ref 22–32)
Calcium: 8.4 mg/dL — ABNORMAL LOW (ref 8.9–10.3)
Chloride: 103 mmol/L (ref 101–111)
Creatinine, Ser: 1.05 mg/dL (ref 0.61–1.24)
GFR calc Af Amer: >60 mL/min (ref >60)
Glucose, Bld: 141 mg/dL — ABNORMAL HIGH (ref 65–99)
POTASSIUM: 3.8 mmol/L (ref 3.5–5.1)
SODIUM: 139 mmol/L (ref 135–145)

## 2017-03-29 MED ORDER — METHOCARBAMOL 500 MG PO TABS: 500.0000 mg | ORAL_TABLET | Freq: Four times a day (QID) | ORAL | 0 refills | Status: DC | PRN

## 2017-03-29 MED ORDER — ASPIRIN 325 MG PO TBEC: 325.0000 mg | DELAYED_RELEASE_TABLET | Freq: Two times a day (BID) | ORAL | 0 refills | Status: DC

## 2017-03-29 MED ORDER — OXYCODONE-ACETAMINOPHEN 5-325 MG PO TABS: 1.0000 | ORAL_TABLET | ORAL | 0 refills | Status: DC | PRN

## 2017-03-29 MED ORDER — SODIUM CHLORIDE 0.9 % IV BOLUS (SEPSIS)
250.0000 mL | Freq: Once | INTRAVENOUS | Status: AC
  Administered 2017-03-29: 10:00:00 250 mL via INTRAVENOUS

## 2017-03-29 NOTE — Evaluation (Signed)
Occupational Therapy Evaluation Patient Details Name: Carlos Juarez MRN: [id number] DOB: 01-Nov-1940 Today's Date: 03/29/2017    History of Present Illness Pt s/p L TKR and with hx of lumbar lami 9/18   Clinical Impression   This 76 year old man was admitted for the above sx.  He will benefit from one more session of OT to increase safety and independence with bathroom transfers. Goals are for min guard level    Follow Up Recommendations  Supervision/Assistance - 24 hour    Equipment Recommendations  3 in 1 bedside commode    Recommendations for Other Services       Precautions / Restrictions Precautions Precautions: Knee;Fall Required Braces or Orthoses: Knee Immobilizer - Left Restrictions Other Position/Activity Restrictions: WBAT      Mobility Bed Mobility               General bed mobility comments: sit to supine:  min A for LLE  Transfers   Equipment used: Rolling walker (2 wheeled)   Sit to Stand: Min assist         General transfer comment: cues for LE management and use of UEs to self assist    Balance                                           ADL either performed or assessed with clinical judgement   ADL Overall ADL's : Needs assistance/impaired Eating/Feeding: Independent   Grooming: Standing;Supervision/safety   Upper Body Bathing: Set up;Sitting   Lower Body Bathing: Minimal assistance;Sit to/from stand   Upper Body Dressing : Set up;Sitting   Lower Body Dressing: Maximal assistance;Sit to/from stand   Toilet Transfer: Minimal assistance;Ambulation;Comfort height toilet;BSC;Grab bars;RW   Toileting- Clothing Manipulation and Hygiene: Minimal assistance;Sit to/from stand         General ADL Comments: pt would benefit from 3:1 over his higher commode.  Ambulated to bathroom, stood for urinal and transferred to comfort height commode. Wife will assist with adls. as needed.  Reviewed precautions     Vision          Perception     Praxis      Pertinent Vitals/Pain Pain Score: 5  Pain Location: L knee Pain Descriptors / Indicators: Aching;Sore Pain Intervention(s): Limited activity within patient's tolerance;Monitored during session;Premedicated before session;Repositioned     Hand Dominance     Extremity/Trunk Assessment Upper Extremity Assessment Upper Extremity Assessment: Overall WFL for tasks assessed           Communication Communication Communication: No difficulties   Cognition Arousal/Alertness: Awake/alert Behavior During Therapy: WFL for tasks assessed/performed Overall Cognitive Status: Within Functional Limits for tasks assessed                                     General Comments       Exercises     Shoulder Instructions      Home Living Family/patient expects to be discharged to:: Private residence Living Arrangements: Spouse/significant other Available Help at Discharge: Family               Bathroom Shower/Tub: Walk-in [employer] treasurer Toilet: Handicapped height     Home Equipment: None          Prior Functioning/Environment Level of Independence: Independent  OT Problem List: Decreased strength;Decreased activity tolerance;Pain;Decreased knowledge of use of DME or AE;Decreased knowledge of precautions      OT Treatment/Interventions: Self-care/ADL training;DME and/or AE instruction;Patient/family education    OT Goals(Current goals can be found in the care plan section) Acute Rehab OT Goals Patient Stated Goal: Regain IND  OT Goal Formulation: With patient Time For Goal Achievement: 04/05/17 Potential to Achieve Goals: Good ADL Goals Pt Will Transfer to Toilet: with min guard assist;ambulating;bedside commode Pt Will Perform Tub/Shower Transfer: Shower transfer;ambulating;with min guard assist  OT Frequency: Min 2X/week   Barriers to D/C:            Co-evaluation               AM-PAC PT "6 Clicks" Daily Activity     Outcome Measure Help from another person eating meals?: None Help from another person taking care of personal grooming?: A Little Help from another person toileting, which includes using toliet, bedpan, or urinal?: A Little Help from another person bathing (including washing, rinsing, drying)?: A Lot Help from another person to put on and taking off regular upper body clothing?: A Little Help from another person to put on and taking off regular lower body clothing?: A Lot 6 Click Score: 17   End of Session    Activity Tolerance: Patient tolerated treatment well Patient left: in bed;with call bell/phone within reach  OT Visit Diagnosis: Pain Pain - Right/Left: Left Pain - part of body: Knee                Time: 0762-2633 OT Time Calculation (min): 22 min Charges:  OT General Charges $OT Visit: 1 Visit OT Evaluation $OT Eval Low Complexity: 1 Low G-Codes:     Lesle Chris, OTR/L [phone] 03/29/2017  Carlos Juarez 03/29/2017, 12:33 PM

## 2017-03-29 NOTE — Progress Notes (Signed)
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  Pt hopeful for dc home Monday   Follow Up Recommendations  Home health PT     Equipment Recommendations  Rolling walker with 5" wheels    Recommendations for Other Services OT consult     Precautions / Restrictions Precautions Precautions: Knee;Fall Required Braces or Orthoses: Knee Immobilizer - Left Restrictions Weight Bearing Restrictions: No Other Position/Activity Restrictions: WBAT    Mobility  Bed Mobility Overal bed mobility: Needs Assistance Bed Mobility: Supine to Sit;Sit to Supine     Supine to sit: Min assist Sit to supine: Min assist   General bed mobility comments: cues for sequence and use of R LE to self assist  Transfers Overall transfer level: Needs assistance Equipment used: Rolling walker (2 wheeled) Transfers: Sit to/from Stand Sit to Stand: Min assist         General transfer comment: cues for LE management and use of UEs to self assist  Ambulation/Gait Ambulation/Gait assistance: Min assist Ambulation Distance (Feet): 75 Feet Assistive device: Rolling walker (2 wheeled) Gait Pattern/deviations: Step-to pattern;Decreased step length - right;Decreased step length - left;Shuffle;Trunk flexed Gait velocity: decr Gait velocity interpretation: Below normal speed for age/gender General Gait Details: cues for sequence, posture and position from [employer]            Wheelchair Mobility    Modified Rankin (Stroke Patients Only)       Balance Overall balance assessment: Needs assistance Sitting-balance support: No upper extremity supported;Feet supported Sitting balance-Leahy Scale: Good     Standing balance support: Single extremity supported Standing balance-Leahy Scale:  Poor                              Cognition Arousal/Alertness: Awake/alert Behavior During Therapy: WFL for tasks assessed/performed Overall Cognitive Status: Within Functional Limits for tasks assessed                                        Exercises      General Comments        Pertinent Vitals/Pain Pain Assessment: 0-10 Pain Score: 5  Pain Location: L knee Pain Descriptors / Indicators: Aching;Sore Pain Intervention(s): Limited activity within patient's tolerance;Premedicated before session;Monitored during session;Ice applied    Home Living                      Prior Function            PT Goals (current goals can now be found in the care plan section) Acute Rehab PT Goals Patient Stated Goal: Regain IND  PT Goal Formulation: With patient Time For Goal Achievement: 04/04/17 Potential to Achieve Goals: Good Progress towards PT goals: Progressing toward goals    Frequency    7X/week      PT Plan Current plan remains appropriate    Co-evaluation              AM-PAC PT "6 Clicks" Daily Activity  Outcome Measure  Difficulty turning over in bed (including adjusting bedclothes, sheets and blankets)?: Unable Difficulty moving from lying on back to sitting  on the side of the bed? : Unable Difficulty sitting down on and standing up from a chair with arms (e.g., wheelchair, bedside commode, etc,.)?: Unable Help needed moving to and from a bed to chair (including a wheelchair)?: A Little Help needed walking in hospital room?: A Little Help needed climbing 3-5 steps with a railing? : A Lot 6 Click Score: 11    End of Session Equipment Utilized During Treatment: Gait belt;Left knee immobilizer Activity Tolerance: Patient tolerated treatment well Patient left: in bed;with call bell/phone within reach;with family/visitor present Nurse Communication: Mobility status PT Visit Diagnosis: Difficulty in walking, not elsewhere  classified (R26.2)     Time: 1157-2620 PT Time Calculation (min) (ACUTE ONLY): 33 min  Charges:  $Gait Training: 23-37 mins                    G Codes:       Pg [phone]    Jamilyn Pigeon 03/29/2017, 5:01 PM

## 2017-03-29 NOTE — Progress Notes (Signed)
Physical Therapy Treatment Patient Details Name: Carlos Juarez MRN: [id number] DOB: [birth year] Today's Date: 03/29/2017    History of Present Illness Pt s/p L TKR and with hx of lumbar lami 9/18    PT Comments    Pt very motivated to progress but ltd this am by drop in BP with OOB activity - 77/48  - RN aware   Follow Up Recommendations  Home health PT     Equipment Recommendations  Rolling walker with 5" wheels    Recommendations for Other Services OT consult     Precautions / Restrictions Precautions Precautions: Knee;Fall Required Braces or Orthoses: Knee Immobilizer - Left Restrictions Weight Bearing Restrictions: No Other Position/Activity Restrictions: WBAT    Mobility  Bed Mobility Overal bed mobility: Needs Assistance Bed Mobility: Supine to Sit     Supine to sit: Min assist     General bed mobility comments: cues for sequence and use of R LE to self assist  Transfers Overall transfer level: Needs assistance Equipment used: Rolling walker (2 wheeled) Transfers: Sit to/from Stand Sit to Stand: Min assist         General transfer comment: cues for LE management and use of UEs to self assist  Ambulation/Gait Ambulation/Gait assistance: Min assist Ambulation Distance (Feet): 38 Feet Assistive device: Rolling walker (2 wheeled) Gait Pattern/deviations: Step-to pattern;Decreased step length - right;Decreased step length - left;Shuffle;Trunk flexed Gait velocity: decr Gait velocity interpretation: Below normal speed for age/gender General Gait Details: cues for sequence, posture and position from [employer]            Wheelchair Mobility    Modified Rankin (Stroke Patients Only)       Balance   Sitting-balance support: No upper extremity supported;Feet supported Sitting balance-Leahy Scale: Good     Standing balance support: Bilateral upper extremity supported Standing balance-Leahy Scale: Poor                              Cognition Arousal/Alertness: Awake/alert Behavior During Therapy: WFL for tasks assessed/performed Overall Cognitive Status: Within Functional Limits for tasks assessed                                        Exercises Total Joint Exercises Ankle Circles/Pumps: AROM;Both;15 reps;Supine Quad Sets: AROM;Both;10 reps;Supine Heel Slides: AAROM;Left;15 reps;Supine Straight Leg Raises: AAROM;Left;10 reps;Supine Goniometric ROM: AAROM L knee -10 - 50    General Comments        Pertinent Vitals/Pain Pain Assessment: 0-10 Pain Score: 5  Pain Location: L knee Pain Descriptors / Indicators: Aching;Sore Pain Intervention(s): Limited activity within patient's tolerance;Monitored during session;Premedicated before session;Ice applied    Home Living Family/patient expects to be discharged to:: Private residence Living Arrangements: Spouse/significant other Available Help at Discharge: Family         Home Equipment: None      Prior Function Level of Independence: Independent          PT Goals (current goals can now be found in the care plan section) Acute Rehab PT Goals Patient Stated Goal: Regain IND  PT Goal Formulation: With patient Time For Goal Achievement: 04/04/17 Potential to Achieve Goals: Good Progress towards PT goals: Progressing toward goals    Frequency    7X/week      PT Plan Current plan remains appropriate    Co-evaluation  AM-PAC PT "6 Clicks" Daily Activity  Outcome Measure  Difficulty turning over in bed (including adjusting bedclothes, sheets and blankets)?: Unable Difficulty moving from lying on back to sitting on the side of the bed? : Unable Difficulty sitting down on and standing up from a chair with arms (e.g., wheelchair, bedside commode, etc,.)?: Unable Help needed moving to and from a bed to chair (including a wheelchair)?: A Little Help needed walking in hospital room?: A Little Help needed  climbing 3-5 steps with a railing? : A Lot 6 Click Score: 11    End of Session Equipment Utilized During Treatment: Gait belt;Left knee immobilizer Activity Tolerance: Patient tolerated treatment well;Patient limited by fatigue;Patient limited by pain Patient left: in chair;with call bell/phone within reach;with family/visitor present Nurse Communication: Mobility status;Other (comment)(BP) PT Visit Diagnosis: Difficulty in walking, not elsewhere classified (R26.2)     Time: 1448-1856 PT Time Calculation (min) (ACUTE ONLY): 34 min  Charges:  $Gait Training: 8-22 mins $Therapeutic Exercise: 8-22 mins                    G Codes:       Pg [phone]    Muhammadali Ries 03/29/2017, 12:39 PM

## 2017-03-29 NOTE — Discharge Instructions (Signed)

## 2017-03-29 NOTE — Progress Notes (Signed)
   Subjective: 1 Day Post-Op Procedure(s) (LRB): LEFT TOTAL KNEE ARTHROPLASTY (Left) Patient reports pain as moderate and severe.  Had some hypotension after therapy and got 250cc fluid bolus, BP back to normal .   Objective: Vital signs in last 24 hours: Temp:  [97.4 F (36.3 C)-98.2 F (36.8 C)] 97.8 F (36.6 C) (12/15 1023) Pulse Rate:  [52-76] 64 (12/15 1023) Resp:  [12-18] 17 (12/15 1023) BP: (90-140)/(56-88) 129/74 (12/15 1023) SpO2:  [95 %-100 %] 96 % (12/15 1023) Weight:  [212 lb (96.2 kg)] 212 lb (96.2 kg) (12/14 1521)  Intake/Output from previous day: 12/14 0701 - 12/15 0700 In: 3425 [P.O.:640; I.V.:2525; IV Piggyback:260] Out: 1900 [Urine:1850; Blood:50] Intake/Output this shift: Total I/O In: 240 [P.O.:240] Out: 100 [Urine:100]  Recent Labs    03/29/17 0503  HGB 14.9   Recent Labs    03/29/17 0503  WBC 13.4*  RBC 4.71  HCT 43.6  PLT 201   Recent Labs    03/29/17 0503  NA 139  K 3.8  CL 103  CO2 30  BUN 17  CREATININE 1.05  GLUCOSE 141*  CALCIUM 8.4*   No results for input(s): LABPT, INR in the last 72 hours.  Neurologically intact, dressing dry.  Dg Knee Left Port  Result Date: 03/28/2017 CLINICAL DATA:  Status post left knee replacement. EXAM: PORTABLE LEFT KNEE - 1-2 VIEW COMPARISON:  MRI of the knee 02/22/2013, knee radiograph 02/05/2017 FINDINGS: Post total left knee arthroplasty with normal alignment of the orthopedic hardware and no evidence of fracture. Expected postsurgical soft tissue edema and emphysema. IMPRESSION: Post recent total left knee arthroplasty without evidence of immediate complications. Electronically Signed   By: Fidela Salisbury M.D.   On: 03/28/2017 13:23    Assessment/Plan: 1 Day Post-Op Procedure(s) (LRB): LEFT TOTAL KNEE ARTHROPLASTY (Left) Up with therapy, check CBC in AM  Marybelle Killings 03/29/2017, 12:15 PM

## 2017-03-30 LAB — CBC
HCT: 40 % (ref 39.0–52.0)
HEMOGLOBIN: 14.3 g/dL (ref 13.0–17.0)
MCH: 32.4 pg (ref 26.0–34.0)
MCHC: 35.8 g/dL (ref 30.0–36.0)
MCV: 90.7 fL (ref 78.0–100.0)
Platelets: 179 10*3/uL (ref 150–400)
RBC: 4.41 MIL/uL (ref 4.22–5.81)
RDW: 12.3 % (ref 11.5–15.5)
WBC: 12.2 10*3/uL — ABNORMAL HIGH (ref 4.0–10.5)

## 2017-03-30 LAB — URINALYSIS, ROUTINE W REFLEX MICROSCOPIC
Bilirubin Urine: NEGATIVE
Glucose, UA: NEGATIVE mg/dL
Ketones, ur: NEGATIVE mg/dL
Leukocytes, UA: NEGATIVE
Nitrite: NEGATIVE
PROTEIN: NEGATIVE mg/dL
SQUAMOUS EPITHELIAL / LPF: NONE SEEN
Specific Gravity, Urine: 1.004 — ABNORMAL LOW (ref 1.005–1.030)
pH: 6 (ref 5.0–8.0)

## 2017-03-30 NOTE — Progress Notes (Signed)
Physical Therapy Treatment Patient Details Name: Carlos Juarez MRN: [id number] DOB: 30-Aug-1940 Today's Date: 03/30/2017    History of Present Illness Pt s/p L TKR and with hx of lumbar lami 9/18    PT Comments    Pt motivated and progressing steadily with mobility.  Pt performed IND SLR this am and ambulates sans KI.   Follow Up Recommendations  Home health PT     Equipment Recommendations  Rolling walker with 5" wheels    Recommendations for Other Services OT consult     Precautions / Restrictions Precautions Precautions: Knee;Fall Required Braces or Orthoses: Knee Immobilizer - Left Knee Immobilizer - Left: Discontinue once straight leg raise with < 10 degree lag(Pt performed IND SLR this am) Restrictions Weight Bearing Restrictions: No Other Position/Activity Restrictions: WBAT    Mobility  Bed Mobility               General bed mobility comments: Pt OOB and requests back to chair  Transfers Overall transfer level: Needs assistance Equipment used: Rolling walker (2 wheeled) Transfers: Sit to/from Stand Sit to Stand: Min guard         General transfer comment: for safety. Cues to extend leg when sitting  Ambulation/Gait Ambulation/Gait assistance: Min assist;Min guard Ambulation Distance (Feet): 123 Feet Assistive device: Rolling walker (2 wheeled) Gait Pattern/deviations: Decreased step length - right;Decreased step length - left;Shuffle;Trunk flexed;Step-to pattern;Step-through pattern Gait velocity: decr Gait velocity interpretation: Below normal speed for age/gender General Gait Details: cues for sequence, posture and position from [employer]            Wheelchair Mobility    Modified Rankin (Stroke Patients Only)       Balance Overall balance assessment: Needs assistance Sitting-balance support: No upper extremity supported;Feet supported Sitting balance-Leahy Scale: Good     Standing balance support: No upper extremity  supported Standing balance-Leahy Scale: Fair                              Cognition Arousal/Alertness: Awake/alert Behavior During Therapy: WFL for tasks assessed/performed Overall Cognitive Status: Within Functional Limits for tasks assessed                                        Exercises Total Joint Exercises Ankle Circles/Pumps: AROM;Both;15 reps;Supine Quad Sets: AROM;Both;Supine;15 reps Heel Slides: AAROM;Left;15 reps;Supine Straight Leg Raises: AAROM;Left;Supine;20 reps Goniometric ROM: AAROM L knee -10 - 50    General Comments        Pertinent Vitals/Pain Pain Assessment: 0-10 Pain Score: 5  Pain Location: L knee Pain Descriptors / Indicators: Aching;Sore Pain Intervention(s): Limited activity within patient's tolerance;Monitored during session;Premedicated before session;Ice applied    Home Living                      Prior Function            PT Goals (current goals can now be found in the care plan section) Acute Rehab PT Goals Patient Stated Goal: Regain IND  PT Goal Formulation: With patient Time For Goal Achievement: 04/04/17 Potential to Achieve Goals: Good Progress towards PT goals: Progressing toward goals    Frequency    7X/week      PT Plan Current plan remains appropriate    Co-evaluation  AM-PAC PT "6 Clicks" Daily Activity  Outcome Measure  Difficulty turning over in bed (including adjusting bedclothes, sheets and blankets)?: Unable Difficulty moving from lying on back to sitting on the side of the bed? : Unable Difficulty sitting down on and standing up from a chair with arms (e.g., wheelchair, bedside commode, etc,.)?: Unable Help needed moving to and from a bed to chair (including a wheelchair)?: A Little Help needed walking in hospital room?: A Little Help needed climbing 3-5 steps with a railing? : A Little 6 Click Score: 12    End of Session Equipment Utilized During  Treatment: Gait belt;Left knee immobilizer Activity Tolerance: Patient tolerated treatment well Patient left: in chair;with call bell/phone within reach;with family/visitor present Nurse Communication: Mobility status PT Visit Diagnosis: Difficulty in walking, not elsewhere classified (R26.2)     Time: 7939-6886 PT Time Calculation (min) (ACUTE ONLY): 32 min  Charges:                       G Codes:       Pg [phone]    Yatziri Wainwright 03/30/2017, 12:28 PM

## 2017-03-30 NOTE — Progress Notes (Signed)
   Subjective: 2 Days Post-Op Procedure(s) (LRB): LEFT TOTAL KNEE ARTHROPLASTY (Left) Patient reports pain as mild and moderate.  C/O dysuria  Objective: Vital signs in last 24 hours: Temp:  [97.6 F (36.4 C)-98.3 F (36.8 C)] 97.6 F (36.4 C) (12/16 0546) Pulse Rate:  [64-99] 99 (12/16 0546) Resp:  [16-17] 16 (12/16 0546) BP: (129-151)/(72-83) 151/83 (12/16 0546) SpO2:  [95 %-98 %] 97 % (12/16 0546)  Intake/Output from previous day: 12/15 0701 - 12/16 0700 In: 965 [P.O.:720; I.V.:190; IV Piggyback:55] Out: 900 [Urine:900] Intake/Output this shift: No intake/output data recorded.  Recent Labs    03/29/17 0503 03/30/17 0450  HGB 14.9 14.3   Recent Labs    03/29/17 0503 03/30/17 0450  WBC 13.4* 12.2*  RBC 4.71 4.41  HCT 43.6 40.0  PLT 201 179   Recent Labs    03/29/17 0503  NA 139  K 3.8  CL 103  CO2 30  BUN 17  CREATININE 1.05  GLUCOSE 141*  CALCIUM 8.4*   No results for input(s): LABPT, INR in the last 72 hours.  Neurologically intact No results found.  Assessment/Plan: 2 Days Post-Op Procedure(s) (LRB): LEFT TOTAL KNEE ARTHROPLASTY (Left) Up with therapy, check UA for dysuria, continue therapy , likely home Monday  Carlos Juarez 03/30/2017, 7:54 AM

## 2017-03-30 NOTE — Progress Notes (Signed)
Physical Therapy Treatment Patient Details Name: Carlos Juarez MRN: [id number] DOB: [birth year] Today's Date: 03/30/2017    History of Present Illness Pt s/p L TKR and with hx of lumbar lami 9/18    PT Comments    Pt continues cooperative but ltd this pm by fatigue and increasing pain.  Pt to/from bathroom, washed up at sink and assisted to bed.  Pain meds requested and ortho tech alerted to apply CPM following meds.   Follow Up Recommendations  Home health PT     Equipment Recommendations  Rolling walker with 5" wheels    Recommendations for Other Services OT consult     Precautions / Restrictions Precautions Precautions: Knee;Fall Required Braces or Orthoses: Knee Immobilizer - Left Knee Immobilizer - Left: Discontinue once straight leg raise with < 10 degree lag Restrictions Weight Bearing Restrictions: No Other Position/Activity Restrictions: WBAT    Mobility  Bed Mobility Overal bed mobility: Needs Assistance Bed Mobility: Sit to Supine       Sit to supine: Min guard   General bed mobility comments: cues for sequence  Transfers Overall transfer level: Needs assistance Equipment used: Rolling walker (2 wheeled) Transfers: Sit to/from Stand Sit to Stand: Min guard;Supervision         General transfer comment: for safety. Cues to extend leg when sitting  Ambulation/Gait Ambulation/Gait assistance: Min guard Ambulation Distance (Feet): 40 Feet Assistive device: Rolling walker (2 wheeled) Gait Pattern/deviations: Decreased step length - right;Decreased step length - left;Shuffle;Trunk flexed;Step-to pattern;Step-through pattern Gait velocity: decr Gait velocity interpretation: Below normal speed for age/gender General Gait Details: cues for sequence, posture and position from [employer]            Wheelchair Mobility    Modified Rankin (Stroke Patients Only)       Balance Overall balance assessment: Needs assistance Sitting-balance  support: No upper extremity supported;Feet supported Sitting balance-Leahy Scale: Good     Standing balance support: No upper extremity supported Standing balance-Leahy Scale: Fair                              Cognition Arousal/Alertness: Awake/alert Behavior During Therapy: WFL for tasks assessed/performed Overall Cognitive Status: Within Functional Limits for tasks assessed                                        Exercises Total Joint Exercises Ankle Circles/Pumps: AROM;Both;15 reps;Supine Quad Sets: AROM;Both;Supine;15 reps Heel Slides: AAROM;Left;15 reps;Supine Straight Leg Raises: AAROM;Left;Supine;20 reps Goniometric ROM: AAROM L knee -10 - 50    General Comments        Pertinent Vitals/Pain Pain Assessment: 0-10 Pain Score: 6  Pain Location: L knee Pain Descriptors / Indicators: Aching;Sore Pain Intervention(s): Limited activity within patient's tolerance;Monitored during session;Premedicated before session;Ice applied    Home Living                      Prior Function            PT Goals (current goals can now be found in the care plan section) Acute Rehab PT Goals Patient Stated Goal: Regain IND  PT Goal Formulation: With patient Time For Goal Achievement: 04/04/17 Potential to Achieve Goals: Good Progress towards PT goals: Progressing toward goals    Frequency    7X/week      PT  Plan Current plan remains appropriate    Co-evaluation              AM-PAC PT "6 Clicks" Daily Activity  Outcome Measure  Difficulty turning over in bed (including adjusting bedclothes, sheets and blankets)?: A Lot Difficulty moving from lying on back to sitting on the side of the bed? : A Lot Difficulty sitting down on and standing up from a chair with arms (e.g., wheelchair, bedside commode, etc,.)?: A Lot Help needed moving to and from a bed to chair (including a wheelchair)?: A Little Help needed walking in hospital  room?: A Little Help needed climbing 3-5 steps with a railing? : A Little 6 Click Score: 15    End of Session Equipment Utilized During Treatment: Gait belt Activity Tolerance: Patient tolerated treatment well Patient left: in bed;with call bell/phone within reach;with family/visitor present Nurse Communication: Mobility status PT Visit Diagnosis: Difficulty in walking, not elsewhere classified (R26.2)     Time: 1325-1340 PT Time Calculation (min) (ACUTE ONLY): 15 min  Charges:  $Gait Training: 8-22 mins                    G Codes:       Pg [phone]    Rithika Seel 03/30/2017, 1:55 PM

## 2017-03-30 NOTE — Progress Notes (Signed)
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 9/18   OT comments  All education completed this session  Follow Up Recommendations  Supervision/Assistance - 24 hour    Equipment Recommendations  3 in 1 bedside commode    Recommendations for Other Services      Precautions / Restrictions Precautions Precautions: Knee;Fall Required Braces or Orthoses: Knee Immobilizer - Left Knee Immobilizer - Left: Discontinue once straight leg raise with < 10 degree lag Restrictions Other Position/Activity Restrictions: WBAT       Mobility Bed Mobility               General bed mobility comments: oob  Transfers   Equipment used: Rolling walker (2 wheeled)   Sit to Stand: Min guard         General transfer comment: for safety. Cues to extend leg when sitting    Balance                                           ADL either performed or assessed with clinical judgement   ADL                           Toilet Transfer: Min guard;Ambulation;RW(chair)       Tub/ Shower Transfer: Min guard;Ambulation;3 in 1     General ADL Comments: practiced shower transfer (simulated ledge due to accessible shower) and gave him a handout. Pt ambulated around bed to look in drawers for his electric razor. Cues for safety/getting close to drawers     Vision       Perception     Praxis      Cognition Arousal/Alertness: Awake/alert Behavior During Therapy: [facility] for tasks assessed/performed Overall Cognitive Status: Within Functional Limits for tasks assessed                                          Exercises     Shoulder Instructions       General Comments      Pertinent Vitals/ Pain       Pain Score: 4  Pain Location: L knee Pain Descriptors / Indicators: Aching;Sore Pain  Intervention(s): Limited activity within patient's tolerance;Monitored during session;Premedicated before session;Repositioned;Ice applied  Home Living                                          Prior Functioning/Environment              Frequency           Progress Toward Goals  OT Goals(current goals can now be found in the care plan section)        Plan      Co-evaluation                 AM-PAC PT "6 Clicks" Daily Activity     Outcome Measure   Help from another person eating meals?: None Help from another person taking care of personal grooming?: A Little Help from another  person toileting, which includes using toliet, bedpan, or urinal?: A Little Help from another person bathing (including washing, rinsing, drying)?: A Lot Help from another person to put on and taking off regular upper body clothing?: A Little Help from another person to put on and taking off regular lower body clothing?: A Lot 6 Click Score: 17    End of Session    OT Visit Diagnosis: Pain Pain - Right/Left: Left Pain - part of body: Knee   Activity Tolerance Patient tolerated treatment well   Patient Left in chair;with call bell/phone within reach   Nurse Communication          Time: 0539-7673 OT Time Calculation (min): 17 min  Charges: OT General Charges $OT Visit: 1 Visit OT Treatments $Self Care/Home Management : 8-22 mins  Carlos Juarez, OTR/L [phone] 03/30/2017   Carlos Juarez 03/30/2017, 10:06 AM

## 2017-03-30 NOTE — Care Management Note (Signed)
Case Management Note  Patient Details  Name: Carlos Juarez MRN: [id number] Date of Birth: Nov 09, 1940  Subjective/Objective:    Left TKA                 Action/Plan: Discharge Planning: DME ordered with [facility]. [facility] will call to set up [place].   Expected Discharge Date:                  Expected Discharge Plan:  [place]  In-House Referral:  NA  Discharge planning Services  CM Consult  Post Acute Care Choice:  Home Health Choice offered to:  Patient  DME Arranged:  3-N-1, Walker rolling DME Agency:  [place]:  PT [place]:  [facility] ([facility])  Status of Service:  Completed, signed off  If discussed at [employer], dates discussed:    Additional Comments:  Erenest Rasher, RN 03/30/2017, 11:15 AM

## 2017-03-30 NOTE — Progress Notes (Addendum)
MD, patient reported burning with urination today. U/A/Urine culture? Carlos Juarez

## 2017-03-31 NOTE — Progress Notes (Signed)
Patient ID: Carlos Juarez, male   DOB: Nov 19, 1940, 76 y.o.   MRN: [id number] Doing well overall.  Feels better.  Left knee stable.  Can go home today.

## 2017-03-31 NOTE — Progress Notes (Signed)
Discharge planning, spoke with patient at bedside. Have chosen [facility] for [facility] PT. Contacted [facility] for referral. Needs RW and 3n1, contacted [facility] to deliver to room. [phone]

## 2017-03-31 NOTE — Op Note (Signed)
NAME:  Carlos Juarez, Carlos Juarez NO.:  MEDICAL RECORD NO.:  [id number]  LOCATION:                                 FACILITY:  PHYSICIAN:  Lind Guest. [name], M.D.DATE OF BIRTH:  DATE OF PROCEDURE:  03/28/2017 DATE OF DISCHARGE:                              OPERATIVE REPORT   PREOPERATIVE DIAGNOSES:  Primary osteoarthritis and degenerative joint disease, left knee.  POSTOPERATIVE DIAGNOSES:  Primary osteoarthritis and degenerative joint disease, left knee.  PROCEDURE:  Left total knee arthroplasty.  IMPLANTS:  Stryker Triathlon press-fit knee system with size 5 press-fit femur, size 5 press-fit tibial tray, 11 mm fix-bearing polyethylene insert, and size 32 press-fit patellar button.  SURGEON:  Lind Guest. [name], M.D.  ASSISTANT:  Erskine Emery, PA-C.  ANESTHESIA: 1. Left lower extremity adductor canal block. 2. Spinal.  ANTIBIOTICS:  2 grams of IV Ancef.  BLOOD LOSS:  Less than 200 mL.  COMPLICATIONS:  None.  TOURNIQUET TIME:  Less than 1 hour.  INDICATION:  Mr. Livsey is a 76 year old gentleman, well known to me. He has known primary osteoarthritis and degenerative joint disease of his left knee.  He has tried and failed all forms of conservative treatment and even had an arthroscopic intervention of that left knee remotely.  His pain is daily.  He does have recurrent effusions of his knee.  After continued pain as well as the detrimental effect this has had on his activities of daily living, his quality of life and his mobility, he did wish to proceed with a total knee arthroplasty.  He understands the risks of acute blood loss anemia, nerve and vessel injury, fracture, infection, and DVT.  He understands our goals are to decrease pain, improve mobility, and overall improved quality of life.  PROCEDURE DESCRIPTION:  After informed consent was obtained, appropriate left knee was marked.  An adductor canal block was obtained in  the holding area.  He was then brought to the operating room and sat up on the operating table, where spinal anesthesia was obtained.  He was then laid in a supine position on the operating table.  Foley catheter was placed.  A nonsterile tourniquet was placed around his upper left thigh. His left leg was then prepped and draped from the thigh down to the toes with DuraPrep and sterile drapes.  Time-out was called.  He was identified as correct patient and correct left knee.  We then used an Esmarch to wrap out the leg and tourniquet was inflated to 300 mmHg of pressure.  We then made a direct midline incision over the patella and carried this proximally distally.  We dissected down the knee joint and carried out a medial parapatellar arthrotomy, finding a very large joint effusion and finding significant cartilage loss in the patellofemoral joint, in the medial compartment of his knee.  Of note, his line was near neutral.  After we removed remnants of the ACL, PCL, medial and lateral meniscus, we had the knee in a flexed position and using the extramedullary cutting guide for making our tibia cut, we set our cut to take 9 mm off the high side, correcting for varus and valgus and  neutral slope.  We made this cut without difficulty.  We then went to the femur and used the intramedullary guide for the femur.  We did this through the notch and then set our distal femoral cutting guide for the left knee at 5 degrees and externally rotated, taking 10 mm off the distal femoral cut.  We made this cut without difficulty.  We then pulled the knee back down to full extension with a 9-mm extension block and it achieved full extension.  We then went back to the femur and put our femoral sizing guide based off the epicondylar axis for the left knee. 3 degrees externally rotated and chose a size-5 femur.  We put our 4-in- 1 cutting block for a size-5 femur, made our anterior and posterior cuts followed  by our chamfer cuts.  We then made a size-5 femur femoral box cut.  We then went back to the tibia for coverage of the tibial plateau, chose a size-5 tibial baseplate.  We set the rotation off the tibia and the femur, and then made our keel punch off this.  We then trialed 9-mm fix-bearing polyethylene insert.  We felt like we needed 11 mm, which we were pleased with.  We then made our patellar cut and drilled 3 holes for a press-fit patellar button.  We then removed all trial instrumentation and irrigated the knee with normal saline solution.  We then placed our real press-fit tibial tray, size 5, for left knee followed by the Stryker size-5 left femur.  We placed the 11-mm fix- bearing polyethylene insert and press-fit our size-32 patellar button. We then let the tourniquet down.  After putting the knee through range of motion, we were pleased with the range of motion and stability.  The tourniquet was let down.  Hemostasis was obtained with electrocautery. We then irrigated the knee with normal saline solution.  I, again, closed the arthrotomy with interrupted #1 Vicryl suture followed by 0 Vicryl in the deep tissue, 2-0 Vicryl in the subcutaneous tissue, 4-0 Monocryl subcuticular stitch, and Steri-Strips on the skin.  A well- padded sterile dressing was applied.  He was taken to the recovery room in a stable condition.  All final counts were correct.  There were no complications noted.     Lind Guest. [name], M.D.     CYB/MEDQ  D:  03/28/2017  T:  03/29/2017  Job:  [id number]

## 2017-03-31 NOTE — Discharge Summary (Signed)
Patient ID: Carlos Juarez MRN: [id number] DOB/AGE: [birth year] 75 y.o.  Admit date: 03/28/2017 Discharge date: 03/31/2017  Admission Diagnoses:  Principal Problem:   Status post total left knee replacement   Discharge Diagnoses:  Same  Past Medical History:  Diagnosis Date  . Arthritis   . 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 HTN (hypertension)   . Hypercholesteremia   . Hypothyroidism   . Pneumonia   . PVC's (premature ventricular contractions)   . Synovial cyst     Surgeries: Procedure(s): LEFT TOTAL KNEE ARTHROPLASTY on 03/28/2017   Consultants:   Discharged Condition: Improved  Hospital Course: ABED SCHAR is an 76 y.o. male who was admitted 03/28/2017 for operative treatment ofStatus post total left knee replacement. Patient has severe unremitting pain that affects sleep, daily activities, and work/hobbies. After pre-op clearance the patient was taken to the operating room on 03/28/2017 and underwent  Procedure(s): LEFT TOTAL KNEE ARTHROPLASTY.    Patient was given perioperative antibiotics:  Anti-infectives (From admission, onward)   Start     Dose/Rate Route Frequency Ordered Stop   03/28/17 1700  ceFAZolin (ANCEF) IVPB 1 g/50 mL premix     1 g 100 mL/hr over 30 Minutes Intravenous Every 6 hours 03/28/17 1532 03/28/17 2238   03/28/17 0901  ceFAZolin (ANCEF) 2-4 GM/100ML-% IVPB    Comments:  [place] Session   : cabinet override      03/28/17 0901 03/28/17 1110   03/28/17 0857  ceFAZolin (ANCEF) IVPB 2g/100 mL premix     2 g 200 mL/hr over 30 Minutes Intravenous On call to O.R. 03/28/17 0857 03/28/17 1125       Patient was given sequential compression devices, early ambulation, and chemoprophylaxis to prevent DVT.  Patient benefited maximally from hospital stay and there were no complications.    Recent vital signs:  Patient Vitals for the past 24 hrs:  BP Temp Temp src Pulse Resp SpO2  03/30/17 2100 117/78 97.9 F  (36.6 C) Oral 79 16 95 %  03/30/17 1430 120/61 (!) 97.5 F (36.4 C) Oral 66 16 96 %     Recent laboratory studies:  Recent Labs    03/29/17 0503 03/30/17 0450  WBC 13.4* 12.2*  HGB 14.9 14.3  HCT 43.6 40.0  PLT 201 179  NA 139  --   K 3.8  --   CL 103  --   CO2 30  --   BUN 17  --   CREATININE 1.05  --   GLUCOSE 141*  --   CALCIUM 8.4*  --      Discharge Medications:   Allergies as of 03/31/2017      Reactions   Epinephrine Other (See Comments)   Light headedness      Medication List    STOP taking these medications   naproxen 500 MG tablet Commonly known as:  NAPROSYN     TAKE these medications   aspirin 325 MG EC tablet Take 1 tablet (325 mg total) by mouth 2 (two) times daily after a meal.   atenolol 50 MG tablet Commonly known as:  TENORMIN Take 50 mg by mouth daily.   ezetimibe-simvastatin 10-20 MG tablet Commonly known as:  VYTORIN Take 1 tablet daily by mouth. What changed:  when to take this   FISH OIL + D3 PO Take 5 mLs by mouth 3 (three) times a week.   levothyroxine 150 MCG tablet Commonly known as:  SYNTHROID, LEVOTHROID  Take 150 mcg by mouth daily before breakfast.   methocarbamol 500 MG tablet Commonly known as:  ROBAXIN Take 1 tablet (500 mg total) by mouth every 6 (six) hours as needed for muscle spasms.   oxyCODONE-acetaminophen 5-325 MG tablet Commonly known as:  ROXICET Take 1-2 tablets by mouth every 4 (four) hours as needed.   oxymetazoline 0.05 % nasal spray Commonly known as:  AFRIN Place 1 spray into both nostrils 2 (two) times daily as needed for congestion.            Durable Medical Equipment  (From admission, onward)        Start     Ordered   03/30/17 0921  For home use only DME 3 n 1  Once     03/30/17 0921   03/30/17 0921  For home use only DME Walker rolling  Once    Question:  Patient needs a walker to treat with the following condition  Answer:  S/P total knee arthroplasty, left   03/30/17 1410       Diagnostic Studies: Dg Knee Left Port  Result Date: 03/28/2017 CLINICAL DATA:  Status post left knee replacement. EXAM: PORTABLE LEFT KNEE - 1-2 VIEW COMPARISON:  MRI of the knee 02/22/2013, knee radiograph 02/05/2017 FINDINGS: Post total left knee arthroplasty with normal alignment of the orthopedic hardware and no evidence of fracture. Expected postsurgical soft tissue edema and emphysema. IMPRESSION: Post recent total left knee arthroplasty without evidence of immediate complications. Electronically Signed   By: Fidela Salisbury M.D.   On: 03/28/2017 13:23    Disposition: 01-Home or Self Care  Discharge Instructions    Discharge patient   Complete by:  As directed    Discharge disposition:  01-Home or Self Care   Discharge patient date:  03/31/2017      Follow-up Information    Mcarthur Rossetti, MD Follow up in 2 week(s).   Specialty:  Orthopedic Surgery Contact information: [place] [address] [phone]            Signed: Mcarthur Rossetti 03/31/2017, 7:13 AM

## 2017-03-31 NOTE — Progress Notes (Signed)
Physical Therapy Treatment Patient Details Name: Carlos Juarez MRN: [id number] DOB: February 22, 1941 Today's Date: 03/31/2017    History of Present Illness Pt s/p L TKR and with hx of lumbar lami 9/18    PT Comments    POD # 3 Assisted with amb a greater distance and performed all supine TKR TE's following handout HEP.  Instructed on proper tech, sequencing as well as use of ICE. Pt is ready for D/C to home.   Follow Up Recommendations  Home health PT     Equipment Recommendations  Rolling walker with 5" wheels    Recommendations for Other Services       Precautions / Restrictions Precautions Precautions: Knee;Fall Restrictions Weight Bearing Restrictions: No Other Position/Activity Restrictions: WBAT    Mobility  Bed Mobility               General bed mobility comments: OOB in recliner  Transfers Overall transfer level: Needs assistance Equipment used: Rolling walker (2 wheeled) Transfers: Sit to/from Stand Sit to Stand: Supervision         General transfer comment: one VC for safety with turn completion  Ambulation/Gait Ambulation/Gait assistance: Supervision Ambulation Distance (Feet): 85 Feet Assistive device: Rolling walker (2 wheeled) Gait Pattern/deviations: Decreased step length - right;Decreased step length - left;Shuffle;Trunk flexed;Step-to pattern;Step-through pattern Gait velocity: decr   General Gait Details: cues for sequence, posture and position from [employer]: (no stairs)          Wheelchair Mobility    Modified Rankin (Stroke Patients Only)       Balance                                            Cognition Arousal/Alertness: Awake/alert Behavior During Therapy: WFL for tasks assessed/performed Overall Cognitive Status: Within Functional Limits for tasks assessed                                        Exercises   Total Knee Replacement TE's 10 reps B LE ankle pumps 10  reps towel squeezes 10 reps knee presses 10 reps heel slides  10 reps SAQ's 10 reps SLR's 10 reps ABD Followed by ICE     General Comments        Pertinent Vitals/Pain Pain Assessment: 0-10 Pain Score: 4  Pain Location: L knee Pain Descriptors / Indicators: Aching;Sore;Operative site guarding Pain Intervention(s): Monitored during session;Ice applied;Premedicated before session;Repositioned    Home Living                      Prior Function            PT Goals (current goals can now be found in the care plan section) Progress towards PT goals: Progressing toward goals    Frequency    7X/week      PT Plan Current plan remains appropriate    Co-evaluation              AM-PAC PT "6 Clicks" Daily Activity  Outcome Measure  Difficulty turning over in bed (including adjusting bedclothes, sheets and blankets)?: A Lot Difficulty moving from lying on back to sitting on the side of the bed? : A Lot Difficulty sitting down on and standing up from a chair with  arms (e.g., wheelchair, bedside commode, etc,.)?: A Lot Help needed moving to and from a bed to chair (including a wheelchair)?: A Little Help needed walking in hospital room?: A Little Help needed climbing 3-5 steps with a railing? : A Little 6 Click Score: 15    End of Session Equipment Utilized During Treatment: Gait belt Activity Tolerance: Patient tolerated treatment well Patient left: in bed;with call bell/phone within reach;with family/visitor present Nurse Communication: Mobility status PT Visit Diagnosis: Difficulty in walking, not elsewhere classified (R26.2)     Time: 0930-1000 PT Time Calculation (min) (ACUTE ONLY): 30 min  Charges:  $Gait Training: 8-22 mins $Therapeutic Exercise: 8-22 mins                    G Codes:       Rica Koyanagi  PTA [facility]  Acute  Rehab Pager      [phone]

## 2017-04-01 DIAGNOSIS — Z96652 Presence of left artificial knee joint: Secondary | ICD-10-CM | POA: Diagnosis not present

## 2017-04-01 DIAGNOSIS — M47812 Spondylosis without myelopathy or radiculopathy, cervical region: Secondary | ICD-10-CM | POA: Diagnosis not present

## 2017-04-01 DIAGNOSIS — Z7982 Long term (current) use of aspirin: Secondary | ICD-10-CM | POA: Diagnosis not present

## 2017-04-01 DIAGNOSIS — I493 Ventricular premature depolarization: Secondary | ICD-10-CM | POA: Diagnosis not present

## 2017-04-01 DIAGNOSIS — I10 Essential (primary) hypertension: Secondary | ICD-10-CM | POA: Diagnosis not present

## 2017-04-01 DIAGNOSIS — Z471 Aftercare following joint replacement surgery: Secondary | ICD-10-CM | POA: Diagnosis not present

## 2017-04-03 DIAGNOSIS — Z471 Aftercare following joint replacement surgery: Secondary | ICD-10-CM | POA: Diagnosis not present

## 2017-04-03 DIAGNOSIS — I493 Ventricular premature depolarization: Secondary | ICD-10-CM | POA: Diagnosis not present

## 2017-04-03 DIAGNOSIS — M47812 Spondylosis without myelopathy or radiculopathy, cervical region: Secondary | ICD-10-CM | POA: Diagnosis not present

## 2017-04-03 DIAGNOSIS — Z7982 Long term (current) use of aspirin: Secondary | ICD-10-CM | POA: Diagnosis not present

## 2017-04-03 DIAGNOSIS — I10 Essential (primary) hypertension: Secondary | ICD-10-CM | POA: Diagnosis not present

## 2017-04-03 DIAGNOSIS — Z96652 Presence of left artificial knee joint: Secondary | ICD-10-CM | POA: Diagnosis not present

## 2017-04-04 DIAGNOSIS — Z471 Aftercare following joint replacement surgery: Secondary | ICD-10-CM | POA: Diagnosis not present

## 2017-04-04 DIAGNOSIS — I493 Ventricular premature depolarization: Secondary | ICD-10-CM | POA: Diagnosis not present

## 2017-04-04 DIAGNOSIS — M47812 Spondylosis without myelopathy or radiculopathy, cervical region: Secondary | ICD-10-CM | POA: Diagnosis not present

## 2017-04-04 DIAGNOSIS — I10 Essential (primary) hypertension: Secondary | ICD-10-CM | POA: Diagnosis not present

## 2017-04-04 DIAGNOSIS — Z96652 Presence of left artificial knee joint: Secondary | ICD-10-CM | POA: Diagnosis not present

## 2017-04-04 DIAGNOSIS — Z7982 Long term (current) use of aspirin: Secondary | ICD-10-CM | POA: Diagnosis not present

## 2017-04-07 DIAGNOSIS — Z471 Aftercare following joint replacement surgery: Secondary | ICD-10-CM | POA: Diagnosis not present

## 2017-04-07 DIAGNOSIS — Z96652 Presence of left artificial knee joint: Secondary | ICD-10-CM | POA: Diagnosis not present

## 2017-04-07 DIAGNOSIS — I10 Essential (primary) hypertension: Secondary | ICD-10-CM | POA: Diagnosis not present

## 2017-04-07 DIAGNOSIS — M47812 Spondylosis without myelopathy or radiculopathy, cervical region: Secondary | ICD-10-CM | POA: Diagnosis not present

## 2017-04-07 DIAGNOSIS — Z7982 Long term (current) use of aspirin: Secondary | ICD-10-CM | POA: Diagnosis not present

## 2017-04-07 DIAGNOSIS — I493 Ventricular premature depolarization: Secondary | ICD-10-CM | POA: Diagnosis not present

## 2017-04-09 DIAGNOSIS — M47812 Spondylosis without myelopathy or radiculopathy, cervical region: Secondary | ICD-10-CM | POA: Diagnosis not present

## 2017-04-09 DIAGNOSIS — Z96652 Presence of left artificial knee joint: Secondary | ICD-10-CM | POA: Diagnosis not present

## 2017-04-09 DIAGNOSIS — I493 Ventricular premature depolarization: Secondary | ICD-10-CM | POA: Diagnosis not present

## 2017-04-09 DIAGNOSIS — Z471 Aftercare following joint replacement surgery: Secondary | ICD-10-CM | POA: Diagnosis not present

## 2017-04-09 DIAGNOSIS — I10 Essential (primary) hypertension: Secondary | ICD-10-CM | POA: Diagnosis not present

## 2017-04-09 DIAGNOSIS — Z7982 Long term (current) use of aspirin: Secondary | ICD-10-CM | POA: Diagnosis not present

## 2017-04-10 ENCOUNTER — Other Ambulatory Visit (INDEPENDENT_AMBULATORY_CARE_PROVIDER_SITE_OTHER): Payer: Self-pay

## 2017-04-10 ENCOUNTER — Ambulatory Visit (INDEPENDENT_AMBULATORY_CARE_PROVIDER_SITE_OTHER): Payer: Medicare Other | Admitting: Orthopaedic Surgery

## 2017-04-10 ENCOUNTER — Encounter (INDEPENDENT_AMBULATORY_CARE_PROVIDER_SITE_OTHER): Payer: Self-pay | Admitting: Orthopaedic Surgery

## 2017-04-10 ENCOUNTER — Telehealth (INDEPENDENT_AMBULATORY_CARE_PROVIDER_SITE_OTHER): Payer: Self-pay | Admitting: Orthopaedic Surgery

## 2017-04-10 DIAGNOSIS — Z96652 Presence of left artificial knee joint: Secondary | ICD-10-CM

## 2017-04-10 MED ORDER — OXYCODONE-ACETAMINOPHEN 5-325 MG PO TABS: 1.0000 | ORAL_TABLET | Freq: Four times a day (QID) | ORAL | 0 refills | Status: DC | PRN

## 2017-04-10 NOTE — Telephone Encounter (Signed)
Please call pt [phone].Pt has question about his med.

## 2017-04-10 NOTE — Progress Notes (Signed)
The patient is 2 weeks status post a left total knee arthroplasty.  He is doing well with home therapy.  There is no see him the next week and then we can set up outpatient therapy.  He does need refill on pain medications.  He says he has been able to flex his knee to 90 degrees.  He denies any foot swelling as well.  Is been wearing TED hose and is been on it twice a day  On exam his calf is soft.  His left knee incision looks good I removed the old Steri-Strips in place new Steri-Strips.  His knee is swollen to be expected postoperative.  His calf is soft.  He has almost full extension to about 90 degrees flexion.  At this point continue to increase his activities and we will transition him is going to outpatient therapy to work on range of motion and strengthening of his left knee.  I did refill his pain medication today.  I will see him back in 4 weeks as he is doing overall but no x-rays are needed.

## 2017-04-11 DIAGNOSIS — Z7982 Long term (current) use of aspirin: Secondary | ICD-10-CM | POA: Diagnosis not present

## 2017-04-11 DIAGNOSIS — Z96652 Presence of left artificial knee joint: Secondary | ICD-10-CM | POA: Diagnosis not present

## 2017-04-11 DIAGNOSIS — I493 Ventricular premature depolarization: Secondary | ICD-10-CM | POA: Diagnosis not present

## 2017-04-11 DIAGNOSIS — M47812 Spondylosis without myelopathy or radiculopathy, cervical region: Secondary | ICD-10-CM | POA: Diagnosis not present

## 2017-04-11 DIAGNOSIS — Z471 Aftercare following joint replacement surgery: Secondary | ICD-10-CM | POA: Diagnosis not present

## 2017-04-11 DIAGNOSIS — I10 Essential (primary) hypertension: Secondary | ICD-10-CM | POA: Diagnosis not present

## 2017-04-11 NOTE — Telephone Encounter (Signed)
Patient aware of the below message  

## 2017-04-11 NOTE — Telephone Encounter (Signed)
Is he to continue the aspirin?

## 2017-04-11 NOTE — Telephone Encounter (Signed)
He can stop his aspirin

## 2017-04-14 DIAGNOSIS — Z96652 Presence of left artificial knee joint: Secondary | ICD-10-CM | POA: Diagnosis not present

## 2017-04-14 DIAGNOSIS — Z471 Aftercare following joint replacement surgery: Secondary | ICD-10-CM | POA: Diagnosis not present

## 2017-04-14 DIAGNOSIS — M47812 Spondylosis without myelopathy or radiculopathy, cervical region: Secondary | ICD-10-CM | POA: Diagnosis not present

## 2017-04-14 DIAGNOSIS — Z7982 Long term (current) use of aspirin: Secondary | ICD-10-CM | POA: Diagnosis not present

## 2017-04-14 DIAGNOSIS — I493 Ventricular premature depolarization: Secondary | ICD-10-CM | POA: Diagnosis not present

## 2017-04-14 DIAGNOSIS — I10 Essential (primary) hypertension: Secondary | ICD-10-CM | POA: Diagnosis not present

## 2017-04-16 DIAGNOSIS — Z471 Aftercare following joint replacement surgery: Secondary | ICD-10-CM | POA: Diagnosis not present

## 2017-04-16 DIAGNOSIS — Z96652 Presence of left artificial knee joint: Secondary | ICD-10-CM | POA: Diagnosis not present

## 2017-04-16 DIAGNOSIS — I493 Ventricular premature depolarization: Secondary | ICD-10-CM | POA: Diagnosis not present

## 2017-04-16 DIAGNOSIS — M47812 Spondylosis without myelopathy or radiculopathy, cervical region: Secondary | ICD-10-CM | POA: Diagnosis not present

## 2017-04-16 DIAGNOSIS — Z7982 Long term (current) use of aspirin: Secondary | ICD-10-CM | POA: Diagnosis not present

## 2017-04-16 DIAGNOSIS — I10 Essential (primary) hypertension: Secondary | ICD-10-CM | POA: Diagnosis not present

## 2017-04-17 ENCOUNTER — Encounter: Payer: Self-pay | Admitting: Physical Therapy

## 2017-04-17 ENCOUNTER — Ambulatory Visit: Payer: Medicare Other | Attending: Orthopaedic Surgery | Admitting: Physical Therapy

## 2017-04-17 DIAGNOSIS — M25562 Pain in left knee: Secondary | ICD-10-CM | POA: Insufficient documentation

## 2017-04-17 DIAGNOSIS — M25662 Stiffness of left knee, not elsewhere classified: Secondary | ICD-10-CM | POA: Diagnosis not present

## 2017-04-17 DIAGNOSIS — R2689 Other abnormalities of gait and mobility: Secondary | ICD-10-CM

## 2017-04-17 NOTE — Therapy (Signed)
[place], [place], [address] Phone: [phone]   Fax:  [phone]  Physical Therapy Evaluation  Patient Details  Name: ROBIE MCNIEL MRN: [id number] Date of Birth: 26-Nov-1940 Referring Provider: Dr. Jeronimo Norma   Encounter Date: 04/17/2017  PT End of Session - 04/17/17 1359    Visit Number  1    Number of Visits  16    Date for PT Re-Evaluation  06/12/17    Authorization Type  Medicare/Tricare    PT Start Time  1315    PT Stop Time  1353    PT Time Calculation (min)  38 min    Activity Tolerance  Patient tolerated treatment well    Behavior During Therapy  [facility] for tasks assessed/performed       Past Medical History:  Diagnosis Date  . Arthritis   . Cancer ([place])    skin cancer removed rom nose non melanoma  . HTN (hypertension)   . Hypercholesteremia   . Hypothyroidism   . Pneumonia   . PVC's (premature ventricular contractions)   . Synovial cyst     Past Surgical History:  Procedure Laterality Date  . CARPAL TUNNEL RELEASE    . HERNIA REPAIR     as a child r inguinal  . JOINT REPLACEMENT     TKA Dr. [name] 03/28/17  . LUMBAR LAMINECTOMY/DECOMPRESSION MICRODISCECTOMY N/A 12/25/2016   Procedure: Laminectomy and Foraminotomy - Lumbar two-Lumbar three - Lumbar three-Lumbar four, resection of synovial cyst;  Surgeon: Eustace Moore, MD;  Location: [facility];  Service: Neurosurgery;  Laterality: N/A;  . MENISCUS REPAIR Left   . synovial cyst removed     12/25/16  . TONSILLECTOMY    . TOTAL KNEE ARTHROPLASTY Left 03/28/2017   Procedure: LEFT TOTAL KNEE ARTHROPLASTY;  Surgeon: Mcarthur Rossetti, MD;  [facility];  Service: Orthopedics;  Laterality: Left;    There were no vitals filed for this visit.   Subjective Assessment - 04/17/17 1318    Subjective  Pt is a 77 y/o male who presents to [place] s/p Lt TKA on 03/28/17.  Pt had HHPT x 2 wks, and is discharged from [place] as of  04/16/17.  Pt presents today with continued ROM limitations and gait difficulties.    Limitations  Standing;Walking;House hold activities    How long can you stand comfortably?  45 min    How long can you walk comfortably?  "couple hundred yards"    Patient Stated Goals  improve pain, mobility, ROM    Currently in Pain?  Yes    Pain Score  3  up to 6/10    Pain Location  Knee    Pain Orientation  Left    Pain Descriptors / Indicators  Aching    Pain Type  Surgical pain;Acute pain    Pain Onset  1 to 4 weeks ago    Pain Frequency  Intermittent    Aggravating Factors   standing, walking, end ranges of knee    Pain Relieving Factors  ice, meds         [facility] PT Assessment - 04/17/17 1322      Assessment   Medical Diagnosis  Lt TKA    Referring Provider  Dr. Jeronimo Norma    Onset Date/Surgical Date  03/28/17    Next MD Visit  05/12/17    Prior Therapy  HHPT x 2 wks      Precautions   Precautions  None  Restrictions   Weight Bearing Restrictions  No      Balance Screen   Has the patient fallen in the past 6 months  No    Has the patient had a decrease in activity level because of a fear of falling?   No    Is the patient reluctant to leave their home because of a fear of falling?   No      Home Social worker  Private residence    Living Arrangements  Spouse/significant other    Type of [place] 4 story townhouse    Home Access  Level entry    [place] with elevator    Home Equipment  [place] - single point;Walker - 2 wheels      Prior Function   Level of Independence  Independent    Vocation  Retired    [employer]  Retired from [employer]; and retired from school system    Leisure  work out with trainer 2x/wk Buren Kos); bowling on Friday nights      Cognition   Overall Cognitive Status  Within Functional Limits for tasks assessed      Observation/Other Assessments   Skin Integrity  healing  incision with steri strips still intact; Lt knee swelling noted    Focus on Therapeutic Outcomes (FOTO)   47 (53% limited; predicted 34% limited)      ROM / Strength   AROM / PROM / Strength  AROM;PROM;Strength      AROM   AROM Assessment Site  Knee    Right/Left Knee  Right;Left    Right Knee Extension  0    Right Knee Flexion  120    Left Knee Extension  3    Left Knee Flexion  80      PROM   PROM Assessment Site  Knee    Right/Left Knee  Left    Left Knee Extension  1    Left Knee Flexion  85      Strength   Overall Strength Comments  5 degree quad lag noted with SLR on LT    Strength Assessment Site  Hip;Knee;Ankle    Right/Left Hip  Right;Left    Right Hip Flexion  5/5    Right Hip Extension  3-/5    Right Hip ABduction  4-/5    Left Hip Flexion  4/5    Left Hip Extension  3-/5    Left Hip ABduction  4/5    Right/Left Knee  Right;Left    Left Knee Flexion  3-/5    Left Knee Extension  3-/5    Right/Left Ankle  Right;Left    Right Ankle Dorsiflexion  4/5    Left Ankle Dorsiflexion  4/5      Palpation   Palpation comment  trigger points and tightness noted in lateral quad, atrophy of VMO noticable on LLE      Ambulation/Gait   Ambulation Distance (Feet)  100 Feet    Assistive device  Straight cane    Gait Pattern  Decreased stance time - left;Decreased step length - right;Decreased hip/knee flexion - left;Antalgic    Ambulation Surface  Level;Indoor             Objective measurements completed on examination: See above findings.      [facility] Adult PT Treatment/Exercise - 04/17/17 1322      Self-Care   Self-Care  Other Self-Care Comments    Other  Self-Care Comments   instructed to bring HEP from HHPT to next session; advised to stay off stationary bike until after we see pt in clinic to advise on safety and recommendations      Manual Therapy   Manual Therapy  Soft tissue mobilization;Passive ROM    Soft tissue mobilization  Lt lateral quad     Passive ROM  Lt knee flexion/extension             PT Education - 04/17/17 1359    Education provided  Yes    Education Details  see self care    Person(s) Educated  Patient    Methods  Explanation    Comprehension  Verbalized understanding       PT Short Term Goals - 04/17/17 1402      PT SHORT TERM GOAL #1   Title  independent with initial HEP    Status  New    Target Date  05/15/17      PT SHORT TERM GOAL #2   Title  improve Lt knee AROM 0-95 degrees for improved function    Status  New    Target Date  05/15/17        PT Long Term Goals - 04/17/17 1403      PT LONG TERM GOAL #1   Title  independent with advanced HEP    Status  New    Target Date  06/12/17      PT LONG TERM GOAL #2   Title  improve Lt knee AROM 0-110 for improved functional mobility    Status  New    Target Date  06/12/17      PT LONG TERM GOAL #3   Title  amb > 500' on various surfaces without device independently for improved functional mobility    Status  New    Target Date  06/12/17      PT LONG TERM GOAL #4   Title  report pain < 2/10 with exercise and activity for improved function    Status  New    Target Date  06/12/17      PT LONG TERM GOAL #5   Title  perform bowling simulated activities without LOB or increase in pain in order to return to regular hobbies    Status  New    Target Date  06/12/17             Plan - 04/17/17 1359    Clinical Impression Statement  Pt is a 77 y/o male who presents to [place] s/p Lt TKA on 03/28/17.  Pt demonstrates decreased ROM and strength, gait abnormalities and increased fascial restrictions affecting functional mobility.  Pt will benefit from PT to address deficits listed.    Clinical Presentation  Stable    Clinical Decision Making  Low    Rehab Potential  Good    PT Frequency  2x / week    PT Duration  8 weeks    PT Treatment/Interventions  ADLs/Self Care Home Management;Cryotherapy;Electrical Stimulation;Moist Heat;Therapeutic  exercise;Therapeutic activities;Functional mobility training;Stair training;Gait training;DME Instruction;Balance training;Neuromuscular re-education;Patient/family education;Manual techniques;Vasopneumatic Device;Taping;Dry needling;Passive range of motion;Compression bandaging;Scar mobilization    PT Next Visit Plan  review HHPT HEP (pt to bring), stationary bike (pt has at home and wants to do at home), aggressive ROM, modalities/manual PRN    Consulted and Agree with Plan of Care  Patient       Patient will benefit from skilled therapeutic intervention in order to improve the following deficits  and impairments:  Abnormal gait, Pain, Increased fascial restricitons, Increased muscle spasms, Difficulty walking, Decreased mobility, Decreased strength, Decreased range of motion, Impaired flexibility, Increased edema  Visit Diagnosis: Acute pain of left knee - Plan: PT plan of care cert/re-cert  Stiffness of left knee, not elsewhere classified - Plan: PT plan of care cert/re-cert  Other abnormalities of gait and mobility - Plan: PT plan of care cert/re-cert     Problem List Patient Active Problem List   Diagnosis Date Noted  . Status post total left knee replacement 03/28/2017  . Unilateral primary osteoarthritis, left knee 02/19/2017  . Chronic pain of left knee 02/19/2017  . S/P lumbar laminectomy 12/25/2016  . 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 HTN (hypertension)   . Hypercholesteremia   . Hypothyroidism   . PVC's (premature ventricular contractions)       Laureen Abrahams, PT, DPT 04/17/17 2:06 PM    [place], [place], [address] Phone: [phone]   Fax:  [phone]  Name: ANIEL HUBBLE MRN: [id number] Date of Birth: 1941-04-05

## 2017-04-18 ENCOUNTER — Ambulatory Visit: Payer: Medicare Other | Admitting: Physical Therapy

## 2017-04-18 ENCOUNTER — Encounter: Payer: Self-pay | Admitting: Physical Therapy

## 2017-04-18 DIAGNOSIS — M25662 Stiffness of left knee, not elsewhere classified: Secondary | ICD-10-CM

## 2017-04-18 DIAGNOSIS — M25562 Pain in left knee: Secondary | ICD-10-CM | POA: Diagnosis not present

## 2017-04-18 DIAGNOSIS — R2689 Other abnormalities of gait and mobility: Secondary | ICD-10-CM

## 2017-04-18 NOTE — Therapy (Signed)
[place], [place], [address] Phone: [phone]   Fax:  [phone]  Physical Therapy Treatment  Patient Details  Name: Carlos Juarez MRN: [id number] Date of Birth: [birth year] Referring Provider: Dr. Jeronimo Norma   Encounter Date: 04/18/2017  PT End of Session - 04/18/17 1237    Visit Number  2    Number of Visits  16    Date for PT Re-Evaluation  06/12/17    PT Start Time  8676    PT Stop Time  1231    PT Time Calculation (min)  43 min    Activity Tolerance  Patient tolerated treatment well    Behavior During Therapy  [facility] for tasks assessed/performed       Past Medical History:  Diagnosis Date  . Arthritis   . Cancer ([place])    skin cancer removed rom nose non melanoma  . HTN (hypertension)   . Hypercholesteremia   . Hypothyroidism   . Pneumonia   . PVC's (premature ventricular contractions)   . Synovial cyst     Past Surgical History:  Procedure Laterality Date  . CARPAL TUNNEL RELEASE    . HERNIA REPAIR     as a child r inguinal  . JOINT REPLACEMENT     TKA Dr. [name] 03/28/17  . LUMBAR LAMINECTOMY/DECOMPRESSION MICRODISCECTOMY N/A 12/25/2016   Procedure: Laminectomy and Foraminotomy - Lumbar two-Lumbar three - Lumbar three-Lumbar four, resection of synovial cyst;  Surgeon: Eustace Moore, MD;  Location: [facility];  Service: Neurosurgery;  Laterality: N/A;  . MENISCUS REPAIR Left   . synovial cyst removed     12/25/16  . TONSILLECTOMY    . TOTAL KNEE ARTHROPLASTY Left 03/28/2017   Procedure: LEFT TOTAL KNEE ARTHROPLASTY;  Surgeon: Mcarthur Rossetti, MD;  [facility];  Service: Orthopedics;  Laterality: Left;    There were no vitals filed for this visit.  Subjective Assessment - 04/18/17 1153    Subjective  " I am only having abotu 2-3/10 ache in the knee. I want to be able to ride my bike in the house"    Currently in Pain?  Yes    Pain Score  3     Pain  Orientation  Left    Pain Descriptors / Indicators  Aching    Pain Onset  1 to 4 weeks ago    Pain Frequency  Intermittent         [facility] PT Assessment - 04/17/17 1322      Assessment   Medical Diagnosis  Lt TKA    Referring Provider  Dr. Jeronimo Norma    Onset Date/Surgical Date  03/28/17    Next MD Visit  05/12/17    Prior Therapy  HHPT x 2 wks      Precautions   Precautions  None      Restrictions   Weight Bearing Restrictions  No      Balance Screen   Has the patient fallen in the past 6 months  No    Has the patient had a decrease in activity level because of a fear of falling?   No    Is the patient reluctant to leave their home because of a fear of falling?   No      Home Environment   Living Environment  Private residence    Living Arrangements  Spouse/significant other    Type of [place]  Access  Level entry    Home Layout  Multi-level with [place] - single point;Walker - 2 wheels      Prior Function   Level of Independence  Independent    Vocation  Retired    [employer]  Retired from [employer]; and retired from school system    Leisure  work out with trainer 2x/wk Buren Kos); bowling on Friday nights      Cognition   Overall Cognitive Status  Within Functional Limits for tasks assessed      Observation/Other Assessments   Skin Integrity  healing incision with steri strips still intact; Lt knee swelling noted    Focus on Therapeutic Outcomes (FOTO)   47 (53% limited; predicted 34% limited)      ROM / Strength   AROM / PROM / Strength  AROM;PROM;Strength      AROM   AROM Assessment Site  Knee    Right/Left Knee  Right;Left    Right Knee Extension  0    Right Knee Flexion  120    Left Knee Extension  3    Left Knee Flexion  80      PROM   PROM Assessment Site  Knee    Right/Left Knee  Left    Left Knee Extension  1    Left Knee Flexion  85      Strength    Overall Strength Comments  5 degree quad lag noted with SLR on LT    Strength Assessment Site  Hip;Knee;Ankle    Right/Left Hip  Right;Left    Right Hip Flexion  5/5    Right Hip Extension  3-/5    Right Hip ABduction  4-/5    Left Hip Flexion  4/5    Left Hip Extension  3-/5    Left Hip ABduction  4/5    Right/Left Knee  Right;Left    Left Knee Flexion  3-/5    Left Knee Extension  3-/5    Right/Left Ankle  Right;Left    Right Ankle Dorsiflexion  4/5    Left Ankle Dorsiflexion  4/5      Palpation   Palpation comment  trigger points and tightness noted in lateral quad, atrophy of VMO noticable on LLE      Ambulation/Gait   Ambulation Distance (Feet)  100 Feet    Assistive device  Straight cane    Gait Pattern  Decreased stance time - left;Decreased step length - right;Decreased hip/knee flexion - left;Antalgic    Ambulation Surface  Level;Indoor                  [facility] Adult PT Treatment/Exercise - 04/18/17 1155      Knee/Hip Exercises: Stretches   Active Hamstring Stretch  2 reps;30 seconds    Quad Stretch  30 seconds;3 reps contract/ relax with 10 sec contraction      Knee/Hip Exercises: Aerobic   Stationary Bike  bike L 1 x 6 min 75% revolutions      Knee/Hip Exercises: Machines for Strengthening   Cybex Leg Press  Leg press 2 x 12 20#, 1 x 12 20# concentric with both and eccentric with LLE      Knee/Hip Exercises: Standing   Gait Training  3 x 50 ft utilizing heel strike/ toe off (verbal cues and demonstration for proper form)      Manual Therapy   Manual Therapy  Joint mobilization  Joint Mobilization  grade 3-4 AP in supine with active flexion between, seated PA grade 3-4 with ER to promote screw home mechanism    Soft tissue mobilization  Lt lateral quad using roller how to perofrm at home using rolling pin    Passive ROM  L knee flexion with towel in popliteal space 10 x 5 sec hold             PT Education - 04/18/17 1236    Education  provided  Yes    Education Details  proper form with walking using toe off to promote knee flexion and gait efficency, reviewed previously provided HEP from HHPT.     Person(s) Educated  Patient    Methods  Explanation;Verbal cues;Demonstration    Comprehension  Verbalized understanding;Verbal cues required;Returned demonstration       PT Short Term Goals - 04/17/17 1402      PT SHORT TERM GOAL #1   Title  independent with initial HEP    Status  New    Target Date  05/15/17      PT SHORT TERM GOAL #2   Title  improve Lt knee AROM 0-95 degrees for improved function    Status  New    Target Date  05/15/17        PT Long Term Goals - 04/17/17 1403      PT LONG TERM GOAL #1   Title  independent with advanced HEP    Status  New    Target Date  06/12/17      PT LONG TERM GOAL #2   Title  improve Lt knee AROM 0-110 for improved functional mobility    Status  New    Target Date  06/12/17      PT LONG TERM GOAL #3   Title  amb > 500' on various surfaces without device independently for improved functional mobility    Status  New    Target Date  06/12/17      PT LONG TERM GOAL #4   Title  report pain < 2/10 with exercise and activity for improved function    Status  New    Target Date  06/12/17      PT LONG TERM GOAL #5   Title  perform bowling simulated activities without LOB or increase in pain in order to return to regular hobbies    Status  New    Target Date  06/12/17            Plan - 04/18/17 1237    Clinical Impression Statement  pt reports only 2-3/10 pain today. pt brought in his HEP from [place] and reviewed exercises. continued working knee ROM, hip/ knee strengthening and gait mechanics which he was able to perform with minimal verbal cues/ demonstration. post session he reported no pain and declined modalities    PT Next Visit Plan  bike, aggressive ROM, modalities/manual PRN, hip/ knee strengthening.     Consulted and Agree with Plan of Care  Patient        Patient will benefit from skilled therapeutic intervention in order to improve the following deficits and impairments:  Abnormal gait, Pain, Increased fascial restricitons, Increased muscle spasms, Difficulty walking, Decreased mobility, Decreased strength, Decreased range of motion, Impaired flexibility, Increased edema  Visit Diagnosis: Acute pain of left knee  Stiffness of left knee, not elsewhere classified  Other abnormalities of gait and mobility     Problem List Patient Active Problem List  Diagnosis Date Noted  . Status post total left knee replacement 03/28/2017  . Unilateral primary osteoarthritis, left knee 02/19/2017  . Chronic pain of left knee 02/19/2017  . S/P lumbar laminectomy 12/25/2016  . 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 HTN (hypertension)   . Hypercholesteremia   . Hypothyroidism   . PVC's (premature ventricular contractions)    [place] PT, DPT, LAT, ATC  04/18/17  12:42 PM      [facility] [address] [place], [place], [address] Phone: [phone]   Fax:  [phone]  Name: Carlos Juarez MRN: [id number] Date of Birth: 1940-12-10

## 2017-04-22 ENCOUNTER — Encounter: Payer: Self-pay | Admitting: Physical Therapy

## 2017-04-22 ENCOUNTER — Ambulatory Visit: Payer: Medicare Other | Admitting: Physical Therapy

## 2017-04-22 DIAGNOSIS — R2689 Other abnormalities of gait and mobility: Secondary | ICD-10-CM | POA: Diagnosis not present

## 2017-04-22 DIAGNOSIS — M25662 Stiffness of left knee, not elsewhere classified: Secondary | ICD-10-CM | POA: Diagnosis not present

## 2017-04-22 DIAGNOSIS — M25562 Pain in left knee: Secondary | ICD-10-CM | POA: Diagnosis not present

## 2017-04-22 NOTE — Therapy (Signed)
[place], [place], [address] Phone: [phone]   Fax:  [phone]  Physical Therapy Treatment  Patient Details  Name: Carlos Juarez MRN: [id number] Date of Birth: 10-10-1940 Referring Provider: Dr. Jeronimo Norma   Encounter Date: 04/22/2017  PT End of Session - 04/22/17 1053    Visit Number  3    Number of Visits  16    Date for PT Re-Evaluation  06/12/17    Authorization Type  Medicare/Tricare    PT Start Time  1015    PT Stop Time  1105    PT Time Calculation (min)  50 min    Activity Tolerance  Patient tolerated treatment well    Behavior During Therapy  [facility] for tasks assessed/performed       Past Medical History:  Diagnosis Date  . Arthritis   . Cancer ([place])    skin cancer removed rom nose non melanoma  . HTN (hypertension)   . Hypercholesteremia   . Hypothyroidism   . Pneumonia   . PVC's (premature ventricular contractions)   . Synovial cyst     Past Surgical History:  Procedure Laterality Date  . CARPAL TUNNEL RELEASE    . HERNIA REPAIR     as a child r inguinal  . JOINT REPLACEMENT     TKA Dr. [name] 03/28/17  . LUMBAR LAMINECTOMY/DECOMPRESSION MICRODISCECTOMY N/A 12/25/2016   Procedure: Laminectomy and Foraminotomy - Lumbar two-Lumbar three - Lumbar three-Lumbar four, resection of synovial cyst;  Surgeon: Eustace Moore, MD;  Location: [facility];  Service: Neurosurgery;  Laterality: N/A;  . MENISCUS REPAIR Left   . synovial cyst removed     12/25/16  . TONSILLECTOMY    . TOTAL KNEE ARTHROPLASTY Left 03/28/2017   Procedure: LEFT TOTAL KNEE ARTHROPLASTY;  Surgeon: Mcarthur Rossetti, MD;  [facility];  Service: Orthopedics;  Laterality: Left;    There were no vitals filed for this visit.  Subjective Assessment - 04/22/17 1018    Subjective  rode his bike over the weekend; unable to get full revolutions yet    Patient Stated Goals  improve pain, mobility, ROM     Currently in Pain?  No/denies c/o tightness                      [facility] Adult PT Treatment/Exercise - 04/22/17 1019      Knee/Hip Exercises: Stretches   Active Hamstring Stretch  Left;3 reps;30 seconds    Quad Stretch  30 seconds;3 reps contract/ relax with 10 sec contraction      Knee/Hip Exercises: Aerobic   Stationary Bike  bike L 1 x 8 min 75% revolutions      Knee/Hip Exercises: Supine   Short Arc Quad Sets  Left;10 reps 5 second hold    Heel Slides  AAROM;Left;10 reps with strap for increased knee flexion      Modalities   Modalities  Vasopneumatic      Vasopneumatic   Number Minutes Vasopneumatic   15 minutes    Vasopnuematic Location   Knee    Vasopneumatic Pressure  Medium    Vasopneumatic Temperature   max cold      Manual Therapy   Manual Therapy  Joint mobilization;Soft tissue mobilization;Passive ROM    Soft tissue mobilization  Lt lateral quad IASTM    Passive ROM  Lt knee flexion/extension               PT  Short Term Goals - 04/17/17 1402      PT SHORT TERM GOAL #1   Title  independent with initial HEP    Status  New    Target Date  05/15/17      PT SHORT TERM GOAL #2   Title  improve Lt knee AROM 0-95 degrees for improved function    Status  New    Target Date  05/15/17        PT Long Term Goals - 04/17/17 1403      PT LONG TERM GOAL #1   Title  independent with advanced HEP    Status  New    Target Date  06/12/17      PT LONG TERM GOAL #2   Title  improve Lt knee AROM 0-110 for improved functional mobility    Status  New    Target Date  06/12/17      PT LONG TERM GOAL #3   Title  amb > 500' on various surfaces without device independently for improved functional mobility    Status  New    Target Date  06/12/17      PT LONG TERM GOAL #4   Title  report pain < 2/10 with exercise and activity for improved function    Status  New    Target Date  06/12/17      PT LONG TERM GOAL #5   Title  perform bowling  simulated activities without LOB or increase in pain in order to return to regular hobbies    Status  New    Target Date  06/12/17            Plan - 04/22/17 1054    Clinical Impression Statement  Pt tolerated session well today including quad strengthening and aggressive ROM.  Pt close to full revolution backwards on bike.  Progressing well with PT.    PT Treatment/Interventions  ADLs/Self Care Home Management;Cryotherapy;Electrical Stimulation;Moist Heat;Therapeutic exercise;Therapeutic activities;Functional mobility training;Stair training;Gait training;DME Instruction;Balance training;Neuromuscular re-education;Patient/family education;Manual techniques;Vasopneumatic Device;Taping;Dry needling;Passive range of motion;Compression bandaging;Scar mobilization    PT Next Visit Plan  bike, aggressive ROM, modalities/manual PRN, hip/ knee strengthening.     Consulted and Agree with Plan of Care  Patient       Patient will benefit from skilled therapeutic intervention in order to improve the following deficits and impairments:  Abnormal gait, Pain, Increased fascial restricitons, Increased muscle spasms, Difficulty walking, Decreased mobility, Decreased strength, Decreased range of motion, Impaired flexibility, Increased edema  Visit Diagnosis: Acute pain of left knee  Stiffness of left knee, not elsewhere classified  Other abnormalities of gait and mobility     Problem List Patient Active Problem List   Diagnosis Date Noted  . Status post total left knee replacement 03/28/2017  . Unilateral primary osteoarthritis, left knee 02/19/2017  . Chronic pain of left knee 02/19/2017  . S/P lumbar laminectomy 12/25/2016  . 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 HTN (hypertension)   . Hypercholesteremia   . Hypothyroidism   . PVC's (premature ventricular contractions)       Laureen Abrahams, PT, DPT 04/22/17 10:55 AM    [place], [place], [address] Phone: [phone]   Fax:  [phone]  Name: Carlos Juarez MRN: [id number] Date of Birth: [birth year]

## 2017-04-24 ENCOUNTER — Encounter: Payer: Self-pay | Admitting: Physical Therapy

## 2017-04-24 ENCOUNTER — Ambulatory Visit: Payer: Medicare Other | Admitting: Physical Therapy

## 2017-04-24 DIAGNOSIS — M25562 Pain in left knee: Secondary | ICD-10-CM | POA: Diagnosis not present

## 2017-04-24 DIAGNOSIS — R2689 Other abnormalities of gait and mobility: Secondary | ICD-10-CM

## 2017-04-24 DIAGNOSIS — M25662 Stiffness of left knee, not elsewhere classified: Secondary | ICD-10-CM | POA: Diagnosis not present

## 2017-04-24 NOTE — Therapy (Signed)
[place], [place], [address] Phone: [phone]   Fax:  [phone]  Physical Therapy Treatment  Patient Details  Name: Carlos Juarez MRN: [id number] Date of Birth: [birth year] Referring Provider: Dr. Jeronimo Norma   Encounter Date: 04/24/2017  PT End of Session - 04/24/17 1053    Visit Number  4    Number of Visits  16    Date for PT Re-Evaluation  06/12/17    Authorization Type  Medicare/Tricare    PT Start Time  1013    PT Stop Time  1108    PT Time Calculation (min)  55 min    Activity Tolerance  Patient tolerated treatment well    Behavior During Therapy  [facility] for tasks assessed/performed       Past Medical History:  Diagnosis Date  . Arthritis   . Cancer ([place])    skin cancer removed rom nose non melanoma  . HTN (hypertension)   . Hypercholesteremia   . Hypothyroidism   . Pneumonia   . PVC's (premature ventricular contractions)   . Synovial cyst     Past Surgical History:  Procedure Laterality Date  . CARPAL TUNNEL RELEASE    . HERNIA REPAIR     as a child r inguinal  . JOINT REPLACEMENT     TKA Dr. [name] 03/28/17  . LUMBAR LAMINECTOMY/DECOMPRESSION MICRODISCECTOMY N/A 12/25/2016   Procedure: Laminectomy and Foraminotomy - Lumbar two-Lumbar three - Lumbar three-Lumbar four, resection of synovial cyst;  Surgeon: Eustace Moore, MD;  Location: [place];  Service: Neurosurgery;  Laterality: N/A;  . MENISCUS REPAIR Left   . synovial cyst removed     12/25/16  . TONSILLECTOMY    . TOTAL KNEE ARTHROPLASTY Left 03/28/2017   Procedure: LEFT TOTAL KNEE ARTHROPLASTY;  Surgeon: Mcarthur Rossetti, MD;  Location: [facility];  Service: Orthopedics;  Laterality: Left;    There were no vitals filed for this visit.  Subjective Assessment - 04/24/17 1017    Subjective  having increased pain at night; but also trying to sleep in regular bed rather than recliner    Patient Stated Goals   improve pain, mobility, ROM    Currently in Pain?  Yes    Pain Score  2     Pain Location  Knee    Pain Orientation  Left    Pain Descriptors / Indicators  Aching    Pain Type  Surgical pain;Acute pain    Pain Onset  1 to 4 weeks ago    Pain Frequency  Intermittent    Aggravating Factors   standing, walking, end ranges of knee    Pain Relieving Factors  ice, meds                      [facility] Adult PT Treatment/Exercise - 04/24/17 1019      Knee/Hip Exercises: Aerobic   Stationary Bike  bike L 1 x 8 min 75% revolutions      Knee/Hip Exercises: Machines for Strengthening   Cybex Knee Extension  2x10; 10#; LLE only with RLE assisted at end range only    Cybex Knee Flexion  2x10; 10# LLE only    Total Gym Leg Press  15# 2x10; LLE only      Modalities   Modalities  Vasopneumatic      Vasopneumatic   Number Minutes Vasopneumatic   15 minutes    Vasopnuematic Location   Knee  Vasopneumatic Pressure  Medium    Vasopneumatic Temperature   max cold      Manual Therapy   Manual Therapy  Soft tissue mobilization;Passive ROM    Soft tissue mobilization  Lt lateral quad IASTM    Passive ROM  Lt knee flexion/extension               PT Short Term Goals - 04/17/17 1402      PT SHORT TERM GOAL #1   Title  independent with initial HEP    Status  New    Target Date  05/15/17      PT SHORT TERM GOAL #2   Title  improve Lt knee AROM 0-95 degrees for improved function    Status  New    Target Date  05/15/17        PT Long Term Goals - 04/17/17 1403      PT LONG TERM GOAL #1   Title  independent with advanced HEP    Status  New    Target Date  06/12/17      PT LONG TERM GOAL #2   Title  improve Lt knee AROM 0-110 for improved functional mobility    Status  New    Target Date  06/12/17      PT LONG TERM GOAL #3   Title  amb > 500' on various surfaces without device independently for improved functional mobility    Status  New    Target Date  06/12/17       PT LONG TERM GOAL #4   Title  report pain < 2/10 with exercise and activity for improved function    Status  New    Target Date  06/12/17      PT LONG TERM GOAL #5   Title  perform bowling simulated activities without LOB or increase in pain in order to return to regular hobbies    Status  New    Target Date  06/12/17            Plan - 04/24/17 1054    Clinical Impression Statement  Pt tolerated strengthening exercises well today and decreased tenderness in lateral quad noted with IASTM today.  Progressing well with PT.    PT Treatment/Interventions  ADLs/Self Care Home Management;Cryotherapy;Electrical Stimulation;Moist Heat;Therapeutic exercise;Therapeutic activities;Functional mobility training;Stair training;Gait training;DME Instruction;Balance training;Neuromuscular re-education;Patient/family education;Manual techniques;Vasopneumatic Device;Taping;Dry needling;Passive range of motion;Compression bandaging;Scar mobilization    PT Next Visit Plan  bike, try elliptical per pt request;  aggressive ROM, modalities/manual PRN, hip/ knee strengthening.     Consulted and Agree with Plan of Care  Patient       Patient will benefit from skilled therapeutic intervention in order to improve the following deficits and impairments:  Abnormal gait, Pain, Increased fascial restricitons, Increased muscle spasms, Difficulty walking, Decreased mobility, Decreased strength, Decreased range of motion, Impaired flexibility, Increased edema  Visit Diagnosis: Acute pain of left knee  Stiffness of left knee, not elsewhere classified  Other abnormalities of gait and mobility     Problem List Patient Active Problem List   Diagnosis Date Noted  . Status post total left knee replacement 03/28/2017  . Unilateral primary osteoarthritis, left knee 02/19/2017  . Chronic pain of left knee 02/19/2017  . S/P lumbar laminectomy 12/25/2016  . 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 HTN (hypertension)    . Hypercholesteremia   . Hypothyroidism   . PVC's (premature ventricular contractions)      Laureen Abrahams, PT, DPT 04/24/17  10:55 AM    [facility] [address] [place], [place], [address] Phone: [phone]   Fax:  [phone]  Name: Carlos Juarez MRN: [id number] Date of Birth: Nov 10, 1940

## 2017-04-29 ENCOUNTER — Encounter: Payer: Self-pay | Admitting: Physical Therapy

## 2017-04-29 ENCOUNTER — Ambulatory Visit: Payer: Medicare Other | Admitting: Physical Therapy

## 2017-04-29 DIAGNOSIS — M25662 Stiffness of left knee, not elsewhere classified: Secondary | ICD-10-CM

## 2017-04-29 DIAGNOSIS — M25562 Pain in left knee: Secondary | ICD-10-CM | POA: Diagnosis not present

## 2017-04-29 DIAGNOSIS — R2689 Other abnormalities of gait and mobility: Secondary | ICD-10-CM

## 2017-04-29 NOTE — Therapy (Signed)
[place], [place], [address] Phone: [phone]   Fax:  [phone]  Physical Therapy Treatment  Patient Details  Name: Carlos Juarez MRN: [id number] Date of Birth: Dec 09, 1940 Referring Provider: Dr. Jeronimo Norma   Encounter Date: 04/29/2017  PT End of Session - 04/29/17 1057    Visit Number  5    Number of Visits  16    Date for PT Re-Evaluation  06/12/17    Authorization Type  Medicare/Tricare    PT Start Time  1014    PT Stop Time  1110    PT Time Calculation (min)  56 min    Activity Tolerance  Patient tolerated treatment well    Behavior During Therapy  [facility] for tasks assessed/performed       Past Medical History:  Diagnosis Date  . Arthritis   . Cancer ([place])    skin cancer removed rom nose non melanoma  . HTN (hypertension)   . Hypercholesteremia   . Hypothyroidism   . Pneumonia   . PVC's (premature ventricular contractions)   . Synovial cyst     Past Surgical History:  Procedure Laterality Date  . CARPAL TUNNEL RELEASE    . HERNIA REPAIR     as a child r inguinal  . JOINT REPLACEMENT     TKA Dr. [name] 03/28/17  . LUMBAR LAMINECTOMY/DECOMPRESSION MICRODISCECTOMY N/A 12/25/2016   Procedure: Laminectomy and Foraminotomy - Lumbar two-Lumbar three - Lumbar three-Lumbar four, resection of synovial cyst;  Surgeon: Eustace Moore, MD;  Location: [place];  Service: Neurosurgery;  Laterality: N/A;  . MENISCUS REPAIR Left   . synovial cyst removed     12/25/16  . TONSILLECTOMY    . TOTAL KNEE ARTHROPLASTY Left 03/28/2017   Procedure: LEFT TOTAL KNEE ARTHROPLASTY;  Surgeon: Mcarthur Rossetti, MD;  [facility];  Service: Orthopedics;  Laterality: Left;    There were no vitals filed for this visit.  Subjective Assessment - 04/29/17 1018    Subjective  knee has been a little more achey lately, but otherwise doing well.  working on bike at home.    Patient Stated Goals   improve pain, mobility, ROM    Currently in Pain?  Yes    Pain Score  2     Pain Location  Knee    Pain Orientation  Left    Pain Descriptors / Indicators  Aching    Pain Onset  1 to 4 weeks ago    Pain Frequency  Intermittent    Aggravating Factors   standing, walking, end ranges of knee    Pain Relieving Factors  ice, meds                      [facility] Adult PT Treatment/Exercise - 04/29/17 1020      Ambulation/Gait   Gait Comments  cues in parallel bars with mirror feedback for decreased compensations with mod improvement in gait mechanics      Knee/Hip Exercises: Aerobic   Elliptical  L1 x 2 min    Recumbent Bike  full revolutions x 8 min      Knee/Hip Exercises: Machines for Strengthening   Cybex Knee Extension  2x10; 10#; LLE only with RLE assisted at end range only    Cybex Knee Flexion  2x10; 15# LLE only    Total Gym Leg Press  20# 2x10; LLE only      Knee/Hip Exercises: Standing  Forward Step Up  Left;10 reps;Hand Hold: 1;Step Height: 4" cues for technique      Vasopneumatic   Number Minutes Vasopneumatic   15 minutes    Vasopnuematic Location   Knee    Vasopneumatic Pressure  Medium    Vasopneumatic Temperature   max cold      Manual Therapy   Manual Therapy  Passive ROM    Passive ROM  Lt knee flexion in prone             PT Education - 04/29/17 1056    Education provided  Yes    Education Details  safe gym equipment    Person(s) Educated  Patient    Methods  Explanation;Handout    Comprehension  Verbalized understanding;Returned demonstration       PT Short Term Goals - 04/17/17 1402      PT SHORT TERM GOAL #1   Title  independent with initial HEP    Status  New    Target Date  05/15/17      PT SHORT TERM GOAL #2   Title  improve Lt knee AROM 0-95 degrees for improved function    Status  New    Target Date  05/15/17        PT Long Term Goals - 04/17/17 1403      PT LONG TERM GOAL #1   Title  independent with advanced  HEP    Status  New    Target Date  06/12/17      PT LONG TERM GOAL #2   Title  improve Lt knee AROM 0-110 for improved functional mobility    Status  New    Target Date  06/12/17      PT LONG TERM GOAL #3   Title  amb > 500' on various surfaces without device independently for improved functional mobility    Status  New    Target Date  06/12/17      PT LONG TERM GOAL #4   Title  report pain < 2/10 with exercise and activity for improved function    Status  New    Target Date  06/12/17      PT LONG TERM GOAL #5   Title  perform bowling simulated activities without LOB or increase in pain in order to return to regular hobbies    Status  New    Target Date  06/12/17            Plan - 04/29/17 1057    Clinical Impression Statement  Pt tolerated strengthening exercises well today; flexion still limited to ~ 95 degrees (will measure next session).  Progressing well towards goals.    PT Treatment/Interventions  ADLs/Self Care Home Management;Cryotherapy;Electrical Stimulation;Moist Heat;Therapeutic exercise;Therapeutic activities;Functional mobility training;Stair training;Gait training;DME Instruction;Balance training;Neuromuscular re-education;Patient/family education;Manual techniques;Vasopneumatic Device;Taping;Dry needling;Passive range of motion;Compression bandaging;Scar mobilization    PT Next Visit Plan  bike, elliptical,  aggressive ROM, modalities/manual PRN, hip/ knee strengthening.     Consulted and Agree with Plan of Care  Patient       Patient will benefit from skilled therapeutic intervention in order to improve the following deficits and impairments:  Abnormal gait, Pain, Increased fascial restricitons, Increased muscle spasms, Difficulty walking, Decreased mobility, Decreased strength, Decreased range of motion, Impaired flexibility, Increased edema  Visit Diagnosis: Acute pain of left knee  Stiffness of left knee, not elsewhere classified  Other abnormalities  of gait and mobility     Problem List Patient Active Problem List  Diagnosis Date Noted  . Status post total left knee replacement 03/28/2017  . Unilateral primary osteoarthritis, left knee 02/19/2017  . Chronic pain of left knee 02/19/2017  . S/P lumbar laminectomy 12/25/2016  . 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 HTN (hypertension)   . Hypercholesteremia   . Hypothyroidism   . PVC's (premature ventricular contractions)       Laureen Abrahams, PT, DPT 04/29/17 10:59 AM    [facility] [address] [place], [place], [address] Phone: [phone]   Fax:  [phone]  Name: Carlos Juarez MRN: [id number] Date of Birth: 10-06-1940

## 2017-04-29 NOTE — Patient Instructions (Signed)
   Cardio Equipment   1. Recumbent Bike (for motion)  2. Seated Stepper (use as you feel comfortable; but not necessary)  3. Elliptical (for cardio; start 3-5 min; working up to 20-30 as tolerated)   Machines for Strengthening (Goal is 2-3 sets of 10-15 reps)  1. Knee Extension (10# using Lt leg only)  2. Hamstring Curls (15# using Lt leg only)  3. Leg Press (20# using Lt leg only)

## 2017-05-01 ENCOUNTER — Ambulatory Visit: Payer: Medicare Other | Admitting: Physical Therapy

## 2017-05-01 ENCOUNTER — Encounter: Payer: Self-pay | Admitting: Physical Therapy

## 2017-05-01 DIAGNOSIS — M25562 Pain in left knee: Secondary | ICD-10-CM

## 2017-05-01 DIAGNOSIS — M25662 Stiffness of left knee, not elsewhere classified: Secondary | ICD-10-CM | POA: Diagnosis not present

## 2017-05-01 DIAGNOSIS — R2689 Other abnormalities of gait and mobility: Secondary | ICD-10-CM

## 2017-05-01 NOTE — Therapy (Signed)
[place], [place], [address] Phone: [phone]   Fax:  [phone]  Physical Therapy Treatment  Patient Details  Name: Carlos Juarez MRN: [id number] Date of Birth: [birth year] Referring Provider: Dr. Jeronimo Norma   Encounter Date: 05/01/2017  PT End of Session - 05/01/17 1052    Visit Number  6    Number of Visits  16    Date for PT Re-Evaluation  06/12/17    Authorization Type  Medicare/Tricare    PT Start Time  1015    PT Stop Time  1103    PT Time Calculation (min)  48 min    Activity Tolerance  Patient tolerated treatment well    Behavior During Therapy  [facility] for tasks assessed/performed       Past Medical History:  Diagnosis Date  . Arthritis   . Cancer ([place])    skin cancer removed rom nose non melanoma  . HTN (hypertension)   . Hypercholesteremia   . Hypothyroidism   . Pneumonia   . PVC's (premature ventricular contractions)   . Synovial cyst     Past Surgical History:  Procedure Laterality Date  . CARPAL TUNNEL RELEASE    . HERNIA REPAIR     as a child r inguinal  . JOINT REPLACEMENT     TKA Dr. [name] 03/28/17  . LUMBAR LAMINECTOMY/DECOMPRESSION MICRODISCECTOMY N/A 12/25/2016   Procedure: Laminectomy and Foraminotomy - Lumbar two-Lumbar three - Lumbar three-Lumbar four, resection of synovial cyst;  Surgeon: Eustace Moore, MD;  Location: [place];  Service: Neurosurgery;  Laterality: N/A;  . MENISCUS REPAIR Left   . synovial cyst removed     12/25/16  . TONSILLECTOMY    . TOTAL KNEE ARTHROPLASTY Left 03/28/2017   Procedure: LEFT TOTAL KNEE ARTHROPLASTY;  Surgeon: Mcarthur Rossetti, MD;  [facility];  Service: Orthopedics;  Laterality: Left;    There were no vitals filed for this visit.  Subjective Assessment - 05/01/17 1017    Subjective  knee hurts today; but not bad (rated 2/10)    Patient Stated Goals  improve pain, mobility, ROM    Currently in Pain?   Yes    Pain Score  2     Pain Location  Knee    Pain Orientation  Left    Pain Descriptors / Indicators  Aching    Pain Type  Surgical pain;Acute pain    Pain Onset  1 to 4 weeks ago    Pain Frequency  Intermittent    Aggravating Factors   standing, walking, end ranges of knee    Pain Relieving Factors  ice, meds         [facility] PT Assessment - 05/01/17 1031      AROM   Left Knee Extension  2    Left Knee Flexion  108                  [facility] Adult PT Treatment/Exercise - 05/01/17 1018      Knee/Hip Exercises: Aerobic   Stationary Bike  --    Recumbent Bike  full revolutions x 8 min      Knee/Hip Exercises: Standing   Wall Squat  1 set;10 reps;5 seconds    SLS  RLE marching and hip abduction for LLE SLS x 15 reps      Knee/Hip Exercises: Supine   Heel Slides  AAROM;Left;10 reps with strap and 10 sec hold  Vasopneumatic   Number Minutes Vasopneumatic   15 minutes    Vasopnuematic Location   Knee    Vasopneumatic Pressure  Medium    Vasopneumatic Temperature   max cold      Manual Therapy   Manual Therapy  Passive ROM    Passive ROM  Lt knee extension               PT Short Term Goals - 04/17/17 1402      PT SHORT TERM GOAL #1   Title  independent with initial HEP    Status  New    Target Date  05/15/17      PT SHORT TERM GOAL #2   Title  improve Lt knee AROM 0-95 degrees for improved function    Status  New    Target Date  05/15/17        PT Long Term Goals - 04/17/17 1403      PT LONG TERM GOAL #1   Title  independent with advanced HEP    Status  New    Target Date  06/12/17      PT LONG TERM GOAL #2   Title  improve Lt knee AROM 0-110 for improved functional mobility    Status  New    Target Date  06/12/17      PT LONG TERM GOAL #3   Title  amb > 500' on various surfaces without device independently for improved functional mobility    Status  New    Target Date  06/12/17      PT LONG TERM GOAL #4   Title  report pain <  2/10 with exercise and activity for improved function    Status  New    Target Date  06/12/17      PT LONG TERM GOAL #5   Title  perform bowling simulated activities without LOB or increase in pain in order to return to regular hobbies    Status  New    Target Date  06/12/17            Plan - 05/01/17 1052    Clinical Impression Statement  Pt with improved ROM today with AA heel slides increasing ROM from 98 degrees to 108 degrees.  Progressing well with PT.    PT Treatment/Interventions  ADLs/Self Care Home Management;Cryotherapy;Electrical Stimulation;Moist Heat;Therapeutic exercise;Therapeutic activities;Functional mobility training;Stair training;Gait training;DME Instruction;Balance training;Neuromuscular re-education;Patient/family education;Manual techniques;Vasopneumatic Device;Taping;Dry needling;Passive range of motion;Compression bandaging;Scar mobilization    PT Next Visit Plan  bike, elliptical,  aggressive ROM, modalities/manual PRN, hip/ knee strengthening.     Consulted and Agree with Plan of Care  Patient       Patient will benefit from skilled therapeutic intervention in order to improve the following deficits and impairments:  Abnormal gait, Pain, Increased fascial restricitons, Increased muscle spasms, Difficulty walking, Decreased mobility, Decreased strength, Decreased range of motion, Impaired flexibility, Increased edema  Visit Diagnosis: Acute pain of left knee  Stiffness of left knee, not elsewhere classified  Other abnormalities of gait and mobility     Problem List Patient Active Problem List   Diagnosis Date Noted  . Status post total left knee replacement 03/28/2017  . Unilateral primary osteoarthritis, left knee 02/19/2017  . Chronic pain of left knee 02/19/2017  . S/P lumbar laminectomy 12/25/2016  . 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 HTN (hypertension)   . Hypercholesteremia   . Hypothyroidism   . PVC's (premature ventricular  contractions)  Laureen Abrahams, PT, DPT 05/01/17 10:54 AM    [facility] [address] [place], [place], [address] Phone: [phone]   Fax:  [phone]  Name: Carlos Juarez MRN: [id number] Date of Birth: 1940-12-24

## 2017-05-05 ENCOUNTER — Ambulatory Visit: Payer: Medicare Other | Admitting: Physical Therapy

## 2017-05-05 ENCOUNTER — Encounter: Payer: Self-pay | Admitting: Physical Therapy

## 2017-05-05 DIAGNOSIS — M25562 Pain in left knee: Secondary | ICD-10-CM

## 2017-05-05 DIAGNOSIS — M25662 Stiffness of left knee, not elsewhere classified: Secondary | ICD-10-CM

## 2017-05-05 DIAGNOSIS — R2689 Other abnormalities of gait and mobility: Secondary | ICD-10-CM

## 2017-05-05 NOTE — Therapy (Signed)
[place], [place], [address] Phone: [phone]   Fax:  [phone]  Physical Therapy Treatment  Patient Details  Name: Carlos Juarez MRN: [id number] Date of Birth: 06-Jun-1940 Referring Provider: Dr. Jeronimo Norma   Encounter Date: 05/05/2017  PT End of Session - 05/05/17 1141    Visit Number  7    Number of Visits  16    Date for PT Re-Evaluation  06/12/17    Authorization Type  Medicare/Tricare    PT Start Time  1100    PT Stop Time  1150    PT Time Calculation (min)  50 min    Activity Tolerance  Patient tolerated treatment well    Behavior During Therapy  [facility] for tasks assessed/performed       Past Medical History:  Diagnosis Date  . Arthritis   . Cancer ([place])    skin cancer removed rom nose non melanoma  . HTN (hypertension)   . Hypercholesteremia   . Hypothyroidism   . Pneumonia   . PVC's (premature ventricular contractions)   . Synovial cyst     Past Surgical History:  Procedure Laterality Date  . CARPAL TUNNEL RELEASE    . HERNIA REPAIR     as a child r inguinal  . JOINT REPLACEMENT     TKA Dr. [name] 03/28/17  . LUMBAR LAMINECTOMY/DECOMPRESSION MICRODISCECTOMY N/A 12/25/2016   Procedure: Laminectomy and Foraminotomy - Lumbar two-Lumbar three - Lumbar three-Lumbar four, resection of synovial cyst;  Surgeon: Eustace Moore, MD;  Location: [place];  Service: Neurosurgery;  Laterality: N/A;  . MENISCUS REPAIR Left   . synovial cyst removed     12/25/16  . TONSILLECTOMY    . TOTAL KNEE ARTHROPLASTY Left 03/28/2017   Procedure: LEFT TOTAL KNEE ARTHROPLASTY;  Surgeon: Mcarthur Rossetti, MD;  Location: [facility];  Service: Orthopedics;  Laterality: Left;    There were no vitals filed for this visit.  Subjective Assessment - 05/05/17 1059    Subjective  "I feel like I am doing pretty good today, I did have some soreness and stiffness over the weekend which was controlled  with Ibuprofen"    Currently in Pain?  Yes    Pain Score  2     Pain Location  Knee    Pain Orientation  Left    Pain Descriptors / Indicators  Aching    Pain Type  Surgical pain    Pain Onset  1 to 4 weeks ago    Pain Frequency  Intermittent         [facility] PT Assessment - 05/05/17 1125      AROM   Left Knee Extension  -2    Left Knee Flexion  110                  [facility] Adult PT Treatment/Exercise - 05/05/17 1104      Knee/Hip Exercises: Stretches   Active Hamstring Stretch  2 reps;30 seconds;Left    Quad Stretch  2 reps;30 seconds      Knee/Hip Exercises: Aerobic   Recumbent Bike  L 2 x 6 min full revolutions lowering seating every 2 min to increase knee bending      Knee/Hip Exercises: Machines for Strengthening   Cybex Knee Extension  2x15; 10#; LLE only     Cybex Knee Flexion  2x20; 15# LLE only    Total Gym Leg Press  20# 2x15; LLE only  Knee/Hip Exercises: Standing   Forward Step Up  Left;10 reps;Hand Hold: 1;Step Height: 4"      Knee/Hip Exercises: Supine   Straight Leg Raises  2 sets;10 reps with quad set      Vasopneumatic   Number Minutes Vasopneumatic   10 minutes    Vasopnuematic Location   Knee    Vasopneumatic Pressure  Medium    Vasopneumatic Temperature   max cold      Manual Therapy   Joint Mobilization  grade 3-4 AP in supine with active flexion between, seated PA grade 3-4 with ER to promote screw home mechanism    Passive ROM  forced knee flexion 2 x 10 oscillating into flexion x 5 sec with           Balance Exercises - 05/05/17 1134      Balance Exercises: Standing   Standing Eyes Opened  Narrow base of support (BOS);30 secs;1 rep    Standing Eyes Closed  Narrow base of support (BOS);2 reps;30 secs increased postural sway    Tandem Stance  Eyes open;4 reps;30 secs 2 x with RLE leading and 2 x LLE leading          PT Short Term Goals - 04/17/17 1402      PT SHORT TERM GOAL #1   Title  independent with initial HEP     Status  New    Target Date  05/15/17      PT SHORT TERM GOAL #2   Title  improve Lt knee AROM 0-95 degrees for improved function    Status  New    Target Date  05/15/17        PT Long Term Goals - 04/17/17 1403      PT LONG TERM GOAL #1   Title  independent with advanced HEP    Status  New    Target Date  06/12/17      PT LONG TERM GOAL #2   Title  improve Lt knee AROM 0-110 for improved functional mobility    Status  New    Target Date  06/12/17      PT LONG TERM GOAL #3   Title  amb > 500' on various surfaces without device independently for improved functional mobility    Status  New    Target Date  06/12/17      PT LONG TERM GOAL #4   Title  report pain < 2/10 with exercise and activity for improved function    Status  New    Target Date  06/12/17      PT LONG TERM GOAL #5   Title  perform bowling simulated activities without LOB or increase in pain in order to return to regular hobbies    Status  New    Target Date  06/12/17            Plan - 05/05/17 1142    Clinical Impression Statement  pt continues to progress with ROM increaseing to AROM 110 degrees of exension. continue working on ROM to promote functional mobility and strengthening which he continues to improve. began balance training today which he demonstrated good control in rhomberg but moderate postural sway with static tandem stance. cont vaso post session for swelling.     PT Treatment/Interventions  ADLs/Self Care Home Management;Cryotherapy;Electrical Stimulation;Moist Heat;Therapeutic exercise;Therapeutic activities;Functional mobility training;Stair training;Gait training;DME Instruction;Balance training;Neuromuscular re-education;Patient/family education;Manual techniques;Vasopneumatic Device;Taping;Dry needling;Passive range of motion;Compression bandaging;Scar mobilization    PT Next Visit Plan  bike,  elliptical,  aggressive ROM, modalities/manual PRN, hip/ knee strengthening. if  steri-strips are off teach cross friction, continue strengthening. update HEP PRN.     Consulted and Agree with Plan of Care  Patient       Patient will benefit from skilled therapeutic intervention in order to improve the following deficits and impairments:  Abnormal gait, Pain, Increased fascial restricitons, Increased muscle spasms, Difficulty walking, Decreased mobility, Decreased strength, Decreased range of motion, Impaired flexibility, Increased edema  Visit Diagnosis: Acute pain of left knee  Stiffness of left knee, not elsewhere classified  Other abnormalities of gait and mobility     Problem List Patient Active Problem List   Diagnosis Date Noted  . Status post total left knee replacement 03/28/2017  . Unilateral primary osteoarthritis, left knee 02/19/2017  . Chronic pain of left knee 02/19/2017  . S/P lumbar laminectomy 12/25/2016  . 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 HTN (hypertension)   . Hypercholesteremia   . Hypothyroidism   . PVC's (premature ventricular contractions)    Starr Lake PT, DPT, LAT, ATC  05/05/17  11:45 AM      [facility] [address] [place], [place], [address] Phone: [phone]   Fax:  [phone]  Name: Carlos Juarez MRN: [id number] Date of Birth: [birth year]

## 2017-05-08 ENCOUNTER — Encounter: Payer: Self-pay | Admitting: Physical Therapy

## 2017-05-08 ENCOUNTER — Ambulatory Visit: Payer: Medicare Other | Admitting: Physical Therapy

## 2017-05-08 DIAGNOSIS — M25662 Stiffness of left knee, not elsewhere classified: Secondary | ICD-10-CM

## 2017-05-08 DIAGNOSIS — M25562 Pain in left knee: Secondary | ICD-10-CM | POA: Diagnosis not present

## 2017-05-08 DIAGNOSIS — R2689 Other abnormalities of gait and mobility: Secondary | ICD-10-CM | POA: Diagnosis not present

## 2017-05-08 NOTE — Therapy (Signed)
[place], [place], [address] Phone: [phone]   Fax:  [phone]  Physical Therapy Treatment  Patient Details  Name: Carlos Juarez MRN: [id number] Date of Birth: [birth year] Referring Provider: Dr. Jeronimo Norma   Encounter Date: 05/08/2017  PT End of Session - 05/08/17 1248    Visit Number  8    Number of Visits  16    Date for PT Re-Evaluation  06/12/17    PT Start Time  1105    PT Stop Time  1144    PT Time Calculation (min)  39 min    Activity Tolerance  Patient tolerated treatment well    Behavior During Therapy  [facility] for tasks assessed/performed       Past Medical History:  Diagnosis Date  . Arthritis   . Cancer ([place])    skin cancer removed rom nose non melanoma  . HTN (hypertension)   . Hypercholesteremia   . Hypothyroidism   . Pneumonia   . PVC's (premature ventricular contractions)   . Synovial cyst     Past Surgical History:  Procedure Laterality Date  . CARPAL TUNNEL RELEASE    . HERNIA REPAIR     as a child r inguinal  . JOINT REPLACEMENT     TKA Dr. [name] 03/28/17  . LUMBAR LAMINECTOMY/DECOMPRESSION MICRODISCECTOMY N/A 12/25/2016   Procedure: Laminectomy and Foraminotomy - Lumbar two-Lumbar three - Lumbar three-Lumbar four, resection of synovial cyst;  Surgeon: Eustace Moore, MD;  Location: [place];  Service: Neurosurgery;  Laterality: N/A;  . MENISCUS REPAIR Left   . synovial cyst removed     12/25/16  . TONSILLECTOMY    . TOTAL KNEE ARTHROPLASTY Left 03/28/2017   Procedure: LEFT TOTAL KNEE ARTHROPLASTY;  Surgeon: Mcarthur Rossetti, MD;  [facility];  Service: Orthopedics;  Laterality: Left;    There were no vitals filed for this visit.  Subjective Assessment - 05/08/17 1108    Subjective  "I felt the best it has been in a long time, but then it got really sore yesterday and I had to take some ibuprofen"    Currently in Pain?  Yes    Pain Score  2      Pain Orientation  Left    Pain Descriptors / Indicators  Sore    Pain Onset  1 to 4 weeks ago    Pain Frequency  Intermittent    Aggravating Factors   standing, walking, bending the knee.     Pain Relieving Factors  ice, meds, execise/ stretching         [facility] PT Assessment - 05/08/17 1131      AROM   Left Knee Extension  -8    Left Knee Flexion  110                  [facility] Adult PT Treatment/Exercise - 05/08/17 1111      Knee/Hip Exercises: Stretches   Active Hamstring Stretch  2 reps;30 seconds;Left with strap    Quad Stretch  3 reps;30 seconds in prone with strap      Knee/Hip Exercises: Aerobic   Recumbent Bike  L2 x 6 min lowering seat at 3 min      Knee/Hip Exercises: Standing   Forward Step Up  Left;10 reps;Hand Hold: 1;Step Height: 4"      Knee/Hip Exercises: Seated   Long Arc Quad  2 sets;10 reps 4#    Sit to [employer]  2 sets;10 reps;without UE support with ball between the knees      Manual Therapy   Manual Therapy  Other (comment)    Joint Mobilization  grade 3-4 AP in sitting with legs hangiong of hi-low table active flexion between, seated PA grade 3-4 with ER to promote screw home mechanism    Passive ROM  forced knee flexion 2 x 10 oscillating into flexion x 5 sec with     Other Manual Therapy  ASTYM over the incision and cross friction massag how to perform cross friction massage at home           Balance Exercises - 05/08/17 1245      Balance Exercises: Standing   Standing Eyes Opened  4 reps;30 secs;Narrow base of support (BOS)    SLS  Eyes open;4 reps;10 secs max postural sway with max hold of 10 sec          PT Short Term Goals - 04/17/17 1402      PT SHORT TERM GOAL #1   Title  independent with initial HEP    Status  New    Target Date  05/15/17      PT SHORT TERM GOAL #2   Title  improve Lt knee AROM 0-95 degrees for improved function    Status  New    Target Date  05/15/17        PT Long Term Goals - 04/17/17 1403       PT LONG TERM GOAL #1   Title  independent with advanced HEP    Status  New    Target Date  06/12/17      PT LONG TERM GOAL #2   Title  improve Lt knee AROM 0-110 for improved functional mobility    Status  New    Target Date  06/12/17      PT LONG TERM GOAL #3   Title  amb > 500' on various surfaces without device independently for improved functional mobility    Status  New    Target Date  06/12/17      PT LONG TERM GOAL #4   Title  report pain < 2/10 with exercise and activity for improved function    Status  New    Target Date  06/12/17      PT LONG TERM GOAL #5   Title  perform bowling simulated activities without LOB or increase in pain in order to return to regular hobbies    Status  New    Target Date  06/12/17            Plan - 05/08/17 1248    Clinical Impression Statement  pt continues to demo improvement of knee mobility from 110 degrees to -8 of extension. Conitnued working on knee ROM with mobs and hip / knee strength which he is progressing well. Educated how to perofrm cross friction massage techniques and how to perform at home.  post session he reported decreased pain and declined modalities.     Rehab Potential  Good    PT Frequency  2x / week    PT Treatment/Interventions  ADLs/Self Care Home Management;Cryotherapy;Electrical Stimulation;Moist Heat;Therapeutic exercise;Therapeutic activities;Functional mobility training;Stair training;Gait training;DME Instruction;Balance training;Neuromuscular re-education;Patient/family education;Manual techniques;Vasopneumatic Device;Taping;Dry needling;Passive range of motion;Compression bandaging;Scar mobilization    PT Next Visit Plan  bike, elliptical,  aggressive ROM, modalities/manual PRN, hip/ knee strengthening. continue strengthening. update HEP PRN.     PT Home Exercise Plan  cross friction massage  Consulted and Agree with Plan of Care  Patient       Patient will benefit from skilled therapeutic  intervention in order to improve the following deficits and impairments:  Abnormal gait, Pain, Increased fascial restricitons, Increased muscle spasms, Difficulty walking, Decreased mobility, Decreased strength, Decreased range of motion, Impaired flexibility, Increased edema  Visit Diagnosis: Acute pain of left knee  Stiffness of left knee, not elsewhere classified  Other abnormalities of gait and mobility     Problem List Patient Active Problem List   Diagnosis Date Noted  . Status post total left knee replacement 03/28/2017  . Unilateral primary osteoarthritis, left knee 02/19/2017  . Chronic pain of left knee 02/19/2017  . S/P lumbar laminectomy 12/25/2016  . 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 HTN (hypertension)   . Hypercholesteremia   . Hypothyroidism   . PVC's (premature ventricular contractions)    Starr Lake PT, DPT, LAT, ATC  05/08/17  12:56 PM       [facility] [address] [place], [place], [address] Phone: [phone]   Fax:  [phone]  Name: BINH DOTEN MRN: [id number] Date of Birth: September 30, 1940

## 2017-05-12 ENCOUNTER — Encounter (INDEPENDENT_AMBULATORY_CARE_PROVIDER_SITE_OTHER): Payer: Self-pay | Admitting: Orthopaedic Surgery

## 2017-05-12 ENCOUNTER — Ambulatory Visit (INDEPENDENT_AMBULATORY_CARE_PROVIDER_SITE_OTHER): Payer: Medicare Other | Admitting: Orthopaedic Surgery

## 2017-05-12 DIAGNOSIS — Z96652 Presence of left artificial knee joint: Secondary | ICD-10-CM

## 2017-05-12 MED ORDER — HYDROCODONE-ACETAMINOPHEN 5-325 MG PO TABS: 1.0000 | ORAL_TABLET | Freq: Three times a day (TID) | ORAL | 0 refills | Status: DC | PRN

## 2017-05-12 NOTE — Progress Notes (Signed)
The patient is now 6-7 weeks status post a left total knee arthroplasty.  He has about 1-2 more weeks of physical therapy left knee is doing well.  He also has a Physiological scientist.  He is having trouble sleeping at night.  He is been on some oxycodone and he does have naproxen.  On exam he almost has full extension of his left knee.  His flexion is to about 100 degrees.  The knee feels ligaments are stable.  There is swelling to be expected but no evidence of significant effusion which is more bony swelling from the surgery itself.  Incisions well-healed.  At this point will continue increase his activities.  We did send in hydrocodone to take as needed at night and this will wean him from the oxycodone.  He can also take naproxen at night.  He understands the ramifications of narcotics and has been counseled about this.  We will see him back in 4 weeks he is doing overall but no x-rays are needed.

## 2017-05-13 ENCOUNTER — Encounter: Payer: Self-pay | Admitting: Physical Therapy

## 2017-05-13 ENCOUNTER — Ambulatory Visit: Payer: Medicare Other | Admitting: Physical Therapy

## 2017-05-13 DIAGNOSIS — M25662 Stiffness of left knee, not elsewhere classified: Secondary | ICD-10-CM

## 2017-05-13 DIAGNOSIS — M25562 Pain in left knee: Secondary | ICD-10-CM

## 2017-05-13 DIAGNOSIS — R2689 Other abnormalities of gait and mobility: Secondary | ICD-10-CM

## 2017-05-13 NOTE — Therapy (Signed)
[place], [place], [address] Phone: [phone]   Fax:  [phone]  Physical Therapy Treatment  Patient Details  Name: Carlos Juarez MRN: [id number] Date of Birth: 23-Apr-1940 Referring Provider: Dr. Jeronimo Juarez   Encounter Date: 05/13/2017  PT End of Session - 05/13/17 1148    Visit Number  9    Number of Visits  16    Date for PT Re-Evaluation  06/12/17    PT Start Time  1101    PT Stop Time  1155    PT Time Calculation (min)  54 min    Activity Tolerance  Patient tolerated treatment well    Behavior During Therapy  [facility] for tasks assessed/performed       Past Medical History:  Diagnosis Date  . Arthritis   . Cancer ([place])    skin cancer removed rom nose non melanoma  . HTN (hypertension)   . Hypercholesteremia   . Hypothyroidism   . Pneumonia   . PVC's (premature ventricular contractions)   . Synovial cyst     Past Surgical History:  Procedure Laterality Date  . CARPAL TUNNEL RELEASE    . HERNIA REPAIR     as a child r inguinal  . JOINT REPLACEMENT     TKA Dr. Ninfa Juarez 03/28/17  . LUMBAR LAMINECTOMY/DECOMPRESSION MICRODISCECTOMY N/A 12/25/2016   Procedure: Laminectomy and Foraminotomy - Lumbar two-Lumbar three - Lumbar three-Lumbar four, resection of synovial cyst;  Surgeon: Carlos Moore, MD;  Location: [place];  Service: Neurosurgery;  Laterality: N/A;  . MENISCUS REPAIR Left   . synovial cyst removed     12/25/16  . TONSILLECTOMY    . TOTAL KNEE ARTHROPLASTY Left 03/28/2017   Procedure: LEFT TOTAL KNEE ARTHROPLASTY;  Surgeon: Carlos Rossetti, MD;  [facility];  Service: Orthopedics;  Laterality: Left;    There were no vitals filed for this visit.  Subjective Assessment - 05/13/17 1106    Subjective  "I am feeling alittle more sore and tight today.  I saw the Md and he was pleased with my progress"    Currently in Pain?  Yes    Pain Score  3     Pain  Orientation  Left    Pain Descriptors / Indicators  Sore    Pain Onset  1 to 4 weeks ago    Pain Frequency  Intermittent                      [facility] Adult PT Treatment/Exercise - 05/13/17 1109      Knee/Hip Exercises: Stretches   Active Hamstring Stretch  2 reps;30 seconds    Quad Stretch  2 reps;30 seconds      Knee/Hip Exercises: Aerobic   Elliptical  L1 x 3 min elevation L1    Recumbent Bike  L2 x 6 min lower seat every 2 min to increase knee flexion      Knee/Hip Exercises: Machines for Strengthening   Cybex Knee Flexion  2x20; 25# LLE only    Total Gym Leg Press  --      Knee/Hip Exercises: Standing   Heel Raises  2 sets;20 reps    Forward Step Up  2 sets;Step Height: 6"    Wall Squat  10 reps;2 sets 5  wall sits holding 20 sec       Knee/Hip Exercises: Seated   Long Arc Quad  2 sets;15 reps 5# holding for 2 sec  with controlled eccentric lowering      Vasopneumatic   Number Minutes Vasopneumatic   10 minutes    Vasopnuematic Location   Knee    Vasopneumatic Pressure  Medium    Vasopneumatic Temperature   max cold      Manual Therapy   Joint Mobilization  grade 3-4 AP in sitting with legs hangiong of hi-low table active flexion between, seated PA grade 3-4 with ER to promote screw home mechanism          Balance Exercises - 05/13/17 1112      Balance Exercises: Standing   SLS  Eyes open;3 reps;30 secs intermittent mod postural sway    Tandem Gait  4 reps;Forward;Retro in //          PT Short Term Goals - 04/17/17 1402      PT SHORT TERM GOAL #1   Title  independent with initial HEP    Status  New    Target Date  05/15/17      PT SHORT TERM GOAL #2   Title  improve Lt knee AROM 0-95 degrees for improved function    Status  New    Target Date  05/15/17        PT Long Term Goals - 04/17/17 1403      PT LONG TERM GOAL #1   Title  independent with advanced HEP    Status  New    Target Date  06/12/17      PT LONG TERM GOAL #2    Title  improve Lt knee AROM 0-110 for improved functional mobility    Status  New    Target Date  06/12/17      PT LONG TERM GOAL #3   Title  amb > 500' on various surfaces without device independently for improved functional mobility    Status  New    Target Date  06/12/17      PT LONG TERM GOAL #4   Title  report pain < 2/10 with exercise and activity for improved function    Status  New    Target Date  06/12/17      PT LONG TERM GOAL #5   Title  perform bowling simulated activities without LOB or increase in pain in order to return to regular hobbies    Status  New    Target Date  06/12/17            Plan - 05/13/17 1158    Clinical Impression Statement  Carlos Juarez continues to progress with PT. He saw his MD which he reported is pleased with his current level of function. continued working on knee mobility and progressing knee strengthening and balance training activities which he performed well and reported improvement of pain following todays session. continued vaso for pain and inflammation post session.     PT Treatment/Interventions  ADLs/Self Care Home Management;Cryotherapy;Electrical Stimulation;Moist Heat;Therapeutic exercise;Therapeutic activities;Functional mobility training;Stair training;Gait training;DME Instruction;Balance training;Neuromuscular re-education;Patient/family education;Manual techniques;Vasopneumatic Device;Taping;Dry needling;Passive range of motion;Compression bandaging;Scar mobilization    PT Next Visit Plan  bike, elliptical,  aggressive ROM, modalities/manual PRN, hip/ knee strengthening. continue strengthening. update HEP PRN.     PT Home Exercise Plan  cross friction massage    Consulted and Agree with Plan of Care  Patient       Patient will benefit from skilled therapeutic intervention in order to improve the following deficits and impairments:  Abnormal gait, Pain, Increased fascial restricitons, Increased muscle spasms, Difficulty  walking, Decreased mobility, Decreased strength, Decreased range of motion, Impaired flexibility, Increased edema  Visit Diagnosis: Acute pain of left knee  Stiffness of left knee, not elsewhere classified  Other abnormalities of gait and mobility     Problem List Patient Active Problem List   Diagnosis Date Noted  . Status post total left knee replacement 03/28/2017  . Unilateral primary osteoarthritis, left knee 02/19/2017  . Chronic pain of left knee 02/19/2017  . S/P lumbar laminectomy 12/25/2016  . 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 HTN (hypertension)   . Hypercholesteremia   . Hypothyroidism   . PVC's (premature ventricular contractions)    Starr Lake PT, DPT, LAT, ATC  05/13/17  12:01 PM       [facility] [address] [place], [place], [address] Phone: [phone]   Fax:  [phone]  Name: Carlos Juarez MRN: [id number] Date of Birth: 01/26/1941

## 2017-05-15 ENCOUNTER — Encounter: Payer: Self-pay | Admitting: Physical Therapy

## 2017-05-15 ENCOUNTER — Ambulatory Visit: Payer: Medicare Other | Admitting: Physical Therapy

## 2017-05-15 DIAGNOSIS — M25662 Stiffness of left knee, not elsewhere classified: Secondary | ICD-10-CM

## 2017-05-15 DIAGNOSIS — M25562 Pain in left knee: Secondary | ICD-10-CM

## 2017-05-15 DIAGNOSIS — R2689 Other abnormalities of gait and mobility: Secondary | ICD-10-CM

## 2017-05-15 NOTE — Therapy (Signed)
[place], [place], [address] Phone: [phone]   Fax:  [phone]  Physical Therapy Treatment  Patient Details  Name: Carlos Juarez MRN: [id number] Date of Birth: 09-24-1940 Referring Provider: Dr. Jeronimo Norma   Encounter Date: 05/15/2017  PT End of Session - 05/15/17 1105    Visit Number  10    Number of Visits  16    Date for PT Re-Evaluation  06/12/17    PT Start Time  1100    PT Stop Time  1150    PT Time Calculation (min)  50 min    Activity Tolerance  Patient tolerated treatment well    Behavior During Therapy  [facility] for tasks assessed/performed       Past Medical History:  Diagnosis Date  . Arthritis   . Cancer ([place])    skin cancer removed rom nose non melanoma  . HTN (hypertension)   . Hypercholesteremia   . Hypothyroidism   . Pneumonia   . PVC's (premature ventricular contractions)   . Synovial cyst     Past Surgical History:  Procedure Laterality Date  . CARPAL TUNNEL RELEASE    . HERNIA REPAIR     as a child r inguinal  . JOINT REPLACEMENT     TKA Dr. [name] 03/28/17  . LUMBAR LAMINECTOMY/DECOMPRESSION MICRODISCECTOMY N/A 12/25/2016   Procedure: Laminectomy and Foraminotomy - Lumbar two-Lumbar three - Lumbar three-Lumbar four, resection of synovial cyst;  Surgeon: Eustace Moore, MD;  Location: [place];  Service: Neurosurgery;  Laterality: N/A;  . MENISCUS REPAIR Left   . synovial cyst removed     12/25/16  . TONSILLECTOMY    . TOTAL KNEE ARTHROPLASTY Left 03/28/2017   Procedure: LEFT TOTAL KNEE ARTHROPLASTY;  Surgeon: Mcarthur Rossetti, MD;  Location: [facility];  Service: Orthopedics;  Laterality: Left;    There were no vitals filed for this visit.  Subjective Assessment - 05/15/17 1059    Subjective  "I feeling pretty good today, I did have to use medication to calm down pain to sleeping"     Currently in Pain?  No/denies         [facility] PT Assessment -  05/15/17 0001      AROM   Left Knee Extension  -4    Left Knee Flexion  115                  [facility] Adult PT Treatment/Exercise - 05/15/17 1108      Knee/Hip Exercises: Stretches   Active Hamstring Stretch  2 reps;30 seconds    Quad Stretch  2 reps;Left;30 seconds prone with strap      Knee/Hip Exercises: Aerobic   Elliptical  L3 x 4 min elevation L2    Recumbent Bike  L4 x 6 min lowering seat every 2 min to increase flexion      Knee/Hip Exercises: Machines for Strengthening   Cybex Knee Extension  2x15; 20#; up with both, down with LLE only    Cybex Knee Flexion  2x20; 25# LLE only    Cybex Leg Press  2 x 15 40# bil,  1 set using bil LE for concentric/ eccentric, and 1 x pushing with both and lowering with LLE only      Knee/Hip Exercises: Standing   Step Down  Step Height: 6";2 sets;10 reps continues to fatigue into the second set to 5-6 reps    Wall Squat  10 reps;1 set holding wall  squat/ sit for 10 sec      Knee/Hip Exercises: Supine   Quad Sets  2 sets;10 reps with overpressure      Manual Therapy   Joint Mobilization  grade 3-4 AP in sitting with legs hangiong of hi-low table active flexion between, seated PA grade 3-4 with ER to promote screw home mechanism    Passive ROM  forced knee flexion 2 x 10 oscillating into flexion x 5 sec with           Balance Exercises - 05/15/17 1152      Balance Exercises: Standing   SLS  Eyes open;4 reps;30 secs in //    Tandem Gait  Forward;Retro;4 reps in //          PT Short Term Goals - 04/17/17 1402      PT SHORT TERM GOAL #1   Title  independent with initial HEP    Status  New    Target Date  05/15/17      PT SHORT TERM GOAL #2   Title  improve Lt knee AROM 0-95 degrees for improved function    Status  New    Target Date  05/15/17        PT Long Term Goals - 04/17/17 1403      PT LONG TERM GOAL #1   Title  independent with advanced HEP    Status  New    Target Date  06/12/17      PT LONG  TERM GOAL #2   Title  improve Lt knee AROM 0-110 for improved functional mobility    Status  New    Target Date  06/12/17      PT LONG TERM GOAL #3   Title  amb > 500' on various surfaces without device independently for improved functional mobility    Status  New    Target Date  06/12/17      PT LONG TERM GOAL #4   Title  report pain < 2/10 with exercise and activity for improved function    Status  New    Target Date  06/12/17      PT LONG TERM GOAL #5   Title  perform bowling simulated activities without LOB or increase in pain in order to return to regular hobbies    Status  New    Target Date  06/12/17            Plan - 05/15/17 1201    Clinical Impression Statement  improved knee ROM 115 to -4 degrees today. Continued mobs to promote knee ROM and strengthening progressing weight and continued eccentric focus for LLE which he is performing well. He does fatigue quckily on step down exercise but reports no pain. pt is progressing with tandem balance but has moderate difficulty maintaining SLS balance on LLE.     PT Treatment/Interventions  ADLs/Self Care Home Management;Cryotherapy;Electrical Stimulation;Moist Heat;Therapeutic exercise;Therapeutic activities;Functional mobility training;Stair training;Gait training;DME Instruction;Balance training;Neuromuscular re-education;Patient/family education;Manual techniques;Vasopneumatic Device;Taping;Dry needling;Passive range of motion;Compression bandaging;Scar mobilization    PT Next Visit Plan  bike, elliptical,  aggressive ROM, modalities/manual PRN, hip/ knee strengthening. continue strengthening. update HEP PRN.     PT Home Exercise Plan  cross friction massage, corner balance    Consulted and Agree with Plan of Care  Patient       Patient will benefit from skilled therapeutic intervention in order to improve the following deficits and impairments:  Abnormal gait, Pain, Increased fascial restricitons, Increased muscle spasms,  Difficulty walking, Decreased mobility, Decreased strength, Decreased range of motion, Impaired flexibility, Increased edema  Visit Diagnosis: Acute pain of left knee  Stiffness of left knee, not elsewhere classified  Other abnormalities of gait and mobility     Problem List Patient Active Problem List   Diagnosis Date Noted  . Status post total left knee replacement 03/28/2017  . Unilateral primary osteoarthritis, left knee 02/19/2017  . Chronic pain of left knee 02/19/2017  . S/P lumbar laminectomy 12/25/2016  . 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 HTN (hypertension)   . Hypercholesteremia   . Hypothyroidism   . PVC's (premature ventricular contractions)    Starr Lake PT, DPT, LAT, ATC  05/15/17  12:04 PM      [facility] [address] [place], [place], [address] Phone: [phone]   Fax:  [phone]  Name: Carlos Juarez MRN: [id number] Date of Birth: 02-21-1941

## 2017-05-20 ENCOUNTER — Ambulatory Visit: Payer: Medicare Other | Attending: Orthopaedic Surgery | Admitting: Physical Therapy

## 2017-05-20 ENCOUNTER — Encounter: Payer: Self-pay | Admitting: Physical Therapy

## 2017-05-20 DIAGNOSIS — R2689 Other abnormalities of gait and mobility: Secondary | ICD-10-CM | POA: Diagnosis not present

## 2017-05-20 DIAGNOSIS — M25662 Stiffness of left knee, not elsewhere classified: Secondary | ICD-10-CM | POA: Insufficient documentation

## 2017-05-20 DIAGNOSIS — M25562 Pain in left knee: Secondary | ICD-10-CM | POA: Diagnosis not present

## 2017-05-20 NOTE — Therapy (Signed)
[place], [place], [address] Phone: [phone]   Fax:  [phone]  Physical Therapy Treatment  Patient Details  Name: Carlos Juarez MRN: [id number] Date of Birth: [birth year] Referring Provider: Dr. Jeronimo Norma   Encounter Date: 05/20/2017  PT End of Session - 05/20/17 0939    Visit Number  11    Number of Visits  16    Date for PT Re-Evaluation  06/12/17    Authorization Type  Medicare/Tricare    PT Start Time  0932    PT Stop Time  1013    PT Time Calculation (min)  41 min    Activity Tolerance  Patient tolerated treatment well    Behavior During Therapy  [facility] for tasks assessed/performed       Past Medical History:  Diagnosis Date  . Arthritis   . Cancer ([place])    skin cancer removed rom nose non melanoma  . HTN (hypertension)   . Hypercholesteremia   . Hypothyroidism   . Pneumonia   . PVC's (premature ventricular contractions)   . Synovial cyst     Past Surgical History:  Procedure Laterality Date  . CARPAL TUNNEL RELEASE    . HERNIA REPAIR     as a child r inguinal  . JOINT REPLACEMENT     TKA Dr. [name] 03/28/17  . LUMBAR LAMINECTOMY/DECOMPRESSION MICRODISCECTOMY N/A 12/25/2016   Procedure: Laminectomy and Foraminotomy - Lumbar two-Lumbar three - Lumbar three-Lumbar four, resection of synovial cyst;  Surgeon: Eustace Moore, MD;  Location: [facility];  Service: Neurosurgery;  Laterality: N/A;  . MENISCUS REPAIR Left   . synovial cyst removed     12/25/16  . TONSILLECTOMY    . TOTAL KNEE ARTHROPLASTY Left 03/28/2017   Procedure: LEFT TOTAL KNEE ARTHROPLASTY;  Surgeon: Mcarthur Rossetti, MD;  [facility];  Service: Orthopedics;  Laterality: Left;    There were no vitals filed for this visit.  Subjective Assessment - 05/20/17 0938    Subjective  "I am only having only a little bit of soreness in the knee"    Currently in Pain?  Yes    Pain Score  1     Pain  Orientation  Left    Pain Descriptors / Indicators  Sore    Pain Onset  1 to 4 weeks ago    Aggravating Factors   deep bending    Pain Relieving Factors  ice, meds, exercise/ stretching         [facility] PT Assessment - 05/20/17 0952      AROM   Left Knee Flexion  118 120 following manual                  [facility] Adult PT Treatment/Exercise - 05/20/17 0940      Knee/Hip Exercises: Stretches   Active Hamstring Stretch  2 reps;30 seconds    Quad Stretch  2 reps;Left;30 seconds prone with strap    Gastroc Stretch  2 reps;30 seconds on slant board      Knee/Hip Exercises: Aerobic   Elliptical  L5 x 65min elevation L3    Recumbent Bike  L5 x 5 min      Knee/Hip Exercises: Machines for Strengthening   Cybex Knee Extension  2x15; 20#; up with both, down with LLE only    Cybex Knee Flexion  2x20; 35# LLE only    Cybex Leg Press  --      Knee/Hip Exercises: Standing  Lateral Step Up  Step Height: 6";10 reps;2 sets;Both    Step Down  Step Height: 6";2 sets;15 reps    Stairs  4 x using 6 inch step utilizing reciprocal pattern.  dicsussed he no longer has to use up with good, down with ba      Manual Therapy   Passive ROM  forced knee flexion 2 x 10 oscillating into flexion x 5 sec with           Balance Exercises - 05/20/17 1017      Balance Exercises: Standing   SLS  Eyes open;4 reps;30 secs;Eyes closed 2 x EO, 2 x EC    Tandem Gait  Forward;Retro;4 reps          PT Short Term Goals - 04/17/17 1402      PT SHORT TERM GOAL #1   Title  independent with initial HEP    Status  New    Target Date  05/15/17      PT SHORT TERM GOAL #2   Title  improve Lt knee AROM 0-95 degrees for improved function    Status  New    Target Date  05/15/17        PT Long Term Goals - 04/17/17 1403      PT LONG TERM GOAL #1   Title  independent with advanced HEP    Status  New    Target Date  06/12/17      PT LONG TERM GOAL #2   Title  improve Lt knee AROM 0-110 for  improved functional mobility    Status  New    Target Date  06/12/17      PT LONG TERM GOAL #3   Title  amb > 500' on various surfaces without device independently for improved functional mobility    Status  New    Target Date  06/12/17      PT LONG TERM GOAL #4   Title  report pain < 2/10 with exercise and activity for improved function    Status  New    Target Date  06/12/17      PT LONG TERM GOAL #5   Title  perform bowling simulated activities without LOB or increase in pain in order to return to regular hobbies    Status  New    Target Date  06/12/17            Plan - 05/20/17 1013    Clinical Impression Statement  pt continues to improve with 120 degrees of flexion today. continued working on knee strengthening and balance utilizing SLS which he continues to demo mod postural sway. plan to continue for the next 2-3 visits to finalize HEP and work toward goals. pt declined modalities post session.     PT Treatment/Interventions  ADLs/Self Care Home Management;Cryotherapy;Electrical Stimulation;Moist Heat;Therapeutic exercise;Therapeutic activities;Functional mobility training;Stair training;Gait training;DME Instruction;Balance training;Neuromuscular re-education;Patient/family education;Manual techniques;Vasopneumatic Device;Taping;Dry needling;Passive range of motion;Compression bandaging;Scar mobilization    PT Next Visit Plan  bike, elliptical,  aggressive ROM, modalities/manual PRN, hip/ knee strengthening. continue strengthening. update HEP PRN.     PT Home Exercise Plan  cross friction massage, corner balance    Consulted and Agree with Plan of Care  Patient       Patient will benefit from skilled therapeutic intervention in order to improve the following deficits and impairments:  Abnormal gait, Pain, Increased fascial restricitons, Increased muscle spasms, Difficulty walking, Decreased mobility, Decreased strength, Decreased range of motion, Impaired flexibility,  Increased edema  Visit Diagnosis: Acute pain of left knee  Stiffness of left knee, not elsewhere classified  Other abnormalities of gait and mobility     Problem List Patient Active Problem List   Diagnosis Date Noted  . Status post total left knee replacement 03/28/2017  . Unilateral primary osteoarthritis, left knee 02/19/2017  . Chronic pain of left knee 02/19/2017  . S/P lumbar laminectomy 12/25/2016  . 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 HTN (hypertension)   . Hypercholesteremia   . Hypothyroidism   . PVC's (premature ventricular contractions)    Starr Lake PT, DPT, LAT, ATC  05/20/17  10:18 AM      [facility] [address] [place], [place], [address] Phone: [phone]   Fax:  [phone]  Name: Carlos Juarez MRN: [id number] Date of Birth: 1940-07-31

## 2017-05-23 ENCOUNTER — Encounter: Payer: Self-pay | Admitting: Physical Therapy

## 2017-05-23 ENCOUNTER — Ambulatory Visit: Payer: Medicare Other | Admitting: Physical Therapy

## 2017-05-23 DIAGNOSIS — R2689 Other abnormalities of gait and mobility: Secondary | ICD-10-CM

## 2017-05-23 DIAGNOSIS — M25562 Pain in left knee: Secondary | ICD-10-CM

## 2017-05-23 DIAGNOSIS — M25662 Stiffness of left knee, not elsewhere classified: Secondary | ICD-10-CM | POA: Diagnosis not present

## 2017-05-23 NOTE — Therapy (Signed)
[place], [place], [address] Phone: [phone]   Fax:  [phone]  Physical Therapy Treatment  Patient Details  Name: Carlos Juarez MRN: [id number] Date of Birth: October 07, 1940 Referring Provider: Dr. Jeronimo Norma   Encounter Date: 05/23/2017  PT End of Session - 05/23/17 0927    Visit Number  12    Number of Visits  16    Date for PT Re-Evaluation  06/12/17    PT Start Time  0929    PT Stop Time  1009    PT Time Calculation (min)  40 min    Activity Tolerance  Patient tolerated treatment well    Behavior During Therapy  [facility] for tasks assessed/performed       Past Medical History:  Diagnosis Date  . Arthritis   . Cancer ([place])    skin cancer removed rom nose non melanoma  . HTN (hypertension)   . Hypercholesteremia   . Hypothyroidism   . Pneumonia   . PVC's (premature ventricular contractions)   . Synovial cyst     Past Surgical History:  Procedure Laterality Date  . CARPAL TUNNEL RELEASE    . HERNIA REPAIR     as a child r inguinal  . JOINT REPLACEMENT     TKA Dr. [name] 03/28/17  . LUMBAR LAMINECTOMY/DECOMPRESSION MICRODISCECTOMY N/A 12/25/2016   Procedure: Laminectomy and Foraminotomy - Lumbar two-Lumbar three - Lumbar three-Lumbar four, resection of synovial cyst;  Surgeon: Eustace Moore, MD;  Location: [facility];  Service: Neurosurgery;  Laterality: N/A;  . MENISCUS REPAIR Left   . synovial cyst removed     12/25/16  . TONSILLECTOMY    . TOTAL KNEE ARTHROPLASTY Left 03/28/2017   Procedure: LEFT TOTAL KNEE ARTHROPLASTY;  Surgeon: Mcarthur Rossetti, MD;  [facility];  Service: Orthopedics;  Laterality: Left;    There were no vitals filed for this visit.  Subjective Assessment - 05/23/17 0933    Subjective  "I am feeling better, the achiness is much better than it was"    Currently in Pain?  Yes    Pain Score  1     Pain Orientation  Left    Pain Type  Surgical  pain    Pain Onset  More than a month ago    Pain Frequency  Intermittent                      [facility] Adult PT Treatment/Exercise - 05/23/17 0935      Knee/Hip Exercises: Stretches   Quad Stretch  2 reps;30 seconds with foot on step      Knee/Hip Exercises: Aerobic   Elliptical  L5 x 67min elevation L3    Recumbent Bike  L5 x 4 min      Knee/Hip Exercises: Machines for Strengthening   Cybex Knee Extension  2x15; 20#; LLE only    Cybex Knee Flexion  2 x 15; 25# LLE only    Cybex Leg Press  2 x 15 40# LLE only      Knee/Hip Exercises: Standing   Other Standing Knee Exercises  lateral band walks 5 x in //    Other Standing Knee Exercises  standing marching in place iwth yellow loop band around ankles 2 x 15          Balance Exercises - 05/23/17 1004      Balance Exercises: Standing   SLS  Eyes open;4 reps;30 secs;Eyes closed  Tandem Gait  Forward;Retro;4 reps in //          PT Short Term Goals - 04/17/17 1402      PT SHORT TERM GOAL #1   Title  independent with initial HEP    Status  New    Target Date  05/15/17      PT SHORT TERM GOAL #2   Title  improve Lt knee AROM 0-95 degrees for improved function    Status  New    Target Date  05/15/17        PT Long Term Goals - 04/17/17 1403      PT LONG TERM GOAL #1   Title  independent with advanced HEP    Status  New    Target Date  06/12/17      PT LONG TERM GOAL #2   Title  improve Lt knee AROM 0-110 for improved functional mobility    Status  New    Target Date  06/12/17      PT LONG TERM GOAL #3   Title  amb > 500' on various surfaces without device independently for improved functional mobility    Status  New    Target Date  06/12/17      PT LONG TERM GOAL #4   Title  report pain < 2/10 with exercise and activity for improved function    Status  New    Target Date  06/12/17      PT LONG TERM GOAL #5   Title  perform bowling simulated activities without LOB or increase in pain in  order to return to regular hobbies    Status  New    Target Date  06/12/17            Plan - 05/23/17 1012    Clinical Impression Statement  continued with exercise progressiong utilizing only LLE during liftin exercise which he tolerated well with minimal report of soreness. continued balance training SLS continuing with progression with eyes closed. Discussed benefits of adding that to his balance exercise at home in the corner for safety.     PT Next Visit Plan  bike, elliptical,  aggressive ROM, modalities/manual PRN, hip/ knee strengthening. continue strengthening. update HEP PRN.     PT Home Exercise Plan  cross friction massage, corner balance    Consulted and Agree with Plan of Care  Patient       Patient will benefit from skilled therapeutic intervention in order to improve the following deficits and impairments:     Visit Diagnosis: Acute pain of left knee  Stiffness of left knee, not elsewhere classified  Other abnormalities of gait and mobility     Problem List Patient Active Problem List   Diagnosis Date Noted  . Status post total left knee replacement 03/28/2017  . Unilateral primary osteoarthritis, left knee 02/19/2017  . Chronic pain of left knee 02/19/2017  . S/P lumbar laminectomy 12/25/2016  . 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 HTN (hypertension)   . Hypercholesteremia   . Hypothyroidism   . PVC's (premature ventricular contractions)    Starr Lake PT, DPT, LAT, ATC  05/23/17  10:17 AM      [facility] [address] [place], [place], [address] Phone: [phone]   Fax:  [phone]  Name: Carlos Juarez MRN: [id number] Date of Birth: 02/07/1941

## 2017-05-27 ENCOUNTER — Encounter: Payer: Self-pay | Admitting: Physical Therapy

## 2017-05-27 ENCOUNTER — Ambulatory Visit: Payer: Medicare Other | Admitting: Physical Therapy

## 2017-05-27 DIAGNOSIS — M25562 Pain in left knee: Secondary | ICD-10-CM

## 2017-05-27 DIAGNOSIS — R2689 Other abnormalities of gait and mobility: Secondary | ICD-10-CM

## 2017-05-27 DIAGNOSIS — M25662 Stiffness of left knee, not elsewhere classified: Secondary | ICD-10-CM | POA: Diagnosis not present

## 2017-05-27 NOTE — Therapy (Signed)
[place], [place], [address] Phone: [phone]   Fax:  [phone]  Physical Therapy Treatment / Discharged Summary   Patient Details  Name: Carlos Juarez MRN: [id number] Date of Birth: 24-Aug-1940 Referring Provider: Dr. Jeronimo Norma   Encounter Date: 05/27/2017  PT End of Session - 05/27/17 1157    Visit Number  13    Number of Visits  16    Date for PT Re-Evaluation  06/12/17    PT Start Time  6440    PT Stop Time  1223    PT Time Calculation (min)  38 min    Activity Tolerance  Patient tolerated treatment well    Behavior During Therapy  [facility] for tasks assessed/performed       Past Medical History:  Diagnosis Date  . Arthritis   . Cancer ([place])    skin cancer removed rom nose non melanoma  . HTN (hypertension)   . Hypercholesteremia   . Hypothyroidism   . Pneumonia   . PVC's (premature ventricular contractions)   . Synovial cyst     Past Surgical History:  Procedure Laterality Date  . CARPAL TUNNEL RELEASE    . HERNIA REPAIR     as a child r inguinal  . JOINT REPLACEMENT     TKA Dr. [name] 03/28/17  . LUMBAR LAMINECTOMY/DECOMPRESSION MICRODISCECTOMY N/A 12/25/2016   Procedure: Laminectomy and Foraminotomy - Lumbar two-Lumbar three - Lumbar three-Lumbar four, resection of synovial cyst;  Surgeon: Eustace Moore, MD;  Location: [facility];  Service: Neurosurgery;  Laterality: N/A;  . MENISCUS REPAIR Left   . synovial cyst removed     12/25/16  . TONSILLECTOMY    . TOTAL KNEE ARTHROPLASTY Left 03/28/2017   Procedure: LEFT TOTAL KNEE ARTHROPLASTY;  Surgeon: Mcarthur Rossetti, MD;  [facility]: [facility];  Service: Orthopedics;  Laterality: Left;    There were no vitals filed for this visit.  Subjective Assessment - 05/27/17 1152    Subjective  " I have to look down periodically to check if my knee is still there, doing pretty good".    Currently in Pain?  No/denies    Aggravating  Factors   deep bending    Pain Relieving Factors  ice, meds, exercise/ stretching         [facility] PT Assessment - 05/27/17 1205      Observation/Other Assessments   Focus on Therapeutic Outcomes (FOTO)   21% limited      AROM   Left Knee Extension  3    Left Knee Flexion  120      Strength   Left Knee Flexion  4+/5    Left Knee Extension  4+/5                  [facility] Adult PT Treatment/Exercise - 05/27/17 1152      Therapeutic Activites    Therapeutic Activities  Other Therapeutic Activities    Other Therapeutic Activities  mimicked bowling with 8lb bowling ball x 10 and was able to maintan balance and reported no pain.       Knee/Hip Exercises: Stretches   Active Hamstring Stretch  2 reps;30 seconds    Quad Stretch  2 reps;30 seconds      Knee/Hip Exercises: Aerobic   Elliptical  L5 x 7 min elevation L3             PT Education - 05/27/17 1235    Education provided  Yes    Education Details  reviewed previously provided HEP and use of gym equipment. progression of strengthing to promote endurance and function. gradually increasing with bowling to prevent fatigue    Person(s) Educated  Patient    Methods  Explanation;Verbal cues    Comprehension  Verbalized understanding;Verbal cues required       PT Short Term Goals - 05/27/17 1208      PT SHORT TERM GOAL #1   Title  independent with initial HEP    Status  Achieved      PT SHORT TERM GOAL #2   Title  improve Lt knee AROM 0-95 degrees for improved function    Baseline  knee extension to 3 degrees    Period  Weeks    Status  Partially Met        PT Long Term Goals - 05/27/17 1208      PT LONG TERM GOAL #2   Title  improve Lt knee AROM 0-110 for improved functional mobility    Baseline  due to 3 - 120    Status  Partially Met      PT LONG TERM GOAL #3   Title  amb > 500' on various surfaces without device independently for improved functional mobility    Status  Achieved      PT LONG  TERM GOAL #4   Title  report pain < 2/10 with exercise and activity for improved function    Status  Achieved      PT LONG TERM GOAL #5   Title  perform bowling simulated activities without LOB or increase in pain in order to return to regular hobbies    Status  Achieved            Plan - 05/27/17 1228    Clinical Impression Statement  Carlos Juarez has made great progress with physical therapy improving both knee mobility and strength. Additionally he reports no pain, and was able to perform all exrcise and mimicked bowling activities with LOB. He met or partially met all goals today. He is able to maintain and progress his current level of function independently and will be discharged from PT today.     PT Next Visit Plan  D/C    PT Home Exercise Plan  cross friction massage, corner balance    Consulted and Agree with Plan of Care  Patient       Patient will benefit from skilled therapeutic intervention in order to improve the following deficits and impairments:  Abnormal gait, Pain, Increased fascial restricitons, Increased muscle spasms, Difficulty walking, Decreased mobility, Decreased strength, Decreased range of motion, Impaired flexibility, Increased edema  Visit Diagnosis: Acute pain of left knee  Stiffness of left knee, not elsewhere classified  Other abnormalities of gait and mobility     Problem List Patient Active Problem List   Diagnosis Date Noted  . Status post total left knee replacement 03/28/2017  . Unilateral primary osteoarthritis, left knee 02/19/2017  . Chronic pain of left knee 02/19/2017  . S/P lumbar laminectomy 12/25/2016  . 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 HTN (hypertension)   . Hypercholesteremia   . Hypothyroidism   . PVC's (premature ventricular contractions)    Starr Lake PT, DPT, LAT, ATC  05/27/17  12:36 PM      [facility] [address] [place], [place],  [address] Phone: [phone]   Fax:  [phone]  Name: Carlos Juarez MRN: [id number] Date of Birth:  11-05-1940     PHYSICAL THERAPY DISCHARGE SUMMARY  Visits from Start of Care: 13  Current functional level related to goals / functional outcomes: See goals / FOTO 21% limited   Remaining deficits: Intermittent stiffness noted in the R knee especially following prolonged sitting/ standing.    Education / Equipment: HEP, theraband, posture, balance training, anatomy of the knee.   Plan: Patient agrees to discharge.  Patient goals were partially met. Patient is being discharged due to being pleased with the current functional level.  ?????          Chloeann Alfred PT, DPT, LAT, ATC  05/27/17  12:37 PM

## 2017-05-29 ENCOUNTER — Ambulatory Visit: Payer: Medicare Other | Admitting: Physical Therapy

## 2017-06-03 ENCOUNTER — Encounter: Payer: Medicare Other | Admitting: Physical Therapy

## 2017-06-05 ENCOUNTER — Encounter: Payer: Medicare Other | Admitting: Physical Therapy

## 2017-06-09 ENCOUNTER — Other Ambulatory Visit (INDEPENDENT_AMBULATORY_CARE_PROVIDER_SITE_OTHER): Payer: Self-pay

## 2017-06-09 ENCOUNTER — Ambulatory Visit (INDEPENDENT_AMBULATORY_CARE_PROVIDER_SITE_OTHER): Payer: Medicare Other | Admitting: Orthopaedic Surgery

## 2017-06-09 ENCOUNTER — Encounter (INDEPENDENT_AMBULATORY_CARE_PROVIDER_SITE_OTHER): Payer: Self-pay | Admitting: Orthopaedic Surgery

## 2017-06-09 DIAGNOSIS — G5602 Carpal tunnel syndrome, left upper limb: Secondary | ICD-10-CM

## 2017-06-09 DIAGNOSIS — M65331 Trigger finger, right middle finger: Secondary | ICD-10-CM | POA: Insufficient documentation

## 2017-06-09 DIAGNOSIS — M7989 Other specified soft tissue disorders: Secondary | ICD-10-CM

## 2017-06-09 DIAGNOSIS — Z96652 Presence of left artificial knee joint: Secondary | ICD-10-CM | POA: Diagnosis not present

## 2017-06-09 DIAGNOSIS — M79605 Pain in left leg: Secondary | ICD-10-CM

## 2017-06-09 MED ORDER — LIDOCAINE HCL 1 % IJ SOLN
1.0000 mL | INTRAMUSCULAR | Status: AC | PRN
Start: 2017-06-09 — End: 2017-06-09
  Administered 2017-06-09: 1 mL

## 2017-06-09 MED ORDER — METHYLPREDNISOLONE ACETATE 40 MG/ML IJ SUSP
40.0000 mg | INTRAMUSCULAR | Status: AC | PRN
  Administered 2017-06-09: 40 mg

## 2017-06-09 NOTE — Progress Notes (Signed)
Office Visit Note   Patient: Carlos Juarez           Date of Birth: 1940/09/22           MRN: [id number] Visit Date: 06/09/2017              Requested by: Dorothyann Peng, NP [place], [place] [address] PCP: Dorothyann Peng, NP   Assessment & Plan: Visit Diagnoses:  1. Status post total left knee replacement   2. Trigger finger, right middle finger   3. Carpal tunnel syndrome, left upper limb     Plan: I do feel he needs a Doppler ultrasound of his left lower extremity rule out DVT based on what he is feeling.  Also wanted to provide an injection of the transverse carpal ligament on the left in the middle finger trigger finger on the right.  He tolerated these injections well.  We will see him back in 5 weeks see how he is doing overall and if the DVT screen is positive we will start him on Xarelto and he understands that as well.  Right now we will work on setting of that ultrasound and let him know the results.  When I do see him back in 5 weeks I would like an AP and lateral of the left operative knee.  Follow-Up Instructions: Return in about 5 weeks (around 07/14/2017).   Orders:  Orders Placed This Encounter  Procedures  . Hand/UE Inj  . Hand/UE Inj   No orders of the defined types were placed in this encounter.     Procedures: Hand/UE Inj: R long A1 for trigger finger on 06/09/2017 2:14 PM Medications: 1 mL lidocaine 1 %  Hand/UE Inj: L carpal tunnel for carpal tunnel syndrome on 06/09/2017 2:14 PM Medications: 1 mL lidocaine 1 %; 40 mg methylPREDNISolone acetate 40 MG/ML      Clinical Data: No additional findings.   Subjective: Chief Complaint  Patient presents with  . Left Knee - Follow-up  Patient is a very pleasant 77 year old who is 10 weeks out from a left total knee arthroplasty.  He also has symptoms of carpal tunnel syndrome of his left upper extremity that he wants evaluated today he has had a previous right carpal tunnel release.   He also has right middle finger trigger finger.  He is an avid bowler.  He has reported swelling though in his left foot and ankle on the operative side where he had his total knee replacement 10 weeks ago.  He says range of motion is excellent.  He can palpate a mass in the back of his calf which is been somewhat painful.  He does have numbness and tingling that does wake him up at night and his left wrist and he is using a wrist splint already.  Again he had previous right carpal tunnel surgery but has active triggering of his middle finger and he like to have this injected today.  HPI  Review of Systems He currently denies any headache, chest pain, shortness of breath, fever, chills, nausea, vomiting.  Objective: Vital Signs: There were no vitals taken for this visit.  Physical Exam He is alert and oriented x3 and in no acute distress Ortho Exam Examination of his left knee shows a well-healed surgical incision.  There is still just a mild effusion.  His extension is full and his flexion on the left side is 120 degrees.  His ankle and foot are swollen.  He does  have pain in his calf and a small palpable mass or cord in the calf area.  This is slightly painful.  Examination of his right hand shows a well-healed surgical incision from carpal tunnel surgery.  He does have pain over the middle finger A1 pulley with active triggering.  Examination of his left hand shows no muscle atrophy.  He does havea positive Phalen's and Tinel's exam on the left side which is only mild. Specialty Comments:  No specialty comments available.  Imaging: No results found.   PMFS History: Patient Active Problem List   Diagnosis Date Noted  . Trigger finger, right middle finger 06/09/2017  . Carpal tunnel syndrome, left upper limb 06/09/2017  . Status post total left knee replacement 03/28/2017  . Unilateral primary osteoarthritis, left knee 02/19/2017  . Chronic pain of left knee 02/19/2017  . S/P lumbar  laminectomy 12/25/2016  . 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 HTN (hypertension)   . Hypercholesteremia   . Hypothyroidism   . PVC's (premature ventricular contractions)    Past Medical History:  Diagnosis Date  . Arthritis   . Cancer ([place])    skin cancer removed rom nose non melanoma  . HTN (hypertension)   . Hypercholesteremia   . Hypothyroidism   . Pneumonia   . PVC's (premature ventricular contractions)   . Synovial cyst     Family History  Problem Relation Age of Onset  . Hypertension Mother   . Heart disease Father   . Heart attack Father 34    Past Surgical History:  Procedure Laterality Date  . CARPAL TUNNEL RELEASE    . HERNIA REPAIR     as a child r inguinal  . JOINT REPLACEMENT     TKA Dr. [name] 03/28/17  . LUMBAR LAMINECTOMY/DECOMPRESSION MICRODISCECTOMY N/A 12/25/2016   Procedure: Laminectomy and Foraminotomy - Lumbar two-Lumbar three - Lumbar three-Lumbar four, resection of synovial cyst;  Surgeon: Eustace Moore, MD;  Location: [facility];  Service: Neurosurgery;  Laterality: N/A;  . MENISCUS REPAIR Left   . synovial cyst removed     12/25/16  . TONSILLECTOMY    . TOTAL KNEE ARTHROPLASTY Left 03/28/2017   Procedure: LEFT TOTAL KNEE ARTHROPLASTY;  Surgeon: Mcarthur Rossetti, MD;  [facility];  Service: Orthopedics;  Laterality: Left;   Social History   Occupational History  . Not on file  Tobacco Use  . Smoking status: Former Smoker    Packs/day: 1.00    Years: 15.00    Pack years: 15.00    Last attempt to quit: 07/15/1974    Years since quitting: 42.9  . Smokeless tobacco: Never Used  . Tobacco comment: smoked heavy 15 years and then quit  Substance and Sexual Activity  . Alcohol use: Yes    Alcohol/week: 0.0 oz    Comment: Wine with meals at times   . Drug use: No  . Sexual activity: Yes

## 2017-06-10 ENCOUNTER — Encounter: Payer: Medicare Other | Admitting: Physical Therapy

## 2017-06-10 ENCOUNTER — Ambulatory Visit (HOSPITAL_COMMUNITY)
Admission: RE | Admit: 2017-06-10 | Discharge: 2017-06-10 | Disposition: A | Discharge status: Home / Self Care | Payer: Medicare Other | Source: Ambulatory Visit | Attending: Orthopaedic Surgery | Admitting: Orthopaedic Surgery

## 2017-06-10 ENCOUNTER — Telehealth (INDEPENDENT_AMBULATORY_CARE_PROVIDER_SITE_OTHER): Payer: Self-pay

## 2017-06-10 DIAGNOSIS — M79605 Pain in left leg: Secondary | ICD-10-CM

## 2017-06-10 DIAGNOSIS — M7989 Other specified soft tissue disorders: Secondary | ICD-10-CM

## 2017-06-10 DIAGNOSIS — Z96652 Presence of left artificial knee joint: Secondary | ICD-10-CM | POA: Diagnosis not present

## 2017-06-10 NOTE — Progress Notes (Signed)
Left lower extremity venous duplex has been completed. Negative for DVT. Results were given to Autumn at Dr. Trevor Mace office.  06/10/17 9:17 AM Carlos Levering RVT

## 2017-06-10 NOTE — Telephone Encounter (Signed)
See below

## 2017-06-10 NOTE — Telephone Encounter (Signed)
Call report to advise that pt is negative for DVT LLE

## 2017-06-12 ENCOUNTER — Encounter: Payer: Medicare Other | Admitting: Physical Therapy

## 2017-07-14 ENCOUNTER — Ambulatory Visit (INDEPENDENT_AMBULATORY_CARE_PROVIDER_SITE_OTHER): Payer: Medicare Other | Admitting: Orthopaedic Surgery

## 2017-07-14 ENCOUNTER — Ambulatory Visit (INDEPENDENT_AMBULATORY_CARE_PROVIDER_SITE_OTHER): Payer: Medicare Other

## 2017-07-14 ENCOUNTER — Encounter (INDEPENDENT_AMBULATORY_CARE_PROVIDER_SITE_OTHER): Payer: Self-pay | Admitting: Orthopaedic Surgery

## 2017-07-14 DIAGNOSIS — Z96652 Presence of left artificial knee joint: Secondary | ICD-10-CM | POA: Diagnosis not present

## 2017-07-14 DIAGNOSIS — M1711 Unilateral primary osteoarthritis, right knee: Secondary | ICD-10-CM

## 2017-07-14 MED ORDER — METHYLPREDNISOLONE ACETATE 40 MG/ML IJ SUSP
40.0000 mg | INTRAMUSCULAR | Status: AC | PRN
  Administered 2017-07-14: 40 mg via INTRA_ARTICULAR

## 2017-07-14 MED ORDER — LIDOCAINE HCL 1 % IJ SOLN
3.0000 mL | INTRAMUSCULAR | Status: AC | PRN
Start: 2017-07-14 — End: 2017-07-14
  Administered 2017-07-14: 3 mL

## 2017-07-14 NOTE — Progress Notes (Signed)
Office Visit Note   Patient: Carlos Juarez           Date of Birth: 07-Sep-1940           MRN: [id number] Visit Date: 07/14/2017              Requested by: Dorothyann Peng, NP [place], [place] [address] PCP: Dorothyann Peng, NP   Assessment & Plan: Visit Diagnoses:  1. History of left knee replacement   2. Unilateral primary osteoarthritis, right knee     Plan: I agree with trying a steroid injection in his right knee today since he is having on a trip today.  His left knee is doing well.  As far as his right knee goes he was to consider knee replacement sometime in July.  I would like to see him back in a month to see how he is doing overall and see if he would like [place] to still pursue a right total knee arthroplasty in the near future.  All questions concerns were answered and addressed.  Follow-Up Instructions: Return in about 1 month (around 08/11/2017).   Orders:  Orders Placed This Encounter  Procedures  . Large Joint Inj  . XR Knee 1-2 Views Left   No orders of the defined types were placed in this encounter.     Procedures: Large Joint Inj: R knee on 07/14/2017 8:52 AM Indications: diagnostic evaluation and pain Details: 22 G 1.5 in needle, superolateral approach  Arthrogram: No  Medications: 3 mL lidocaine 1 %; 40 mg methylPREDNISolone acetate 40 MG/ML Outcome: tolerated well, no immediate complications Procedure, treatment alternatives, risks and benefits explained, specific risks discussed. Consent was given by the patient. Immediately prior to procedure a time out was called to verify the correct patient, procedure, equipment, support staff and site/side marked as required. Patient was prepped and draped in the usual sterile fashion.       Clinical Data: No additional findings.   Subjective: Chief Complaint  Patient presents with  . Left Knee - Follow-up  The patient is now 108 days status post a left total knee arthroplasty.  He  says that knee is doing great.  He would like to have a steroid injection in his right knee though.  He has known osteoarthritis of the right knee and like to consider knee replacement potentially sometime this summer on the right side.  He has no complaints with his left knee though.  HPI  Review of Systems He currently denies any headache, chest pain, shortness of breath, fever, chills, nausea, vomiting.  Objective: Vital Signs: There were no vitals taken for this visit.  Physical Exam He is alert and oriented x3 and in no acute distress Ortho Exam Examination of his left operative knee shows a well-healed surgical incision.  There is only mild swelling.  His range of motion is full.  The knee feels ligamentously stable.  Examination of his right knee shows some slight varus malalignment.  There is medial joint line tenderness as well as some lateral tenderness and patellofemoral crepitation.  His right knee is ligamentously stable as is his left knee. Specialty Comments:  No specialty comments available.  Imaging: Xr Knee 1-2 Views Left  Result Date: 07/14/2017 2 views of the left knee show well-seated total knee arthroplasty with no complicating features.    PMFS History: Patient Active Problem List   Diagnosis Date Noted  . Unilateral primary osteoarthritis, right knee 07/14/2017  . History of left  knee replacement 07/14/2017  . Trigger finger, right middle finger 06/09/2017  . Carpal tunnel syndrome, left upper limb 06/09/2017  . Status post total left knee replacement 03/28/2017  . Unilateral primary osteoarthritis, left knee 02/19/2017  . Chronic pain of left knee 02/19/2017  . S/P lumbar laminectomy 12/25/2016  . 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 HTN (hypertension)   . Hypercholesteremia   . Hypothyroidism   . PVC's (premature ventricular contractions)    Past Medical History:  Diagnosis Date  . Arthritis   . Cancer ([place])    skin cancer removed rom nose non  melanoma  . HTN (hypertension)   . Hypercholesteremia   . Hypothyroidism   . Pneumonia   . PVC's (premature ventricular contractions)   . Synovial cyst     Family History  Problem Relation Age of Onset  . Hypertension Mother   . Heart disease Father   . Heart attack Father 62    Past Surgical History:  Procedure Laterality Date  . CARPAL TUNNEL RELEASE    . HERNIA REPAIR     as a child r inguinal  . JOINT REPLACEMENT     TKA Dr. [name] 03/28/17  . LUMBAR LAMINECTOMY/DECOMPRESSION MICRODISCECTOMY N/A 12/25/2016   Procedure: Laminectomy and Foraminotomy - Lumbar two-Lumbar three - Lumbar three-Lumbar four, resection of synovial cyst;  Surgeon: Eustace Moore, MD;  Location: [facility];  Service: Neurosurgery;  Laterality: N/A;  . MENISCUS REPAIR Left   . synovial cyst removed     12/25/16  . TONSILLECTOMY    . TOTAL KNEE ARTHROPLASTY Left 03/28/2017   Procedure: LEFT TOTAL KNEE ARTHROPLASTY;  Surgeon: Mcarthur Rossetti, MD;  [facility];  Service: Orthopedics;  Laterality: Left;   Social History   Occupational History  . Not on file  Tobacco Use  . Smoking status: Former Smoker    Packs/day: 1.00    Years: 15.00    Pack years: 15.00    Last attempt to quit: 07/15/1974    Years since quitting: 43.0  . Smokeless tobacco: Never Used  . Tobacco comment: smoked heavy 15 years and then quit  Substance and Sexual Activity  . Alcohol use: Yes    Alcohol/week: 0.0 oz    Comment: Wine with meals at times   . Drug use: No  . Sexual activity: Yes

## 2017-08-11 DIAGNOSIS — H0100A Unspecified blepharitis right eye, upper and lower eyelids: Secondary | ICD-10-CM | POA: Diagnosis not present

## 2017-08-11 DIAGNOSIS — H43813 Vitreous degeneration, bilateral: Secondary | ICD-10-CM | POA: Diagnosis not present

## 2017-08-11 DIAGNOSIS — H25813 Combined forms of age-related cataract, bilateral: Secondary | ICD-10-CM | POA: Diagnosis not present

## 2017-08-11 DIAGNOSIS — H52203 Unspecified astigmatism, bilateral: Secondary | ICD-10-CM | POA: Diagnosis not present

## 2017-08-12 DIAGNOSIS — Z85828 Personal history of other malignant neoplasm of skin: Secondary | ICD-10-CM | POA: Diagnosis not present

## 2017-08-12 DIAGNOSIS — D2262 Melanocytic nevi of left upper limb, including shoulder: Secondary | ICD-10-CM | POA: Diagnosis not present

## 2017-08-12 DIAGNOSIS — L723 Sebaceous cyst: Secondary | ICD-10-CM | POA: Diagnosis not present

## 2017-08-12 DIAGNOSIS — L720 Epidermal cyst: Secondary | ICD-10-CM | POA: Diagnosis not present

## 2017-08-12 DIAGNOSIS — L821 Other seborrheic keratosis: Secondary | ICD-10-CM | POA: Diagnosis not present

## 2017-08-12 DIAGNOSIS — D2261 Melanocytic nevi of right upper limb, including shoulder: Secondary | ICD-10-CM | POA: Diagnosis not present

## 2017-08-12 DIAGNOSIS — B078 Other viral warts: Secondary | ICD-10-CM | POA: Diagnosis not present

## 2017-08-12 DIAGNOSIS — D1801 Hemangioma of skin and subcutaneous tissue: Secondary | ICD-10-CM | POA: Diagnosis not present

## 2017-08-12 DIAGNOSIS — D225 Melanocytic nevi of trunk: Secondary | ICD-10-CM | POA: Diagnosis not present

## 2017-08-13 ENCOUNTER — Encounter (INDEPENDENT_AMBULATORY_CARE_PROVIDER_SITE_OTHER): Payer: Self-pay | Admitting: Orthopaedic Surgery

## 2017-08-13 ENCOUNTER — Ambulatory Visit (INDEPENDENT_AMBULATORY_CARE_PROVIDER_SITE_OTHER): Payer: Medicare Other | Admitting: Orthopaedic Surgery

## 2017-08-13 DIAGNOSIS — Z96652 Presence of left artificial knee joint: Secondary | ICD-10-CM | POA: Diagnosis not present

## 2017-08-13 DIAGNOSIS — M1711 Unilateral primary osteoarthritis, right knee: Secondary | ICD-10-CM

## 2017-08-13 NOTE — Progress Notes (Signed)
Office Visit Note   Patient: Carlos Juarez           Date of Birth: [birth year]           MRN: [id number] Visit Date: 08/13/2017              Requested by: Dorothyann Peng, NP [place],  [address] PCP: Dorothyann Peng, NP   Assessment & Plan: Visit Diagnoses:  1. History of left knee replacement   2. Unilateral primary osteoarthritis, right knee     Plan: I would like to have this done sometime after July 4.  He understands the risk and benefits of the surgery having had a done just 5 months ago on his left side.  He understands his intraoperative and postoperative course and what all this involves.  All questions concerns were answered and addressed.  We will be in touch with him about surgical dates in the future.  Follow-Up Instructions: No follow-ups on file.   Orders:  No orders of the defined types were placed in this encounter.  No orders of the defined types were placed in this encounter.     Procedures: No procedures performed   Clinical Data: No additional findings.   Subjective: Chief Complaint  Patient presents with  . Right Knee - Follow-up  Patient is well-known to me.  He is a very active 77 year old gentleman who is now 5 months status post a left total knee arthroplasty.  He is very pleased with that knee replacement.  He has known and well-documented right knee osteoarthritis and degenerative joint disease.  He is already tried injections in that knee and is having problems going up and down stairs and the pain is returned.  The injection did help for a while.  Given the fact that he does have significant also arthritis of the right knee and given the success of his left knee he does wish to proceed with a right total knee arthroplasty this summer.  HPI  Review of Systems There currently no active medical issues systemically.  Objective: Vital Signs: There were no vitals taken for this visit.  Physical Exam He is alert  and oriented no acute distress Ortho Exam Examination of his left operative knee shows a well-healed surgical incision.  There is still a mild effusion but his range of motion is almost completely full.  The knee is ligamentously stable.  His right knee she is no significant effusion today.  There is patellofemoral crepitation and medial joint line tenderness as well. Specialty Comments:  No specialty comments available.  Imaging: No results found. We did look at his previous right knee x-rays and it does show tricompartmental arthritic changes withnarrowing as well as particular osteophytes and sclerotic changes.  The overall alignment is just slightly varus.  At this point he does wish to proceed with a right total knee arthroplasty  PMFS History: Patient Active Problem List   Diagnosis Date Noted  . Unilateral primary osteoarthritis, right knee 07/14/2017  . History of left knee replacement 07/14/2017  . Trigger finger, right middle finger 06/09/2017  . Carpal tunnel syndrome, left upper limb 06/09/2017  . Status post total left knee replacement 03/28/2017  . Unilateral primary osteoarthritis, left knee 02/19/2017  . Chronic pain of left knee 02/19/2017  . S/P lumbar laminectomy 12/25/2016  . 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 HTN (hypertension)   . Hypercholesteremia   . Hypothyroidism   . PVC's (premature ventricular contractions)    Past  Medical History:  Diagnosis Date  . Arthritis   . Cancer ([place])    skin cancer removed rom nose non melanoma  . HTN (hypertension)   . Hypercholesteremia   . Hypothyroidism   . Pneumonia   . PVC's (premature ventricular contractions)   . Synovial cyst     Family History  Problem Relation Age of Onset  . Hypertension Mother   . Heart disease Father   . Heart attack Father 2    Past Surgical History:  Procedure Laterality Date  . CARPAL TUNNEL RELEASE    . HERNIA REPAIR     as a child r inguinal  . JOINT REPLACEMENT     TKA Dr.  [name] 03/28/17  . LUMBAR LAMINECTOMY/DECOMPRESSION MICRODISCECTOMY N/A 12/25/2016   Procedure: Laminectomy and Foraminotomy - Lumbar two-Lumbar three - Lumbar three-Lumbar four, resection of synovial cyst;  Surgeon: Eustace Moore, MD;  Location: [facility];  Service: Neurosurgery;  Laterality: N/A;  . MENISCUS REPAIR Left   . synovial cyst removed     12/25/16  . TONSILLECTOMY    . TOTAL KNEE ARTHROPLASTY Left 03/28/2017   Procedure: LEFT TOTAL KNEE ARTHROPLASTY;  Surgeon: Mcarthur Rossetti, MD;  [facility];  Service: Orthopedics;  Laterality: Left;   Social History   Occupational History  . Not on file  Tobacco Use  . Smoking status: Former Smoker    Packs/day: 1.00    Years: 15.00    Pack years: 15.00    Last attempt to quit: 07/15/1974    Years since quitting: 43.1  . Smokeless tobacco: Never Used  . Tobacco comment: smoked heavy 15 years and then quit  Substance and Sexual Activity  . Alcohol use: Yes    Alcohol/week: 0.0 oz    Comment: Wine with meals at times   . Drug use: No  . Sexual activity: Yes

## 2017-08-22 ENCOUNTER — Telehealth (INDEPENDENT_AMBULATORY_CARE_PROVIDER_SITE_OTHER): Payer: Self-pay | Admitting: Orthopaedic Surgery

## 2017-08-22 MED ORDER — NAPROXEN 500 MG PO TABS: 500.0000 mg | ORAL_TABLET | Freq: Two times a day (BID) | ORAL | 0 refills | Status: DC | PRN

## 2017-08-22 NOTE — Telephone Encounter (Signed)
Patient called requesting an RX refill on his Naproxen 500mg .  Patient uses [facility].  CB#[phone].  Thank you.

## 2017-08-22 NOTE — Telephone Encounter (Signed)
I sent it in 

## 2017-08-22 NOTE — Telephone Encounter (Signed)
IC patient and advised.  

## 2017-08-22 NOTE — Telephone Encounter (Signed)
Ok to refill 

## 2017-09-19 ENCOUNTER — Ambulatory Visit: Payer: Medicare Other

## 2017-09-24 ENCOUNTER — Other Ambulatory Visit (INDEPENDENT_AMBULATORY_CARE_PROVIDER_SITE_OTHER): Payer: Self-pay | Admitting: Physician Assistant

## 2017-09-24 NOTE — Pre-Procedure Instructions (Signed)
Carlos Juarez  09/24/2017      [place], [place] [address] Phone: [phone] Fax: [phone]  [facility] [place], [place] [address] Phone: [phone] Fax: [phone]  [facility] [place], [place] [address] [place] [address] Phone: [phone] Fax: [phone]    Your procedure is scheduled on June 25  Report to [place] [facility] [place].M.  Call this number if you have problems the morning of surgery:  [phone]   Remember:  NOTHING TO EAT OR DRINK AFTER MIDNIGHT  Continue all medications as directed by your physician except follow these medication instructions before surgery below    Take these medicines the morning of surgery with A SIP OF WATER  atenolol (TENORMIN) levothyroxine (SYNTHROID, LEVOTHROID) methocarbamol (ROBAXIN)  7 days prior to surgery STOP taking any Aspirin(unless otherwise instructed by your surgeon), Aleve, Naproxen, Ibuprofen, Motrin, Advil, Goody's, BC's, all herbal medications, fish oil, and all vitamins  Follow your doctors instructions regarding your Aspirin.  If no instructions were given by your doctor, then you will need to call the prescribing office office to get instructions.        Do not wear jewelry  Do not wear lotions, powders, or cologne, or deodorant.  Men may shave face and neck.  Do not bring valuables to the hospital.  [facility] is not responsible for any belongings or valuables.  Contacts, dentures or bridgework may not be worn into surgery.  Leave your suitcase in the car.  After surgery it may be brought to your room.  For patients admitted to the hospital, discharge time will be determined by your treatment team.  Patients discharged the day of surgery will not be  allowed to drive home.    Special instructions:   [facility]- Preparing For Surgery  Before surgery, you can play an important role. Because skin is not sterile, your skin needs to be as free of germs as possible. You can reduce the number of germs on your skin by washing with CHG (chlorahexidine gluconate) Soap before surgery.  CHG is an antiseptic cleaner which kills germs and bonds with the skin to continue killing germs even after washing.    Oral Hygiene is also important to reduce your risk of infection.  Remember - BRUSH YOUR TEETH THE MORNING OF SURGERY WITH YOUR REGULAR TOOTHPASTE  Please do not use if you have an allergy to CHG or antibacterial soaps. If your skin becomes reddened/irritated stop using the CHG.  Do not shave (including legs and underarms) for at least 48 hours prior to first CHG shower. It is OK to shave your face.  Please follow these instructions carefully.   1. Shower the NIGHT BEFORE SURGERY and the MORNING OF SURGERY with CHG.   2. If you chose to wash your hair, wash your hair first as usual with your normal shampoo.  3. After you shampoo, rinse your hair and body thoroughly to remove the shampoo.  4. Use CHG as you would any other liquid soap. You can apply CHG directly to the skin and wash gently with a scrungie or a clean washcloth.   5. Apply the CHG Soap to your body ONLY FROM THE NECK DOWN.  Do not use on open  wounds or open sores. Avoid contact with your eyes, ears, mouth and genitals (private parts). Wash Face and genitals (private parts)  with your normal soap.  6. Wash thoroughly, paying special attention to the area where your surgery will be performed.  7. Thoroughly rinse your body with warm water from the neck down.  8. DO NOT shower/wash with your normal soap after using and rinsing off the CHG Soap.  9. Pat yourself dry with a CLEAN TOWEL.  10. Wear CLEAN PAJAMAS to bed the night before surgery, wear comfortable clothes the morning of  surgery  11. Place CLEAN SHEETS on your bed the night of your first shower and DO NOT SLEEP WITH PETS.    Day of Surgery:  Do not apply any deodorants/lotions.  Please wear clean clothes to the hospital/surgery center.   Remember to brush your teeth WITH YOUR REGULAR TOOTHPASTE.    Please read over the following fact sheets that you were given.

## 2017-09-25 ENCOUNTER — Encounter (HOSPITAL_COMMUNITY)
Admission: RE | Admit: 2017-09-25 | Discharge: 2017-09-25 | Disposition: A | Discharge status: Home / Self Care | Payer: Medicare Other | Source: Ambulatory Visit | Attending: Orthopaedic Surgery | Admitting: Orthopaedic Surgery

## 2017-09-25 ENCOUNTER — Encounter (HOSPITAL_COMMUNITY): Payer: Self-pay

## 2017-09-25 ENCOUNTER — Other Ambulatory Visit: Payer: Self-pay

## 2017-09-25 DIAGNOSIS — Z01812 Encounter for preprocedural laboratory examination: Secondary | ICD-10-CM | POA: Insufficient documentation

## 2017-09-25 DIAGNOSIS — M1711 Unilateral primary osteoarthritis, right knee: Secondary | ICD-10-CM | POA: Diagnosis not present

## 2017-09-25 LAB — SURGICAL PCR SCREEN
MRSA, PCR: NEGATIVE
Staphylococcus aureus: NEGATIVE

## 2017-09-25 LAB — BASIC METABOLIC PANEL
Anion gap: 7 (ref 5–15)
BUN: 16 mg/dL (ref 6–20)
CHLORIDE: 104 mmol/L (ref 101–111)
CO2: 30 mmol/L (ref 22–32)
CREATININE: 1.14 mg/dL (ref 0.61–1.24)
Calcium: 9 mg/dL (ref 8.9–10.3)
GFR calc Af Amer: >60 mL/min (ref >60)
GFR calc non Af Amer: >60 mL/min (ref >60)
GLUCOSE: 137 mg/dL — AB (ref 65–99)
POTASSIUM: 3.8 mmol/L (ref 3.5–5.1)
SODIUM: 141 mmol/L (ref 135–145)

## 2017-09-25 LAB — CBC
HEMATOCRIT: 49.5 % (ref 39.0–52.0)
Hemoglobin: 16.8 g/dL (ref 13.0–17.0)
MCH: 31.1 pg (ref 26.0–34.0)
MCHC: 33.9 g/dL (ref 30.0–36.0)
MCV: 91.5 fL (ref 78.0–100.0)
PLATELETS: 211 10*3/uL (ref 150–400)
RBC: 5.41 MIL/uL (ref 4.22–5.81)
RDW: 12.7 % (ref 11.5–15.5)
WBC: 5.8 10*3/uL (ref 4.0–10.5)

## 2017-09-25 NOTE — Progress Notes (Addendum)
PCP: Dorothyann Peng, NP  Cardiologist: Dr. Acie Fredrickson  EKG: 01/14/17 in EP[facility]  Stress test: pt denies past 10 years  [id number] in EPIC  Cardiac Cath: pt denies  Chest x-ray: 12/25/16 in [facility]

## 2017-10-01 ENCOUNTER — Other Ambulatory Visit (INDEPENDENT_AMBULATORY_CARE_PROVIDER_SITE_OTHER): Payer: Self-pay | Admitting: Orthopaedic Surgery

## 2017-10-01 ENCOUNTER — Telehealth (INDEPENDENT_AMBULATORY_CARE_PROVIDER_SITE_OTHER): Payer: Self-pay | Admitting: Orthopaedic Surgery

## 2017-10-01 NOTE — Telephone Encounter (Signed)
See below

## 2017-10-01 NOTE — Telephone Encounter (Signed)
This will have to be added to his consent form. Please pass this along to [facility]. Thanks

## 2017-10-01 NOTE — Telephone Encounter (Signed)
Carlos Juarez

## 2017-10-01 NOTE — Telephone Encounter (Signed)
Added to procedure and consent.

## 2017-10-01 NOTE — Telephone Encounter (Signed)
Patient called asked if he can have his trigger finger (right hand middle) injected while in surgery. The number to contact patient is [phone]

## 2017-10-03 ENCOUNTER — Other Ambulatory Visit: Payer: Self-pay | Admitting: Orthopaedic Surgery

## 2017-10-03 NOTE — Care Plan (Signed)
Spoke with patient prior to surgery. He will discharge to home with the following plan. Please contact Ladell Heads, [place], with questions or if this plan should need to change.   HHPT - [facility] for 5 HHPT visits  [place] [facility] [place] -- 10/21/17 @ 1145  MD Follow up - Dr. [name] 10/21/17 @ 930   Has equipment at home.    Thanks

## 2017-10-06 ENCOUNTER — Encounter (HOSPITAL_COMMUNITY): Payer: Self-pay | Admitting: Anesthesiology

## 2017-10-06 MED ORDER — TRANEXAMIC ACID 1000 MG/10ML IV SOLN
1000.0000 mg | INTRAVENOUS | Status: AC
  Administered 2017-10-07: 1000 mg via INTRAVENOUS
  Filled 2017-10-06: qty 1100

## 2017-10-06 NOTE — Anesthesia Preprocedure Evaluation (Addendum)
Anesthesia Evaluation  Patient identified by MRN, date of birth, ID band Patient awake    Reviewed: Allergy & Precautions, NPO status , Patient's Chart, lab work & pertinent test results  History of Anesthesia Complications Negative for: history of anesthetic complications  Airway Mallampati: I       Dental no notable dental hx. (+) Teeth Intact   Pulmonary former smoker,    breath sounds clear to auscultation       Cardiovascular hypertension, Pt. on medications and Pt. on home beta blockers (-) anginaNormal cardiovascular exam Rhythm:Regular Rate:Normal     Neuro/Psych negative neurological ROS     GI/Hepatic negative GI ROS, Neg liver ROS,   Endo/Other  Hypothyroidism   Renal/GU negative Renal ROS     Musculoskeletal  (+) Arthritis , Osteoarthritis,    Abdominal Normal abdominal exam  (+)   Peds  Hematology negative hematology ROS (+)   Anesthesia Other Findings   Reproductive/Obstetrics                            Anesthesia Physical  Anesthesia Plan  ASA: II  Anesthesia Plan: Spinal   Post-op Pain Management:  Regional for Post-op pain   Induction:   PONV Risk Score and Plan: 1 and Ondansetron  Airway Management Planned: Natural Airway and Nasal Cannula  Additional Equipment:   Intra-op Plan:   Post-operative Plan:   Informed Consent:   Dental advisory given  Plan Discussed with: CRNA and Surgeon  Anesthesia Plan Comments: (Plan routine monitors, SAB with adductor canal block for post op analgesia)        Anesthesia Quick Evaluation

## 2017-10-07 ENCOUNTER — Other Ambulatory Visit: Payer: Self-pay

## 2017-10-07 ENCOUNTER — Ambulatory Visit (HOSPITAL_COMMUNITY): Payer: Medicare Other | Admitting: Certified Registered"

## 2017-10-07 ENCOUNTER — Inpatient Hospital Stay (HOSPITAL_COMMUNITY): Payer: Medicare Other

## 2017-10-07 ENCOUNTER — Encounter (HOSPITAL_COMMUNITY): Payer: Self-pay | Admitting: General Practice

## 2017-10-07 ENCOUNTER — Inpatient Hospital Stay (HOSPITAL_COMMUNITY)
Admission: RE | Admit: 2017-10-07 | Discharge: 2017-10-09 | DRG: 470 | Disposition: A | Discharge status: Home Health | Payer: Medicare Other | Attending: Orthopaedic Surgery | Admitting: Orthopaedic Surgery

## 2017-10-07 ENCOUNTER — Encounter (HOSPITAL_COMMUNITY)
Admission: RE | Disposition: A | Discharge status: Home Health | Payer: Self-pay | Source: Home / Self Care | Attending: Orthopaedic Surgery

## 2017-10-07 DIAGNOSIS — Z96652 Presence of left artificial knee joint: Secondary | ICD-10-CM | POA: Diagnosis present

## 2017-10-07 DIAGNOSIS — M65331 Trigger finger, right middle finger: Secondary | ICD-10-CM | POA: Diagnosis present

## 2017-10-07 DIAGNOSIS — Z8249 Family history of ischemic heart disease and other diseases of the circulatory system: Secondary | ICD-10-CM | POA: Diagnosis not present

## 2017-10-07 DIAGNOSIS — E7800 Pure hypercholesterolemia, unspecified: Secondary | ICD-10-CM | POA: Diagnosis present

## 2017-10-07 DIAGNOSIS — Z9089 Acquired absence of other organs: Secondary | ICD-10-CM | POA: Diagnosis not present

## 2017-10-07 DIAGNOSIS — G8918 Other acute postprocedural pain: Secondary | ICD-10-CM | POA: Diagnosis not present

## 2017-10-07 DIAGNOSIS — Z87891 Personal history of nicotine dependence: Secondary | ICD-10-CM

## 2017-10-07 DIAGNOSIS — I10 Essential (primary) hypertension: Secondary | ICD-10-CM | POA: Diagnosis present

## 2017-10-07 DIAGNOSIS — M25761 Osteophyte, right knee: Secondary | ICD-10-CM | POA: Diagnosis present

## 2017-10-07 DIAGNOSIS — E039 Hypothyroidism, unspecified: Secondary | ICD-10-CM | POA: Diagnosis present

## 2017-10-07 DIAGNOSIS — Z85828 Personal history of other malignant neoplasm of skin: Secondary | ICD-10-CM

## 2017-10-07 DIAGNOSIS — M1711 Unilateral primary osteoarthritis, right knee: Secondary | ICD-10-CM | POA: Diagnosis not present

## 2017-10-07 DIAGNOSIS — Z471 Aftercare following joint replacement surgery: Secondary | ICD-10-CM | POA: Diagnosis not present

## 2017-10-07 DIAGNOSIS — Z96651 Presence of right artificial knee joint: Secondary | ICD-10-CM | POA: Diagnosis not present

## 2017-10-07 DIAGNOSIS — Z888 Allergy status to other drugs, medicaments and biological substances status: Secondary | ICD-10-CM | POA: Diagnosis not present

## 2017-10-07 HISTORY — PX: STERIOD INJECTION: SHX5046

## 2017-10-07 HISTORY — PX: TOTAL KNEE ARTHROPLASTY: SHX125

## 2017-10-07 HISTORY — DX: Trigger finger, unspecified middle finger: M65.339

## 2017-10-07 SURGERY — ARTHROPLASTY, KNEE, TOTAL
Anesthesia: Spinal | Site: Knee | Laterality: Right

## 2017-10-07 MED ORDER — ROPIVACAINE HCL 7.5 MG/ML IJ SOLN
INTRAMUSCULAR | Status: DC | PRN
  Administered 2017-10-07 (×4): 5 mL via PERINEURAL

## 2017-10-07 MED ORDER — LIDOCAINE HCL (PF) 1 % IJ SOLN
INTRAMUSCULAR | Status: DC | PRN
  Administered 2017-10-07: 1 mL

## 2017-10-07 MED ORDER — PROPOFOL 500 MG/50ML IV EMUL
INTRAVENOUS | Status: DC | PRN
  Administered 2017-10-07: 30 ug/kg/min via INTRAVENOUS

## 2017-10-07 MED ORDER — PHENOL 1.4 % MT LIQD: 1.0000 | OROMUCOSAL | Status: DC | PRN

## 2017-10-07 MED ORDER — METHOCARBAMOL 1000 MG/10ML IJ SOLN
500.0000 mg | Freq: Four times a day (QID) | INTRAVENOUS | Status: DC | PRN
  Filled 2017-10-07: qty 5

## 2017-10-07 MED ORDER — CHLORHEXIDINE GLUCONATE 4 % EX LIQD: 60.0000 mL | Freq: Once | CUTANEOUS | Status: DC

## 2017-10-07 MED ORDER — PHENYLEPHRINE HCL 10 MG/ML IJ SOLN
INTRAMUSCULAR | Status: DC | PRN
  Administered 2017-10-07: 200 ug via INTRAVENOUS

## 2017-10-07 MED ORDER — ATENOLOL 50 MG PO TABS
50.0000 mg | ORAL_TABLET | Freq: Every day | ORAL | Status: DC
  Administered 2017-10-08 – 2017-10-09 (×2): 50 mg via ORAL
  Filled 2017-10-07 (×2): qty 1

## 2017-10-07 MED ORDER — FENTANYL CITRATE (PF) 100 MCG/2ML IJ SOLN
INTRAMUSCULAR | Status: DC | PRN
  Administered 2017-10-07: 100 ug via INTRAVENOUS

## 2017-10-07 MED ORDER — MIDAZOLAM HCL 5 MG/5ML IJ SOLN
INTRAMUSCULAR | Status: DC | PRN
  Administered 2017-10-07: 1 mg via INTRAVENOUS

## 2017-10-07 MED ORDER — METOCLOPRAMIDE HCL 5 MG PO TABS: 5.0000 mg | ORAL_TABLET | Freq: Three times a day (TID) | ORAL | Status: DC | PRN

## 2017-10-07 MED ORDER — ASPIRIN EC 325 MG PO TBEC
325.0000 mg | DELAYED_RELEASE_TABLET | Freq: Two times a day (BID) | ORAL | Status: DC
  Administered 2017-10-07 – 2017-10-09 (×4): 325 mg via ORAL
  Filled 2017-10-07 (×4): qty 1

## 2017-10-07 MED ORDER — DOCUSATE SODIUM 100 MG PO CAPS
100.0000 mg | ORAL_CAPSULE | Freq: Two times a day (BID) | ORAL | Status: DC
  Administered 2017-10-07 – 2017-10-09 (×4): 100 mg via ORAL
  Filled 2017-10-07 (×4): qty 1

## 2017-10-07 MED ORDER — ONDANSETRON HCL 4 MG/2ML IJ SOLN: 4.0000 mg | Freq: Four times a day (QID) | INTRAMUSCULAR | Status: DC | PRN

## 2017-10-07 MED ORDER — EPHEDRINE SULFATE 50 MG/ML IJ SOLN
INTRAMUSCULAR | Status: DC | PRN
  Administered 2017-10-07 (×5): 10 mg via INTRAVENOUS

## 2017-10-07 MED ORDER — PROMETHAZINE HCL 25 MG/ML IJ SOLN: 6.2500 mg | INTRAMUSCULAR | Status: DC | PRN

## 2017-10-07 MED ORDER — ALUM & MAG HYDROXIDE-SIMETH 200-200-20 MG/5ML PO SUSP: 30.0000 mL | ORAL | Status: DC | PRN

## 2017-10-07 MED ORDER — POLYETHYLENE GLYCOL 3350 17 G PO PACK: 17.0000 g | PACK | Freq: Every day | ORAL | Status: DC | PRN

## 2017-10-07 MED ORDER — MIDAZOLAM HCL 2 MG/2ML IJ SOLN
INTRAMUSCULAR | Status: AC
  Filled 2017-10-07: qty 2

## 2017-10-07 MED ORDER — METHYLPREDNISOLONE ACETATE 40 MG/ML IJ SUSP
INTRAMUSCULAR | Status: AC
  Filled 2017-10-07: qty 1

## 2017-10-07 MED ORDER — HYDROMORPHONE HCL 2 MG/ML IJ SOLN
0.5000 mg | INTRAMUSCULAR | Status: DC | PRN
  Administered 2017-10-07 – 2017-10-09 (×4): 1 mg via INTRAVENOUS
  Filled 2017-10-07 (×4): qty 1

## 2017-10-07 MED ORDER — FENTANYL CITRATE (PF) 250 MCG/5ML IJ SOLN
INTRAMUSCULAR | Status: AC
  Filled 2017-10-07: qty 5

## 2017-10-07 MED ORDER — SODIUM CHLORIDE 0.9 % IV SOLN
INTRAVENOUS | Status: DC
  Administered 2017-10-07: 17:00:00 via INTRAVENOUS

## 2017-10-07 MED ORDER — METHOCARBAMOL 500 MG PO TABS
500.0000 mg | ORAL_TABLET | Freq: Four times a day (QID) | ORAL | Status: DC | PRN
  Administered 2017-10-07 – 2017-10-08 (×4): 500 mg via ORAL
  Filled 2017-10-07 (×5): qty 1

## 2017-10-07 MED ORDER — MENTHOL 3 MG MT LOZG: 1.0000 | LOZENGE | OROMUCOSAL | Status: DC | PRN

## 2017-10-07 MED ORDER — TAMSULOSIN HCL 0.4 MG PO CAPS
0.4000 mg | ORAL_CAPSULE | Freq: Every day | ORAL | Status: DC
  Administered 2017-10-08 – 2017-10-09 (×2): 0.4 mg via ORAL
  Filled 2017-10-07 (×2): qty 1

## 2017-10-07 MED ORDER — CEFAZOLIN SODIUM-DEXTROSE 2-4 GM/100ML-% IV SOLN
2.0000 g | Freq: Four times a day (QID) | INTRAVENOUS | Status: AC
  Administered 2017-10-07 (×2): 2 g via INTRAVENOUS
  Filled 2017-10-07 (×3): qty 100

## 2017-10-07 MED ORDER — PANTOPRAZOLE SODIUM 40 MG PO TBEC
40.0000 mg | DELAYED_RELEASE_TABLET | Freq: Every day | ORAL | Status: DC
  Administered 2017-10-08 – 2017-10-09 (×2): 40 mg via ORAL
  Filled 2017-10-07 (×2): qty 1

## 2017-10-07 MED ORDER — METHYLPREDNISOLONE ACETATE 40 MG/ML IJ SUSP
INTRAMUSCULAR | Status: DC | PRN
  Administered 2017-10-07: 40 mg

## 2017-10-07 MED ORDER — LIDOCAINE HCL (PF) 1 % IJ SOLN
INTRAMUSCULAR | Status: AC
  Filled 2017-10-07: qty 5

## 2017-10-07 MED ORDER — EZETIMIBE-SIMVASTATIN 10-20 MG PO TABS
1.0000 | ORAL_TABLET | Freq: Every day | ORAL | Status: DC
  Administered 2017-10-07 – 2017-10-08 (×2): 1 via ORAL
  Filled 2017-10-07 (×2): qty 1

## 2017-10-07 MED ORDER — 0.9 % SODIUM CHLORIDE (POUR BTL) OPTIME
TOPICAL | Status: DC | PRN
  Administered 2017-10-07: 1000 mL

## 2017-10-07 MED ORDER — KETOROLAC TROMETHAMINE 30 MG/ML IJ SOLN: 30.0000 mg | Freq: Once | INTRAMUSCULAR | Status: DC | PRN

## 2017-10-07 MED ORDER — SUCCINYLCHOLINE CHLORIDE 20 MG/ML IJ SOLN
INTRAMUSCULAR | Status: AC
  Filled 2017-10-07: qty 1

## 2017-10-07 MED ORDER — LIDOCAINE 2% (20 MG/ML) 5 ML SYRINGE
INTRAMUSCULAR | Status: AC
  Filled 2017-10-07: qty 5

## 2017-10-07 MED ORDER — LEVOTHYROXINE SODIUM 75 MCG PO TABS
150.0000 ug | ORAL_TABLET | Freq: Every day | ORAL | Status: DC
Start: 2017-10-08 — End: 2017-10-09
  Administered 2017-10-08 – 2017-10-09 (×2): 150 ug via ORAL
  Filled 2017-10-07 (×3): qty 2

## 2017-10-07 MED ORDER — SODIUM CHLORIDE 0.9 % IR SOLN
Status: DC | PRN
  Administered 2017-10-07: 3000 mL

## 2017-10-07 MED ORDER — ONDANSETRON HCL 4 MG PO TABS: 4.0000 mg | ORAL_TABLET | Freq: Four times a day (QID) | ORAL | Status: DC | PRN

## 2017-10-07 MED ORDER — ACETAMINOPHEN 325 MG PO TABS
325.0000 mg | ORAL_TABLET | Freq: Four times a day (QID) | ORAL | Status: DC | PRN
  Administered 2017-10-08: 325 mg via ORAL
  Filled 2017-10-07: qty 2

## 2017-10-07 MED ORDER — HYDROMORPHONE HCL 1 MG/ML IJ SOLN: 0.2500 mg | INTRAMUSCULAR | Status: DC | PRN

## 2017-10-07 MED ORDER — DIPHENHYDRAMINE HCL 12.5 MG/5ML PO ELIX: 12.5000 mg | ORAL_SOLUTION | ORAL | Status: DC | PRN

## 2017-10-07 MED ORDER — METOCLOPRAMIDE HCL 5 MG/ML IJ SOLN: 5.0000 mg | Freq: Three times a day (TID) | INTRAMUSCULAR | Status: DC | PRN

## 2017-10-07 MED ORDER — MEPERIDINE HCL 50 MG/ML IJ SOLN: 6.2500 mg | INTRAMUSCULAR | Status: DC | PRN

## 2017-10-07 MED ORDER — BUPIVACAINE IN DEXTROSE 0.75-8.25 % IT SOLN
INTRATHECAL | Status: DC | PRN
  Administered 2017-10-07: 1.6 mL via INTRATHECAL

## 2017-10-07 MED ORDER — CEFAZOLIN SODIUM-DEXTROSE 2-4 GM/100ML-% IV SOLN
2.0000 g | INTRAVENOUS | Status: AC
  Administered 2017-10-07: 2 g via INTRAVENOUS
  Filled 2017-10-07: qty 100

## 2017-10-07 MED ORDER — OXYCODONE HCL 5 MG PO TABS
10.0000 mg | ORAL_TABLET | ORAL | Status: DC | PRN
  Administered 2017-10-07: 15 mg via ORAL
  Administered 2017-10-07: 10 mg via ORAL
  Administered 2017-10-08 (×3): 15 mg via ORAL
  Filled 2017-10-07: qty 3
  Filled 2017-10-07: qty 2
  Filled 2017-10-07 (×5): qty 3

## 2017-10-07 MED ORDER — OXYCODONE HCL 5 MG PO TABS: 5.0000 mg | ORAL_TABLET | ORAL | Status: DC | PRN

## 2017-10-07 MED ORDER — LACTATED RINGERS IV SOLN
INTRAVENOUS | Status: DC | PRN
  Administered 2017-10-07 (×3): via INTRAVENOUS

## 2017-10-07 MED ORDER — ROPIVACAINE HCL 5 MG/ML IJ SOLN
INTRAMUSCULAR | Status: DC | PRN
  Administered 2017-10-07 (×2): 5 mL via PERINEURAL

## 2017-10-07 MED ORDER — PROPOFOL 10 MG/ML IV BOLUS
INTRAVENOUS | Status: AC
Start: 2017-10-07 — End: ?
  Filled 2017-10-07: qty 20

## 2017-10-07 SURGICAL SUPPLY — 73 items
APL SKNCLS STERI-STRIP NONHPOA (GAUZE/BANDAGES/DRESSINGS) ×2
BANDAGE ACE 4X5 VEL STRL LF (GAUZE/BANDAGES/DRESSINGS) ×1 IMPLANT
BANDAGE ACE 6X5 VEL STRL LF (GAUZE/BANDAGES/DRESSINGS) ×5 IMPLANT
BANDAGE ESMARK 6X9 LF (GAUZE/BANDAGES/DRESSINGS) ×2 IMPLANT
BEARIN INSERT TIBIAL SZ5 9 (Orthopedic Implant) ×3 IMPLANT
BEARING INSERT TIBIAL SZ5 9 (Orthopedic Implant) IMPLANT
BENZOIN TINCTURE PRP APPL 2/3 (GAUZE/BANDAGES/DRESSINGS) ×1 IMPLANT
BLADE SAG 18X100X1.27 (BLADE) ×3 IMPLANT
BNDG CMPR 9X6 STRL LF SNTH (GAUZE/BANDAGES/DRESSINGS) ×2
BNDG ESMARK 6X9 LF (GAUZE/BANDAGES/DRESSINGS) ×3
BOWL SMART MIX CTS (DISPOSABLE) ×3 IMPLANT
BSPLAT TIB 5 KN TRITANIUM (Knees) ×2 IMPLANT
CLSR STERI-STRIP ANTIMIC 1/2X4 (GAUZE/BANDAGES/DRESSINGS) ×1 IMPLANT
COVER SURGICAL LIGHT HANDLE (MISCELLANEOUS) ×3 IMPLANT
CUFF TOURNIQUET SINGLE 34IN LL (TOURNIQUET CUFF) ×3 IMPLANT
CUFF TOURNIQUET SINGLE 44IN (TOURNIQUET CUFF) IMPLANT
DRAPE EXTREMITY T 121X128X90 (DRAPE) ×3 IMPLANT
DRAPE HALF SHEET 40X57 (DRAPES) ×3 IMPLANT
DRAPE U-SHAPE 47X51 STRL (DRAPES) ×3 IMPLANT
DRSG PAD ABDOMINAL 8X10 ST (GAUZE/BANDAGES/DRESSINGS) ×3 IMPLANT
DURAPREP 26ML APPLICATOR (WOUND CARE) ×3 IMPLANT
ELECT CAUTERY BLADE 6.4 (BLADE) ×3 IMPLANT
ELECT REM PT RETURN 9FT ADLT (ELECTROSURGICAL) ×3
ELECTRODE REM PT RTRN 9FT ADLT (ELECTROSURGICAL) ×2 IMPLANT
FACESHIELD WRAPAROUND (MASK) ×6 IMPLANT
FACESHIELD WRAPAROUND OR TEAM (MASK) ×4 IMPLANT
FEMORAL POSTERIOR SZ5 RT (Femur) IMPLANT
GAUZE SPONGE 4X4 12PLY STRL (GAUZE/BANDAGES/DRESSINGS) ×3 IMPLANT
GAUZE XEROFORM 1X8 LF (GAUZE/BANDAGES/DRESSINGS) ×3 IMPLANT
GLOVE BIOGEL PI IND STRL 8 (GLOVE) ×4 IMPLANT
GLOVE BIOGEL PI INDICATOR 8 (GLOVE) ×2
GLOVE ORTHO TXT STRL SZ7.5 (GLOVE) ×3 IMPLANT
GLOVE SURG ORTHO 8.0 STRL STRW (GLOVE) ×3 IMPLANT
GOWN STRL REUS W/ TWL LRG LVL3 (GOWN DISPOSABLE) IMPLANT
GOWN STRL REUS W/ TWL XL LVL3 (GOWN DISPOSABLE) ×4 IMPLANT
GOWN STRL REUS W/TWL LRG LVL3 (GOWN DISPOSABLE)
GOWN STRL REUS W/TWL XL LVL3 (GOWN DISPOSABLE) ×6
HANDPIECE INTERPULSE COAX TIP (DISPOSABLE) ×3
IMMOBILIZER KNEE 22 (SOFTGOODS) ×1 IMPLANT
IMMOBILIZER KNEE 22 UNIV (SOFTGOODS) ×3 IMPLANT
KIT BASIN OR (CUSTOM PROCEDURE TRAY) ×3 IMPLANT
KIT TURNOVER KIT B (KITS) ×3 IMPLANT
KNEE PATELLA ASYMMETRIC 10X32 (Knees) ×1 IMPLANT
KNEE TIBIAL COMPONENT SZ5 (Knees) ×1 IMPLANT
MANIFOLD NEPTUNE II (INSTRUMENTS) ×3 IMPLANT
NDL SAFETY ECLIPSE 18X1.5 (NEEDLE) IMPLANT
NEEDLE HYPO 18GX1.5 SHARP (NEEDLE)
NS IRRIG 1000ML POUR BTL (IV SOLUTION) ×3 IMPLANT
PACK TOTAL JOINT (CUSTOM PROCEDURE TRAY) ×3 IMPLANT
PAD ABD 8X10 STRL (GAUZE/BANDAGES/DRESSINGS) ×1 IMPLANT
PAD ARMBOARD 7.5X6 YLW CONV (MISCELLANEOUS) ×3 IMPLANT
PAD CAST 4YDX4 CTTN HI CHSV (CAST SUPPLIES) IMPLANT
PADDING CAST COTTON 4X4 STRL (CAST SUPPLIES) ×3
PADDING CAST COTTON 6X4 STRL (CAST SUPPLIES) ×3 IMPLANT
POSTERIOR FEMORAL SZ5 RT (Femur) ×3 IMPLANT
SET HNDPC FAN SPRY TIP SCT (DISPOSABLE) ×2 IMPLANT
SET PAD KNEE POSITIONER (MISCELLANEOUS) ×3 IMPLANT
STAPLER VISISTAT 35W (STAPLE) IMPLANT
STRIP CLOSURE SKIN 1/2X4 (GAUZE/BANDAGES/DRESSINGS) IMPLANT
SUCTION FRAZIER HANDLE 10FR (MISCELLANEOUS) ×1
SUCTION TUBE FRAZIER 10FR DISP (MISCELLANEOUS) ×2 IMPLANT
SUT MNCRL AB 4-0 PS2 18 (SUTURE) IMPLANT
SUT VIC AB 0 CT1 27 (SUTURE) ×3
SUT VIC AB 0 CT1 27XBRD ANBCTR (SUTURE) ×2 IMPLANT
SUT VIC AB 1 CT1 27 (SUTURE) ×6
SUT VIC AB 1 CT1 27XBRD ANBCTR (SUTURE) ×4 IMPLANT
SUT VIC AB 2-0 CT1 27 (SUTURE) ×6
SUT VIC AB 2-0 CT1 TAPERPNT 27 (SUTURE) ×4 IMPLANT
SYR 50ML LL SCALE MARK (SYRINGE) IMPLANT
TOWEL OR 17X24 6PK STRL BLUE (TOWEL DISPOSABLE) ×3 IMPLANT
TOWEL OR 17X26 10 PK STRL BLUE (TOWEL DISPOSABLE) ×3 IMPLANT
TRAY CATH 16FR W/PLASTIC CATH (SET/KITS/TRAYS/PACK) IMPLANT
WRAP KNEE MAXI GEL POST OP (GAUZE/BANDAGES/DRESSINGS) ×3 IMPLANT

## 2017-10-07 NOTE — Brief Op Note (Signed)
10/07/2017  8:42 AM  PATIENT:  Carlos Juarez  77 y.o. male  PRE-OPERATIVE DIAGNOSIS:  osteoarthritis right knee, trigger finger right middle finger  POST-OPERATIVE DIAGNOSIS:  osteoarthritis right knee, trigger finger right middle finger  PROCEDURE:  Procedure(s): RIGHT TOTAL KNEE ARTHROPLASTY (Right) RIGHT MIDDLE TRIGGER FINGER INJECTION (Right)  SURGEON:  Surgeon(s) and Role:    Mcarthur Rossetti, MD - Primary  PHYSICIAN ASSISTANT: Benita Stabile, PA-C  ANESTHESIA:   regional and spinal  EBL:  100 mL   COUNTS:  YES  TOURNIQUET:  * Missing tourniquet times found for documented tourniquets in log: [id number] *  DICTATION: .Other Dictation: Dictation Number [id number]  PLAN OF CARE: Admit to inpatient   PATIENT DISPOSITION:  Stable to PACU   Delay start of Pharmacological VTE agent (>24hrs) due to surgical blood loss or risk of bleeding: no

## 2017-10-07 NOTE — Anesthesia Procedure Notes (Signed)
Spinal  Patient location during procedure: OR Start time: 10/07/2017 7:18 AM End time: 10/07/2017 7:22 AM Staffing Anesthesiologist: Lyn Hollingshead, MD Performed: anesthesiologist  Preanesthetic Checklist Completed: patient identified, site marked, surgical consent, pre-op evaluation, timeout performed, IV checked, risks and benefits discussed and monitors and equipment checked Spinal Block Patient position: sitting Prep: site prepped and draped and DuraPrep Patient monitoring: continuous pulse ox and blood pressure Location: L2-3 Injection technique: single-shot Needle Needle type: Pencan  Needle gauge: 24 G Needle length: 10 cm Needle insertion depth: 5 cm Assessment Sensory level: T8

## 2017-10-07 NOTE — Transfer of Care (Signed)
Immediate Anesthesia Transfer of Care Note  Patient: Carlos Juarez  Procedure(s) Performed: RIGHT TOTAL KNEE ARTHROPLASTY (Right Knee) RIGHT MIDDLE TRIGGER FINGER INJECTION (Right Finger)  Patient Location: PACU  Anesthesia Type:MAC combined with regional for post-op pain  Level of Consciousness: awake and patient cooperative  Airway & Oxygen Therapy: Patient Spontanous Breathing  Post-op Assessment: Report given to RN and Post -op Vital signs reviewed and stable  Post vital signs: Reviewed and stable  Last Vitals:  Vitals Value Taken Time  BP 95/62 10/07/2017  9:12 AM  Temp    Pulse 78 10/07/2017  9:13 AM  Resp 7 10/07/2017  9:13 AM  SpO2 94 % 10/07/2017  9:13 AM  Vitals shown include unvalidated device data.  Last Pain:  Vitals:   10/07/17 0611  PainSc: 5       Patients Stated Pain Goal: 3 (11/94/17 4081)  Complications: No apparent anesthesia complications

## 2017-10-07 NOTE — Evaluation (Signed)
Physical Therapy Evaluation Patient Details Name: Carlos Juarez MRN: [id number] DOB: [birth year] Today's Date: 10/07/2017   History of Present Illness  Pt is a 77 y/o male s/p elective R TKA. PMH includes R trigger finger, skin cancer, HTN, PVC, and L TKA.   Clinical Impression  Pt is s/p surgery above with deficits below. Pt unsteady during gait to bathroom and required min A with use of RW. Pt also shaky, which pt reports may be because a of medications as it happened during his last TKA. Reviewed supine HEP and knee precautions. Will continue to follow acutely to maximize functional mobility independence and safety.     Follow Up Recommendations Follow surgeon's recommendation for DC plan and follow-up therapies;Supervision for mobility/OOB    Equipment Recommendations  None recommended by PT    Recommendations for Other Services OT consult     Precautions / Restrictions Precautions Precautions: Knee Precaution Booklet Issued: Yes (comment) Precaution Comments: Reviewed knee precautions and supine HEP with pt.  Restrictions Weight Bearing Restrictions: Yes RLE Weight Bearing: Weight bearing as tolerated      Mobility  Bed Mobility Overal bed mobility: Needs Assistance Bed Mobility: Supine to Sit     Supine to sit: Min assist     General bed mobility comments: Min A for RLE assist. Increased time required.   Transfers Overall transfer level: Needs assistance Equipment used: Rolling walker (2 wheeled) Transfers: Sit to/from Stand Sit to Stand: Min assist;From elevated surface         General transfer comment: Min A for lift assist and steadying to stand from elevated surface. Pt with shakiness upon standing and required standing rest for symptoms to improve. Verbal cues for safe hand placement.   Ambulation/Gait Ambulation/Gait assistance: Min assist Gait Distance (Feet): 20 Feet Assistive device: Rolling walker (2 wheeled) Gait Pattern/deviations: Step-to  pattern;Decreased step length - left;Decreased step length - right;Decreased weight shift to right;Antalgic Gait velocity: Decreased    General Gait Details: Slow, antalgic gait. Unsteady as pt very shaky, so min A for balance. Verbal cues for sequencing using RW.   Stairs            Wheelchair Mobility    Modified Rankin (Stroke Patients Only)       Balance Overall balance assessment: Needs assistance Sitting-balance support: No upper extremity supported;Feet supported Sitting balance-Leahy Scale: Good     Standing balance support: Bilateral upper extremity supported;During functional activity Standing balance-Leahy Scale: Poor Standing balance comment: Reliant on BUE support.                              Pertinent Vitals/Pain Pain Assessment: 0-10 Pain Score: 8  Pain Location: R knee  Pain Descriptors / Indicators: Aching;Operative site guarding Pain Intervention(s): Limited activity within patient's tolerance;Monitored during session;Repositioned    Home Living Family/patient expects to be discharged to:: Private residence Living Arrangements: Spouse/significant other Available Help at Discharge: Family;Available 24 hours/day Type of Home: House Home Access: Level entry     Home Layout: Multi-level Home Equipment: Walker - 2 wheels;Bedside commode      Prior Function Level of Independence: Independent               Hand Dominance        Extremity/Trunk Assessment   Upper Extremity Assessment Upper Extremity Assessment: Defer to OT evaluation    Lower Extremity Assessment Lower Extremity Assessment: RLE deficits/detail RLE Deficits / Details: Reports sensory in  tact. Deficits consistent with post op pain and weakness.     Cervical / Trunk Assessment Cervical / Trunk Assessment: Normal  Communication   Communication: No difficulties  Cognition Arousal/Alertness: Awake/alert Behavior During Therapy: WFL for tasks  assessed/performed Overall Cognitive Status: Within Functional Limits for tasks assessed                                        General Comments General comments (skin integrity, edema, etc.): Pt's wife present throughout session.     Exercises Total Joint Exercises Ankle Circles/Pumps: AROM;Both;10 reps Quad Sets: (verbally reviewed ) Heel Slides: (verbally reviewed )   Assessment/Plan    PT Assessment    PT Problem List         PT Treatment Interventions      PT Goals (Current goals can be found in the Care Plan section)  Acute Rehab PT Goals Patient Stated Goal: to go home  PT Goal Formulation: With patient Time For Goal Achievement: 10/21/17 Potential to Achieve Goals: Good    Frequency     Barriers to discharge        Co-evaluation               AM-PAC PT "6 Clicks" Daily Activity  Outcome Measure Difficulty turning over in bed (including adjusting bedclothes, sheets and blankets)?: A Little Difficulty moving from lying on back to sitting on the side of the bed? : Unable Difficulty sitting down on and standing up from a chair with arms (e.g., wheelchair, bedside commode, etc,.)?: Unable Help needed moving to and from a bed to chair (including a wheelchair)?: A Little Help needed walking in hospital room?: A Little Help needed climbing 3-5 steps with a railing? : A Lot 6 Click Score: 13    End of Session Equipment Utilized During Treatment: Gait belt Activity Tolerance: Patient tolerated treatment well Patient left: in chair;with call bell/phone within reach Nurse Communication: Mobility status;Other (comment)(pt voided ) PT Visit Diagnosis: Unsteadiness on feet (R26.81);Other abnormalities of gait and mobility (R26.89);Pain Pain - Right/Left: Right Pain - part of body: Knee    Time: 6384-6659 PT Time Calculation (min) (ACUTE ONLY): 20 min   Charges:   PT Evaluation $PT Eval Low Complexity: 1 Low     PT G Codes:         Leighton Ruff, PT, DPT  Acute Rehabilitation Services  Pager: [phone]   Rudean Hitt 10/07/2017, 7:32 PM

## 2017-10-07 NOTE — Progress Notes (Signed)
Orthopedic Tech Progress Note Patient Details:  Carlos Juarez 1941/02/02 [id number]  CPM Right Knee CPM Right Knee: On Right Knee Flexion (Degrees): 90 Right Knee Extension (Degrees): 0 Additional Comments: Trapeze bar and foot roll  Post Interventions Patient Tolerated: Well Instructions Provided: Care of device, Adjustment of device  [name] 10/07/2017, 11:05 AM

## 2017-10-07 NOTE — Anesthesia Postprocedure Evaluation (Signed)
Anesthesia Post Note  Patient: Carlos Juarez  Procedure(s) Performed: RIGHT TOTAL KNEE ARTHROPLASTY (Right Knee) RIGHT MIDDLE TRIGGER FINGER INJECTION (Right Finger)     Patient location during evaluation: PACU Anesthesia Type: Spinal Level of consciousness: awake Pain management: pain level controlled Vital Signs Assessment: post-procedure vital signs reviewed and stable Respiratory status: spontaneous breathing Cardiovascular status: stable Postop Assessment: no headache, no backache, spinal receding, patient able to bend at knees and no apparent nausea or vomiting Anesthetic complications: no    Last Vitals:  Vitals:   10/07/17 1115 10/07/17 1130  BP: 128/72 125/80  Pulse: 60 (!) 54  Resp: 12 16  Temp:  (!) 36.3 C  SpO2: 97% 93%    Last Pain:  Vitals:   10/07/17 1145  TempSrc:   PainSc: 0-No pain   Pain Goal: Patients Stated Pain Goal: 3 (10/07/17 1021)               Antha Niday JR,JOHN Mateo Flow

## 2017-10-07 NOTE — Anesthesia Procedure Notes (Signed)
Anesthesia Regional Block: Adductor canal block   Pre-Anesthetic Checklist: ,, timeout performed, Correct Patient, Correct Site, Correct Laterality, Correct Procedure, Correct Position, site marked, Risks and benefits discussed,  Surgical consent,  Pre-op evaluation,  At surgeon's request and post-op pain management  Laterality: Right  Prep: chloraprep       Needles:  Injection technique: Single-shot  Needle Type: Echogenic Stimulator Needle     Needle Length: 10cm  Needle Gauge: 21   Needle insertion depth: 2 cm   Additional Needles:   Procedures:,,,, ultrasound used (permanent image in chart),,,,  Narrative:  Start time: 10/07/2017 7:00 AM End time: 10/07/2017 7:08 AM Injection made incrementally with aspirations every 5 mL.  Performed by: Personally  Anesthesiologist: Lyn Hollingshead, MD

## 2017-10-07 NOTE — H&P (Signed)
TOTAL KNEE ADMISSION H&P  Patient is being admitted for right total knee arthroplasty.  Subjective:  Chief Complaint:right knee pain.  HPI: Carlos Juarez, 77 y.o. male, has a history of pain and functional disability in the right knee due to arthritis and has failed non-surgical conservative treatments for greater than 12 weeks to includeNSAID's and/or analgesics, corticosteriod injections, viscosupplementation injections, flexibility and strengthening excercises and activity modification.  Onset of symptoms was gradual, starting 3 years ago with gradually worsening course since that time. The patient noted prior procedures on the knee to include  arthroscopy on the right knee(s).  Patient currently rates pain in the right knee(s) at 10 out of 10 with activity. Patient has night pain, worsening of pain with activity and weight bearing, pain that interferes with activities of daily living, pain with passive range of motion, crepitus and joint swelling.  Patient has evidence of subchondral sclerosis, periarticular osteophytes and joint space narrowing by imaging studies. There is no active infection.  Patient Active Problem List   Diagnosis Date Noted  . Unilateral primary osteoarthritis, right knee 07/14/2017  . History of left knee replacement 07/14/2017  . Trigger finger, right middle finger 06/09/2017  . Carpal tunnel syndrome, left upper limb 06/09/2017  . Status post total left knee replacement 03/28/2017  . Unilateral primary osteoarthritis, left knee 02/19/2017  . Chronic pain of left knee 02/19/2017  . S/P lumbar laminectomy 12/25/2016  . 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 HTN (hypertension)   . Hypercholesteremia   . Hypothyroidism   . PVC's (premature ventricular contractions)    Past Medical History:  Diagnosis Date  . Arthritis   . 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 HTN (hypertension)   . Hypercholesteremia   . Hypothyroidism   . Pneumonia    . PVC's (premature ventricular contractions)   . Synovial cyst   . Trigger middle finger    right    Past Surgical History:  Procedure Laterality Date  . CARPAL TUNNEL RELEASE    . HERNIA REPAIR     as a child r inguinal  . JOINT REPLACEMENT     TKA Dr. [name] 03/28/17  . LUMBAR LAMINECTOMY/DECOMPRESSION MICRODISCECTOMY N/A 12/25/2016   Procedure: Laminectomy and Foraminotomy - Lumbar two-Lumbar three - Lumbar three-Lumbar four, resection of synovial cyst;  Surgeon: Eustace Moore, MD;  Location: [facility];  Service: Neurosurgery;  Laterality: N/A;  . MENISCUS REPAIR Left   . synovial cyst removed     12/25/16  . TONSILLECTOMY    . TOTAL KNEE ARTHROPLASTY Left 03/28/2017   Procedure: LEFT TOTAL KNEE ARTHROPLASTY;  Surgeon: Mcarthur Rossetti, MD;  Location: [facility];  Service: Orthopedics;  Laterality: Left;    Current Facility-Administered Medications  Medication Dose Route Frequency Provider Last Rate Last Dose  . ceFAZolin (ANCEF) IVPB 2g/100 mL premix  2 g Intravenous On Call to OR Pete Pelt, PA-C      . chlorhexidine (HIBICLENS) 4 % liquid 4 application  60 mL Topical Once Erskine Emery W, PA-C      . tranexamic acid (CYKLOKAPRON) 1,000 mg in sodium chloride 0.9 % 100 mL IVPB  1,000 mg Intravenous To OR Mcarthur Rossetti, MD       Allergies  Allergen Reactions  . Epinephrine Other (See Comments)    Light headedness    Social History   Tobacco Use  . Smoking status: Former Smoker    Packs/day: 1.00    Years:  15.00    Pack years: 15.00    Last attempt to quit: 07/15/1974    Years since quitting: 43.2  . Smokeless tobacco: Never Used  . Tobacco comment: smoked heavy 15 years and then quit  Substance Use Topics  . Alcohol use: Yes    Alcohol/week: 0.0 oz    Comment: Wine with meals at times     Family History  Problem Relation Age of Onset  . Hypertension Mother   . Heart disease Father   . Heart attack Father 74     Review of Systems   Musculoskeletal: Positive for joint pain.  All other systems reviewed and are negative.   Objective:  Physical Exam  Constitutional: He is oriented to person, place, and time. He appears well-developed and well-nourished.  HENT:  Head: Normocephalic and atraumatic.  Eyes: Pupils are equal, round, and reactive to light. EOM are normal.  Neck: Normal range of motion. Neck supple.  Cardiovascular: Normal rate and regular rhythm.  Respiratory: Effort normal and breath sounds normal.  GI: Soft. Bowel sounds are normal.  Musculoskeletal:       Right knee: He exhibits decreased range of motion, effusion and abnormal alignment. Tenderness found. Medial joint line and lateral joint line tenderness noted.  Neurological: He is alert and oriented to person, place, and time.  Skin: Skin is warm and dry.  Psychiatric: He has a normal mood and affect.    Vital signs in last 24 hours: Temp:  [97.7 F (36.5 C)] 97.7 F (36.5 C) (06/25 0542) Pulse Rate:  [60] 60 (06/25 0542) Resp:  [20] 20 (06/25 0542) BP: (127)/(78) 127/78 (06/25 0542) SpO2:  [95 %] 95 % (06/25 0542) Weight:  [207 lb 2 oz (94 kg)] 207 lb 2 oz (94 kg) (06/25 0611)  Labs:   Estimated body mass index is 28.89 kg/m as calculated from the following:   Height as of this encounter: 5\' 11"  (1.803 m).   Weight as of this encounter: 207 lb 2 oz (94 kg).   Imaging Review Plain radiographs demonstrate severe degenerative joint disease of the right knee(s). The overall alignment ismild varus. The bone quality appears to be excellent for age and reported activity level.   Preoperative templating of the joint replacement has been completed, documented, and submitted to the Operating Room personnel in order to optimize intra-operative equipment management.   Anticipated LOS equal to or greater than 2 midnights due to - Age 10 and older with one or more of the following:  - Obesity  - Expected need for hospital services (PT, OT,  Nursing) required for safe  discharge  - Anticipated need for postoperative skilled nursing care or inpatient rehab  - Active co-morbidities: None OR   - Unanticipated findings during/Post Surgery: None  - Patient is a high risk of re-admission due to: None     Assessment/Plan:  End stage arthritis, right knee   The patient history, physical examination, clinical judgment of the provider and imaging studies are consistent with end stage degenerative joint disease of the right knee(s) and total knee arthroplasty is deemed medically necessary. The treatment options including medical management, injection therapy arthroscopy and arthroplasty were discussed at length. The risks and benefits of total knee arthroplasty were presented and reviewed. The risks due to aseptic loosening, infection, stiffness, patella tracking problems, thromboembolic complications and other imponderables were discussed. The patient acknowledged the explanation, agreed to proceed with the plan and consent was signed. Patient is being admitted for inpatient  treatment for surgery, pain control, PT, OT, prophylactic antibiotics, VTE prophylaxis, progressive ambulation and ADL's and discharge planning. The patient is planning to be discharged home with home health services

## 2017-10-07 NOTE — Op Note (Signed)
NAME: MARQUIST, BINSTOCK. MEDICAL RECORD [id number] ACCOUNT [id number] DATE OF BIRTH:June 15, 1940 FACILITY: [facility] LOCATION: [facility] PHYSICIAN:Muna Demers [place], MD  OPERATIVE REPORT  DATE OF PROCEDURE:  10/07/2017  PREOPERATIVE DIAGNOSIS:  Primary osteoarthritis and  degenerative joint disease, right knee.  POSTOPERATIVE DIAGNOSIS:  Primary osteoarthritis and  degenerative joint disease, right knee.   PROCEDURE:  Right total knee arthroplasty.  IMPLANTS:  Stryker Triathlon press-fit knee system with size 5 press-fit femur, size 5 press-fit tibial tray,  9 mm fixed bearing polyethylene insert, size 32 press-fit patellar button.  SURGEON:  Lind Guest. [name], MD  ASSISTANT:  Erskine Emery, PA-C  ANESTHESIA:   1.  Right lower extremity adductor canal block. 2.  Spinal.  TOURNIQUET TIME:  Less  than 1 hour.  ANTIBIOTICS:  Two grams IV Ancef.  BLOOD LOSS:  Zero, less than 100 mL.  COMPLICATIONS:  None.  INDICATIONS:  The patient is a 77 year old active individual with known severe arthritis involving his right knee.  He actually had a left total knee arthroplasty just in December this past year and did well with this.  At this point, he does wish to  proceed with a right total knee arthroplasty.  While he is in the OR, he would like to have a steroid injection in an active trigger finger of his right middle finger and I told him we could do that of the case and this was added to the consent form as  well and witnessed by everyone in the preoperative area as well.  DESCRIPTION OF PROCEDURE:  After informed consent was obtained,  appropriate right knee was marked.  He was brought to the operating room after an adductor canal block was obtained in the holding room.  In the operating room, spinal anesthesia was  obtained.  He was then laid in the supine position.  A nonsterile tourniquet was placed around the upper right thigh and his right leg.  From the thigh down the  toes were prepped and draped with DuraPrep and sterile drapes.  A time-out was called.  He  was identified as the correct patient, correct  right knee.  We then used the Esmarch to wrap that leg and tourniquet was inflated to 300 mm of pressure.  We then made a direct midline incision over the patella and carried this proximally and distally.   We dissected down to the knee joint carried out.  A medial parapatellar arthrotomy,  finding a large joint effusion and significant arthritis throughout his knee.  With the knee in a flexed position, we removed remnants of ACL, PCL, medial and lateral  meniscus.  We removed periarticular osteophytes around the knee as well.  We then set our extramedullary cutting guide for making our tibia cut, first taking 9 mm off the high side and correction of the varus valgus and neutral slope.  We made this cut  without difficulty.  We then went to the femur and used the intramedullary guide for making our distal femoral cut.  We put this guide for the distal femur set at 5 degrees externally rotated, for a 10 mm distal femoral cut.  We made this cut without  difficulty and brought the knee back down to full extension and made sure we removed enough meniscus and debris from the back of the knee and placed a 9 mm extension block.  We had achieved full extension.  We then went back to the femur, put our femoral  sizing guide, based off the epicondylar axis  for 3 degrees.  We chose a size 5 femur based off of this.  We put a 4-in-1 cutting block for a size 5 femur, made our anterior and posterior cuts followed by our chamfer cuts.  We then made our femoral box  cut.  We went back to the tibia and chose a size 5 tibial tray for coverage and set our rotation off the femur and the tibial tubercle and made our keel cut for a size 5 tibial tray.  With a size 5 trial tibial tray and the 5 femur in place, we tried a 9  mm trial polyethylene insert.  We were pleased with the range of  motion and stability.  We then made our patellar cut and drilled 3 holes for a press-fit size 32 patella button.  We then removed all instrumentation from the knee and irrigated the knee  with normal saline solution using pulsatile lavage.  We then dried the knee really well and placed our real Stryker press-fit tibial tray,  size 5 followed by real size 5 press-fit femur for a right knee.  We placed our real 9 millimeter fixed bearing  polyethylene insert and then press-fit our size 32 patella button.  We were pleased with the stability of the knee after that and the fit of the implants.   We then let our tourniquet down and hemostasis obtained with electrocautery.   We closed the  arthrotomy with interrupted #1 Vicryl suture followed by 0 Vicryl in the deep tissue, 2-0 Vicryl subcutaneous tissue, 4-0 Monocryl subcuticular stitch and Steri-Strips on the skin.  A well-padded sterile dressing was applied.    He was taken to recovery room in stable condition after having a steroid injection placed in the A1 pulley of his right middle finger per his wishes and the consent.    Of note, Benita Stabile, PA-C, assisted the entire case.  His assistance was crucial for facilitating all aspects of this case.  AN/NUANCE  D:10/07/2017 T:10/07/2017 JOB:[id number]

## 2017-10-07 NOTE — Anesthesia Procedure Notes (Signed)
Procedure Name: MAC Date/Time: 10/07/2017 7:23 AM Performed by: Lance Coon, CRNA Pre-anesthesia Checklist: Patient identified, Emergency Drugs available, Suction available, Patient being monitored and Timeout performed Patient Re-evaluated:Patient Re-evaluated prior to induction Oxygen Delivery Method: Nasal cannula

## 2017-10-08 ENCOUNTER — Other Ambulatory Visit: Payer: Self-pay

## 2017-10-08 ENCOUNTER — Encounter (HOSPITAL_COMMUNITY): Payer: Self-pay | Admitting: Orthopaedic Surgery

## 2017-10-08 LAB — BASIC METABOLIC PANEL
Anion gap: 6 (ref 5–15)
BUN: 13 mg/dL (ref 8–23)
CHLORIDE: 102 mmol/L (ref 98–111)
CO2: 31 mmol/L (ref 22–32)
Calcium: 8.4 mg/dL — ABNORMAL LOW (ref 8.9–10.3)
Creatinine, Ser: 1.04 mg/dL (ref 0.61–1.24)
GFR calc Af Amer: >60 mL/min (ref >60)
Glucose, Bld: 143 mg/dL — ABNORMAL HIGH (ref 70–99)
Potassium: 3.9 mmol/L (ref 3.5–5.1)
SODIUM: 139 mmol/L (ref 135–145)

## 2017-10-08 LAB — CBC
HEMATOCRIT: 41.2 % (ref 39.0–52.0)
HEMOGLOBIN: 13.8 g/dL (ref 13.0–17.0)
MCH: 31.2 pg (ref 26.0–34.0)
MCHC: 33.5 g/dL (ref 30.0–36.0)
MCV: 93.2 fL (ref 78.0–100.0)
Platelets: 174 10*3/uL (ref 150–400)
RBC: 4.42 MIL/uL (ref 4.22–5.81)
RDW: 12.5 % (ref 11.5–15.5)
WBC: 9.8 10*3/uL (ref 4.0–10.5)

## 2017-10-08 NOTE — Progress Notes (Signed)
Subjective: 1 Day Post-Op Procedure(s) (LRB): RIGHT TOTAL KNEE ARTHROPLASTY (Right) RIGHT MIDDLE TRIGGER FINGER INJECTION (Right) Patient reports pain as moderate.    Objective: Vital signs in last 24 hours: Temp:  [97 F (36.1 C)-98.2 F (36.8 C)] 98.2 F (36.8 C) (06/26 0331) Pulse Rate:  [51-77] 77 (06/26 0331) Resp:  [12-28] 18 (06/26 0331) BP: (106-134)/(65-80) 134/65 (06/26 0331) SpO2:  [87 %-100 %] 100 % (06/26 0331)  Intake/Output from previous day: 06/25 0701 - 06/26 0700 In: 3110 [P.O.:360; I.V.:2750] Out: 1000 [Urine:900; Blood:100] Intake/Output this shift: No intake/output data recorded.  Recent Labs    10/08/17 0352  HGB 13.8   Recent Labs    10/08/17 0352  WBC 9.8  RBC 4.42  HCT 41.2  PLT 174   Recent Labs    10/08/17 0352  NA 139  K 3.9  CL 102  CO2 31  BUN 13  CREATININE 1.04  GLUCOSE 143*  CALCIUM 8.4*   No results for input(s): LABPT, INR in the last 72 hours.  Sensation intact distally Intact pulses distally Dorsiflexion/Plantar flexion intact Incision: dressing C/D/I Compartment soft  Anticipated LOS equal to or greater than 2 midnights due to - Age 48 and older with one or more of the following:  - Obesity  - Expected need for hospital services (PT, OT, Nursing) required for safe  discharge  - Anticipated need for postoperative skilled nursing care or inpatient rehab  - Active co-morbidities: None OR   - Unanticipated findings during/Post Surgery: None  - Patient is a high risk of re-admission due to: None   Assessment/Plan: 1 Day Post-Op Procedure(s) (LRB): RIGHT TOTAL KNEE ARTHROPLASTY (Right) RIGHT MIDDLE TRIGGER FINGER INJECTION (Right) Up with therapy    Mcarthur Rossetti 10/08/2017, 7:11 AM

## 2017-10-08 NOTE — Progress Notes (Signed)
Occupational Therapy Evaluation Patient Details Name: Carlos Juarez MRN: [id number] DOB: [birth year] Today's Date: 10/08/2017    History of Present Illness Pt is a 77 y/o male s/p elective R TKA (and R middle finger trigger finger injection). PMH includes R trigger finger, OA, carpel tunnel L UE, lumbar laminectomy, skin cancer, HTN, PVC, and L TKA.    Clinical Impression   PTA patient was independent with ADL, IADL and mobility.  He currently requires supervision for UB ADL, min assist for LB bathing, mod assist for LB dressing, min assist for toileting, and mod assist for toilet transfers.  Patient educated on precautions, positioning, ice application, compensatory techniques, safety, and DME.  Patient will be discharging home with spouses support 24/7.  He is currently limited by pain, decreased activity tolerance, balance, and safety.  Will continue to follow patient while admitted in order to maximize independence with self care and functional transfers prior to dc home. No DME or follow up OT recommended.     Follow Up Recommendations  No OT follow up;Supervision/Assistance - 24 hour    Equipment Recommendations  None recommended by OT(has all DME needed)    Recommendations for Other Services       Precautions / Restrictions Precautions Precautions: Knee Precaution Booklet Issued: No(reviewed with patient, issued in prior PT session) Precaution Comments: Reviewed knee precautions and supine HEP with pt.  Restrictions Weight Bearing Restrictions: Yes RLE Weight Bearing: Weight bearing as tolerated      Mobility Bed Mobility Overal bed mobility: Needs Assistance Bed Mobility: Supine to Sit;Sit to Supine     Supine to sit: Min assist Sit to supine: Min assist   General bed mobility comments: Min A for RLE assist. Increased time required.   Transfers Overall transfer level: Needs assistance Equipment used: Rolling walker (2 wheeled) Transfers: Sit to/from Stand Sit  to Stand: Min assist;Mod assist         General transfer comment: minA from elevated surface, modA from commode; cueing required for hand placement and safety     Balance Overall balance assessment: Needs assistance Sitting-balance support: No upper extremity supported;Feet supported Sitting balance-Leahy Scale: Good     Standing balance support: Bilateral upper extremity supported;During functional activity Standing balance-Leahy Scale: Poor Standing balance comment: Reliant on BUE support.                            ADL either performed or assessed with clinical judgement   ADL Overall ADL's : Needs assistance/impaired Eating/Feeding: Supervision/ safety;Sitting   Grooming: Min guard;Standing Grooming Details (indicate cue type and reason): steady assist to wash hands at sink, leaning on sink with elbows Upper Body Bathing: Supervision/ safety;Sitting   Lower Body Bathing: Minimal assistance;Sit to/from stand;Cueing for compensatory techniques Lower Body Bathing Details (indicate cue type and reason): reviewed Long sponge, completing seated, and having supervision Upper Body Dressing : Supervision/safety;Sitting   Lower Body Dressing: Sit to/from stand;Cueing for safety;Moderate assistance;Cueing for compensatory techniques Lower Body Dressing Details (indicate cue type and reason): decreased reach to R LE for clothing managment (spouse plants to assist) Toilet Transfer: Moderate assistance;Grab bars;Comfort height toilet;Ambulation;RW;Cueing for sequencing;Cueing for safety Toilet Transfer Details (indicate cue type and reason): cueing for hand placement, assist to power up from commode Toileting- Clothing Manipulation and Hygiene: Minimal assistance;Sit to/from stand       Functional mobility during ADLs: Minimal assistance;Rolling walker(short distance mobility to/from restroom, slow mobility) General ADL Comments: patient motivated, limited  by pain in R knee,  slow moving       Vision Baseline Vision/History: Wears glasses Wears Glasses: At all times Patient Visual Report: No change from baseline Vision Assessment?: No apparent visual deficits     Perception     Praxis      Pertinent Vitals/Pain Pain Assessment: Faces Pain Score: 6  Faces Pain Scale: Hurts even more Pain Location: R knee  Pain Descriptors / Indicators: Discomfort;Grimacing;Guarding;Operative site guarding Pain Intervention(s): Limited activity within patient's tolerance;Monitored during session;Repositioned;Ice applied     Hand Dominance     Extremity/Trunk Assessment Upper Extremity Assessment Upper Extremity Assessment: Overall WFL for tasks assessed   Lower Extremity Assessment Lower Extremity Assessment: Defer to PT evaluation RLE Deficits / Details: s/p TKA   Cervical / Trunk Assessment Cervical / Trunk Assessment: Normal   Communication Communication Communication: No difficulties   Cognition Arousal/Alertness: Awake/alert Behavior During Therapy: WFL for tasks assessed/performed Overall Cognitive Status: Within Functional Limits for tasks assessed                                     General Comments  spouse present throughout session    Exercises     Shoulder Instructions      Home Living Family/patient expects to be discharged to:: Private residence Living Arrangements: Spouse/significant other Available Help at Discharge: Family;Available 24 hours/day Type of Home: House Home Access: Level entry     Home Layout: Multi-level Alternate Level Stairs-Number of Steps: elevator   Bathroom Shower/Tub: Walk-in shower   Bathroom Toilet: Handicapped height     Home Equipment: Environmental consultant - 2 wheels;Bedside commode          Prior Functioning/Environment Level of Independence: Independent        Comments: independent with ADL, IADL and mobility         OT Problem List: Decreased activity tolerance;Impaired balance  (sitting and/or standing);Decreased strength;Decreased range of motion;Decreased safety awareness;Decreased knowledge of precautions;Pain      OT Treatment/Interventions: Self-care/ADL training;DME and/or AE instruction;Energy conservation;Patient/family education;Therapeutic activities    OT Goals(Current goals can be found in the care plan section) Acute Rehab OT Goals Patient Stated Goal: to go home  OT Goal Formulation: With patient/family Time For Goal Achievement: 10/22/17 Potential to Achieve Goals: Good  OT Frequency: Min 2X/week   Barriers to D/C:            Co-evaluation              AM-PAC PT "6 Clicks" Daily Activity     Outcome Measure Help from another person eating meals?: None Help from another person taking care of personal grooming?: A Little Help from another person toileting, which includes using toliet, bedpan, or urinal?: A Little Help from another person bathing (including washing, rinsing, drying)?: A Little Help from another person to put on and taking off regular upper body clothing?: None Help from another person to put on and taking off regular lower body clothing?: A Lot 6 Click Score: 19   End of Session Equipment Utilized During Treatment: Gait belt;Rolling walker  Activity Tolerance: Patient tolerated treatment well;Patient limited by pain Patient left: in bed;with call bell/phone within reach;with family/visitor present(ice applied)  OT Visit Diagnosis: Other abnormalities of gait and mobility (R26.89);Pain Pain - Right/Left: Right Pain - part of body: Knee                Time: 6734-1937  OT Time Calculation (min): 30 min Charges:  OT General Charges $OT Visit: 1 Visit OT Evaluation $OT Eval Moderate Complexity: 1 Mod OT Treatments $Self Care/Home Management : 8-22 mins G-Codes:     Delight Stare, OTR/L  Pager [phone]   Delight Stare 10/08/2017, 3:54 PM

## 2017-10-08 NOTE — Progress Notes (Signed)
Physical Therapy Treatment Patient Details Name: Carlos Juarez MRN: [id number] DOB: 10-12-1940 Today's Date: 10/08/2017    History of Present Illness Pt is a 77 y/o male s/p elective R TKA. PMH includes R trigger finger, skin cancer, HTN, PVC, and L TKA.     PT Comments    Pt slow and guarded due to pain but able to progress mobility.  Tx focused on gait training with improved posture and correct sequencing.  Plan for LE exercises this pm.      Follow Up Recommendations  Follow surgeon's recommendation for DC plan and follow-up therapies;Supervision for mobility/OOB     Equipment Recommendations  None recommended by PT    Recommendations for Other Services       Precautions / Restrictions Precautions Precautions: Knee Precaution Booklet Issued: Yes (comment) Precaution Comments: Reviewed knee precautions and supine HEP with pt.  Restrictions Weight Bearing Restrictions: Yes RLE Weight Bearing: Weight bearing as tolerated    Mobility  Bed Mobility Overal bed mobility: Needs Assistance Bed Mobility: Supine to Sit     Supine to sit: Min assist     General bed mobility comments: Min A for RLE assist. Increased time required.   Transfers Overall transfer level: Needs assistance Equipment used: Rolling walker (2 wheeled) Transfers: Sit to/from Stand Sit to Stand: From elevated surface;Min assist         General transfer comment: Min assist for balance and boosting into standing.  Pt required cues for hand placement to and from seated surface.    Ambulation/Gait Ambulation/Gait assistance: Min guard Gait Distance (Feet): 60 Feet Assistive device: Rolling walker (2 wheeled) Gait Pattern/deviations: Step-to pattern;Decreased step length - left;Decreased step length - right;Decreased weight shift to right;Antalgic;Trunk flexed Gait velocity: Decreased    General Gait Details: Cues for RW spacing. sequencing and frequent cueing for posture.     Stairs              Wheelchair Mobility    Modified Rankin (Stroke Patients Only)       Balance Overall balance assessment: Needs assistance Sitting-balance support: No upper extremity supported;Feet supported Sitting balance-Leahy Scale: Good       Standing balance-Leahy Scale: Poor                              Cognition Arousal/Alertness: Awake/alert Behavior During Therapy: WFL for tasks assessed/performed Overall Cognitive Status: Within Functional Limits for tasks assessed                                        Exercises      General Comments        Pertinent Vitals/Pain Pain Assessment: 0-10 Pain Score: 6  Pain Location: R knee  Pain Descriptors / Indicators: Aching;Operative site guarding Pain Intervention(s): Monitored during session;Repositioned;Premedicated before session    Home Living                      Prior Function            PT Goals (current goals can now be found in the care plan section) Acute Rehab PT Goals Patient Stated Goal: to go home  Potential to Achieve Goals: Good Progress towards PT goals: Progressing toward goals    Frequency           PT Plan Current plan  remains appropriate    Co-evaluation              AM-PAC PT "6 Clicks" Daily Activity  Outcome Measure  Difficulty turning over in bed (including adjusting bedclothes, sheets and blankets)?: A Little Difficulty moving from lying on back to sitting on the side of the bed? : Unable Difficulty sitting down on and standing up from a chair with arms (e.g., wheelchair, bedside commode, etc,.)?: Unable Help needed moving to and from a bed to chair (including a wheelchair)?: A Little Help needed walking in hospital room?: A Little Help needed climbing 3-5 steps with a railing? : A Little 6 Click Score: 14    End of Session Equipment Utilized During Treatment: Gait belt Activity Tolerance: Patient tolerated treatment well Patient  left: (sitting on commode with instructions to pull string when finished.  ) Nurse Communication: Mobility status PT Visit Diagnosis: Unsteadiness on feet (R26.81);Other abnormalities of gait and mobility (R26.89);Pain Pain - Right/Left: Right Pain - part of body: Knee     Time: 6812-7517 PT Time Calculation (min) (ACUTE ONLY): 19 min  Charges:  $Gait Training: 8-22 mins                    G Codes:       Governor Rooks, PTA pager [phone]    Cristela Blue 10/08/2017, 3:04 PM

## 2017-10-08 NOTE — Progress Notes (Signed)
Physical Therapy Treatment Patient Details Name: Carlos Juarez MRN: [id number] DOB: 25-Nov-1940 Today's Date: 10/08/2017    History of Present Illness Pt is a 77 y/o male s/p elective R TKA (and R middle finger trigger finger injection). PMH includes R trigger finger, OA, carpel tunnel L UE, lumbar laminectomy, skin cancer, HTN, PVC, and L TKA.     PT Comments    Pt remains slow and guarded due to pain but he is progressing well.  Pt will benefit from continues PT to improve strength, coordination and R knee ROM.  Plan for stair training in am and additional review of HEP for home use.  Pt requesting pain during session and RN informed.    Follow Up Recommendations  Follow surgeon's recommendation for DC plan and follow-up therapies;Supervision for mobility/OOB     Equipment Recommendations  None recommended by PT    Recommendations for Other Services       Precautions / Restrictions Precautions Precautions: Knee Precaution Booklet Issued: No(reviewed with patient, issued in prior PT session) Precaution Comments: Reviewed knee precautions and supine HEP with pt.  Restrictions Weight Bearing Restrictions: Yes RLE Weight Bearing: Weight bearing as tolerated    Mobility  Bed Mobility Overal bed mobility: Needs Assistance Bed Mobility: Supine to Sit;Sit to Supine     Supine to sit: Min assist Sit to supine: Supervision   General bed mobility comments: Min A for RLE assist. Increased time required.  Used hooking method to return to bed.    Transfers Overall transfer level: Needs assistance Equipment used: Rolling walker (2 wheeled) Transfers: Sit to/from Stand Sit to Stand: Min guard         General transfer comment: minA from elevated surface, modA from commode; cueing required for hand placement and safety   Ambulation/Gait Ambulation/Gait assistance: Min guard Gait Distance (Feet): 70 Feet Assistive device: Rolling walker (2 wheeled) Gait Pattern/deviations:  Step-to pattern;Decreased step length - left;Decreased step length - right;Decreased weight shift to right;Antalgic;Trunk flexed Gait velocity: Decreased    General Gait Details: Cues for RW spacing. sequencing and frequent cueing for posture.     Stairs             Wheelchair Mobility    Modified Rankin (Stroke Patients Only)       Balance Overall balance assessment: Needs assistance Sitting-balance support: No upper extremity supported;Feet supported Sitting balance-Leahy Scale: Good     Standing balance support: Bilateral upper extremity supported;During functional activity Standing balance-Leahy Scale: Poor Standing balance comment: Reliant on BUE support.                             Cognition Arousal/Alertness: Awake/alert Behavior During Therapy: WFL for tasks assessed/performed Overall Cognitive Status: Within Functional Limits for tasks assessed                                        Exercises Total Joint Exercises Ankle Circles/Pumps: AROM;Both;20 reps;Supine Quad Sets: AROM;10 reps;Right;Supine Heel Slides: AROM;Right;10 reps;Supine Hip ABduction/ADduction: AROM;Right;10 reps;Supine Straight Leg Raises: AROM;Right;10 reps;Supine Long Arc Quad: AROM;Right;10 reps;Seated Goniometric ROM: 65 degrees R knee flexion.      General Comments General comments (skin integrity, edema, etc.): spouse present throughout session      Pertinent Vitals/Pain Pain Assessment: 0-10 Pain Score: 6  Faces Pain Scale: Hurts even more Pain Location: R knee  Pain  Descriptors / Indicators: Discomfort;Grimacing;Guarding;Operative site guarding Pain Intervention(s): Monitored during session;Repositioned;Ice applied    Home Living Family/patient expects to be discharged to:: Private residence Living Arrangements: Spouse/significant other Available Help at Discharge: Family;Available 24 hours/day Type of Home: House Home Access: Level entry    Home Layout: Multi-level Home Equipment: Walker - 2 wheels;Bedside commode      Prior Function Level of Independence: Independent      Comments: independent with ADL, IADL and mobility    PT Goals (current goals can now be found in the care plan section) Acute Rehab PT Goals Patient Stated Goal: to go home  Potential to Achieve Goals: Good Progress towards PT goals: Progressing toward goals    Frequency           PT Plan Current plan remains appropriate    Co-evaluation              AM-PAC PT "6 Clicks" Daily Activity  Outcome Measure  Difficulty turning over in bed (including adjusting bedclothes, sheets and blankets)?: A Little Difficulty moving from lying on back to sitting on the side of the bed? : Unable Difficulty sitting down on and standing up from a chair with arms (e.g., wheelchair, bedside commode, etc,.)?: Unable Help needed moving to and from a bed to chair (including a wheelchair)?: A Little Help needed walking in hospital room?: A Little Help needed climbing 3-5 steps with a railing? : A Little 6 Click Score: 14    End of Session Equipment Utilized During Treatment: Gait belt Activity Tolerance: Patient tolerated treatment well Patient left: in bed;with call bell/phone within reach;with family/visitor present Nurse Communication: Mobility status PT Visit Diagnosis: Unsteadiness on feet (R26.81);Other abnormalities of gait and mobility (R26.89);Pain Pain - Right/Left: Right Pain - part of body: Knee     Time: 6073-7106 PT Time Calculation (min) (ACUTE ONLY): 25 min  Charges:  $Gait Training: 8-22 mins $Therapeutic Exercise: 8-22 mins                    G Codes:       Governor Rooks, PTA pager [phone]    Cristela Blue 10/08/2017, 5:34 PM

## 2017-10-09 MED ORDER — ACETAMINOPHEN 325 MG PO TABS: 325.0000 mg | ORAL_TABLET | Freq: Four times a day (QID) | ORAL | 1 refills | Status: DC | PRN

## 2017-10-09 MED ORDER — ASPIRIN 325 MG PO TBEC: 325.0000 mg | DELAYED_RELEASE_TABLET | Freq: Two times a day (BID) | ORAL | 0 refills | Status: DC

## 2017-10-09 MED ORDER — OXYCODONE HCL 5 MG PO TABS: 5.0000 mg | ORAL_TABLET | ORAL | 0 refills | Status: DC | PRN

## 2017-10-09 MED ORDER — METHOCARBAMOL 500 MG PO TABS: 500.0000 mg | ORAL_TABLET | Freq: Four times a day (QID) | ORAL | 1 refills | Status: DC | PRN

## 2017-10-09 NOTE — Progress Notes (Signed)
Occupational Therapy Treatment Patient Details Name: Carlos Juarez MRN: [id number] DOB: [birth year] Today's Date: 10/09/2017    History of present illness Pt is a 77 y/o male s/p elective R TKA (and R middle finger trigger finger injection). PMH includes R trigger finger, OA, carpel tunnel L UE, lumbar laminectomy, skin cancer, HTN, PVC, and L TKA.    OT comments  Patient participates well, eager to dc home today.  Patient demonstrates ability to complete simulated shower transfers with min assist, using RW, plans to use 3:1 as shower seat and have spouses supervision/assistance as needed.  Patient continues to require intermittent min assistance to ascend from low surfaces, decreased control is descent and cueing for hand placement.  At this time patient and spouse have no further questions or concerns.  Spouse plans to assist with LB dressing, transfers and mobility as needed; is safe for dc home.  Will continue to follow while admitted.     Follow Up Recommendations  No OT follow up;Supervision/Assistance - 24 hour    Equipment Recommendations  None recommended by OT(has all DME needed)    Recommendations for Other Services      Precautions / Restrictions Precautions Precautions: Knee Precaution Booklet Issued: No Precaution Comments: Reviewed full HEP with pt Restrictions Weight Bearing Restrictions: Yes RLE Weight Bearing: Weight bearing as tolerated       Mobility Bed Mobility Overal bed mobility: Needs Assistance Bed Mobility: Supine to Sit;Sit to Supine     Supine to sit: Supervision Sit to supine: Supervision   General bed mobility comments: seated in recliner upon entry  Transfers Overall transfer level: Needs assistance Equipment used: Rolling walker (2 wheeled) Transfers: Sit to/from Stand Sit to Stand: Min assist         General transfer comment: A to ascend with fatigue, VCs for hand placement and controlled descend to sitting    Balance Overall  balance assessment: Needs assistance Sitting-balance support: No upper extremity supported;Feet supported Sitting balance-Leahy Scale: Good     Standing balance support: No upper extremity supported;During functional activity Standing balance-Leahy Scale: Poor Standing balance comment: reliant on external support for balance                           ADL either performed or assessed with clinical judgement   ADL Overall ADL's : Needs assistance/impaired                     Lower Body Dressing: Moderate assistance;Sit to/from stand;+2 for safety/equipment Lower Body Dressing Details (indicate cue type and reason): spouse assisting patinet with shoes, able to manage clothing over hips with min guard assist Toilet Transfer: Minimal assistance;Ambulation;RW(simulated to recliner ) Toilet Transfer Details (indicate cue type and reason): cueing for hand placement, assist to power up using BUEs, poor eccentric control with intermittent cueing for hand placement      Tub/ Shower Transfer: Walk-in shower;Rolling walker;3 in 1;Minimal assistance Tub/Shower Transfer Details (indicate cue type and reason): reviewed shower setup, bathing seated with supervision; simulated walk in shower over threshold using rolling walker  Functional mobility during ADLs: Min guard;Rolling walker(in room only) General ADL Comments: spouse plans to assist patinet as needed     Vision       Perception     Praxis      Cognition Arousal/Alertness: Awake/alert Behavior During Therapy: WFL for tasks assessed/performed Overall Cognitive Status: Within Functional Limits for tasks assessed  Exercises Total Joint Exercises Ankle Circles/Pumps: AROM;Both;20 reps;Supine Quad Sets: AROM;10 reps;Right;Supine Towel Squeeze: AROM;Both;10 reps;Supine Short Arc Quad: Right;10 reps;AAROM;Supine Heel Slides: AAROM;Right;10 reps;Supine Hip  ABduction/ADduction: AROM;Right;10 reps;Supine Straight Leg Raises: AAROM;Right;10 reps;Supine(manual assistance to keep knee extended) Long Arc Quad: AROM;Right;10 reps;Seated Knee Flexion: AROM;Right;10 reps;Seated Goniometric ROM: 14-65 R knee flexion   Shoulder Instructions       General Comments spouse present and supportive    Pertinent Vitals/ Pain       Pain Assessment: Faces Pain Score: 5 (0 before exercising/ambulating) Faces Pain Scale: Hurts little more Pain Location: R knee  Pain Descriptors / Indicators: Discomfort;Grimacing;Guarding;Operative site guarding Pain Intervention(s): Limited activity within patient's tolerance;Monitored during session  Home Living                                          Prior Functioning/Environment              Frequency  Min 2X/week        Progress Toward Goals  OT Goals(current goals can now be found in the care plan section)  Progress towards OT goals: Progressing toward goals  Acute Rehab OT Goals Patient Stated Goal: to go home  OT Goal Formulation: With patient/family  Plan Discharge plan remains appropriate;Frequency remains appropriate    Co-evaluation                 AM-PAC PT "6 Clicks" Daily Activity     Outcome Measure   Help from another person eating meals?: None Help from another person taking care of personal grooming?: A Little Help from another person toileting, which includes using toliet, bedpan, or urinal?: A Little Help from another person bathing (including washing, rinsing, drying)?: A Little Help from another person to put on and taking off regular upper body clothing?: None Help from another person to put on and taking off regular lower body clothing?: A Lot 6 Click Score: 19    End of Session Equipment Utilized During Treatment: Gait belt;Rolling walker  OT Visit Diagnosis: Other abnormalities of gait and mobility (R26.89);Pain Pain - Right/Left:  Right Pain - part of body: Knee   Activity Tolerance Patient tolerated treatment well   Patient Left in chair;with family/visitor present;with call bell/phone within reach   Nurse Communication          Time: 1914-7829 OT Time Calculation (min): 10 min  Charges: OT General Charges $OT Visit: 1 Visit  Delight Stare, OTR/L  Pager [place] 10/09/2017, 1:56 PM

## 2017-10-09 NOTE — Progress Notes (Signed)
Physical Therapy Treatment Patient Details Name: Carlos Juarez MRN: [id number] DOB: 1941/01/22 Today's Date: 10/09/2017    History of Present Illness Pt is a 77 y/o male s/p elective R TKA (and R middle finger trigger finger injection). PMH includes R trigger finger, OA, carpel tunnel L UE, lumbar laminectomy, skin cancer, HTN, PVC, and L TKA.     PT Comments    Pt has ambulated for increased distance, but still demonstrates short step length and antalgic gait, requiring multiple cues for posture. He tolerated addition of new exercises without increase in pain and is progressing toward his goals.  Follow Up Recommendations  Follow surgeon's recommendation for DC plan and follow-up therapies;Supervision for mobility/OOB     Equipment Recommendations  None recommended by PT    Recommendations for Other Services OT consult     Precautions / Restrictions Precautions Precautions: Knee Precaution Booklet Issued: No Precaution Comments: Reviewed full HEP with pt Restrictions Weight Bearing Restrictions: Yes RLE Weight Bearing: Weight bearing as tolerated    Mobility  Bed Mobility Overal bed mobility: Needs Assistance Bed Mobility: Supine to Sit;Sit to Supine     Supine to sit: Supervision Sit to supine: Supervision   General bed mobility comments: seated in recliner upon entry  Transfers Overall transfer level: Needs assistance Equipment used: Rolling walker (2 wheeled) Transfers: Sit to/from Stand Sit to Stand: Min assist         General transfer comment: A to ascend with fatigue, VCs for hand placement and controlled descend to sitting  Ambulation/Gait Ambulation/Gait assistance: Min guard Gait Distance (Feet): 135 Feet Assistive device: Rolling walker (2 wheeled) Gait Pattern/deviations: Step-to pattern;Decreased step length - left;Decreased step length - right;Decreased weight shift to right;Antalgic;Trunk flexed Gait velocity: Decreased   General Gait  Details: Frequent cueing for posture   Stairs             Wheelchair Mobility    Modified Rankin (Stroke Patients Only)       Balance Overall balance assessment: Needs assistance Sitting-balance support: No upper extremity supported;Feet supported Sitting balance-Leahy Scale: Good     Standing balance support: No upper extremity supported;During functional activity Standing balance-Leahy Scale: Poor Standing balance comment: reliant on external support for balance                            Cognition Arousal/Alertness: Awake/alert Behavior During Therapy: WFL for tasks assessed/performed Overall Cognitive Status: Within Functional Limits for tasks assessed                                        Exercises Total Joint Exercises Ankle Circles/Pumps: AROM;Both;20 reps;Supine Quad Sets: AROM;10 reps;Right;Supine Towel Squeeze: AROM;Both;10 reps;Supine Short Arc Quad: Right;10 reps;AAROM;Supine Heel Slides: AAROM;Right;10 reps;Supine Hip ABduction/ADduction: AROM;Right;10 reps;Supine Straight Leg Raises: AAROM;Right;10 reps;Supine(manual assistance to keep knee extended) Long Arc Quad: AROM;Right;10 reps;Seated Knee Flexion: AROM;Right;10 reps;Seated Goniometric ROM: 14-65 R knee flexion    General Comments General comments (skin integrity, edema, etc.): spouse present and supportive      Pertinent Vitals/Pain Pain Assessment: Faces Pain Score: 5 (0 before exercising/ambulating) Faces Pain Scale: Hurts little more Pain Location: R knee  Pain Descriptors / Indicators: Discomfort;Grimacing;Guarding;Operative site guarding Pain Intervention(s): Limited activity within patient's tolerance;Monitored during session    Home Living  Prior Function            PT Goals (current goals can now be found in the care plan section) Acute Rehab PT Goals Patient Stated Goal: to go home  PT Goal Formulation: With  patient Potential to Achieve Goals: Good Progress towards PT goals: Progressing toward goals    Frequency           PT Plan Current plan remains appropriate    Co-evaluation              AM-PAC PT "6 Clicks" Daily Activity  Outcome Measure  Difficulty turning over in bed (including adjusting bedclothes, sheets and blankets)?: A Little Difficulty moving from lying on back to sitting on the side of the bed? : A Little Difficulty sitting down on and standing up from a chair with arms (e.g., wheelchair, bedside commode, etc,.)?: A Little Help needed moving to and from a bed to chair (including a wheelchair)?: A Little Help needed walking in hospital room?: A Little Help needed climbing 3-5 steps with a railing? : A Lot 6 Click Score: 17    End of Session Equipment Utilized During Treatment: Gait belt Activity Tolerance: Patient tolerated treatment well Patient left: with call bell/phone within reach;with family/visitor present;in bed   PT Visit Diagnosis: Unsteadiness on feet (R26.81);Other abnormalities of gait and mobility (R26.89);Pain Pain - Right/Left: Right Pain - part of body: Knee     Time: 1131-1230 PT Time Calculation (min) (ACUTE ONLY): 59 min  Charges:  $Gait Training: 23-37 mins $Therapeutic Exercise: 8-22 mins $Therapeutic Activity: 8-22 mins                    G Codes:       Scarlett Presto, SPTA   Abdulahad Mederos Sheila Oats 10/09/2017, 4:28 PM

## 2017-10-09 NOTE — Progress Notes (Addendum)
Received call from [place] with [facility]; patient has been referred for HHPT at discharge and she stated that he has all of his equipment; CM will continue to follow for progression of care. [phone]

## 2017-10-09 NOTE — Discharge Summary (Signed)
Patient ID: BERNARDO BRAYMAN MRN: [id number] DOB/AGE: 1940/04/24 77 y.o.  Admit date: 10/07/2017 Discharge date: 10/09/2017  Admission Diagnoses:  Principal Problem:   Unilateral primary osteoarthritis, right knee Active Problems:   Status post total knee replacement, right   Discharge Diagnoses:  Same  Past Medical History:  Diagnosis Date  . Arthritis   . Cancer ([place])    skin cancer removed rom nose non melanoma  . HTN (hypertension)   . Hypercholesteremia   . Hypothyroidism   . Pneumonia   . PVC's (premature ventricular contractions)   . Synovial cyst   . Trigger middle finger    right    Surgeries: Procedure(s): RIGHT TOTAL KNEE ARTHROPLASTY RIGHT MIDDLE TRIGGER FINGER INJECTION on 10/07/2017   Consultants:   Discharged Condition: Improved  Hospital Course: WADELL CRADDOCK is an 77 y.o. male who was admitted 10/07/2017 for operative treatment ofUnilateral primary osteoarthritis, right knee. Patient has severe unremitting pain that affects sleep, daily activities, and work/hobbies. After pre-op clearance the patient was taken to the operating room on 10/07/2017 and underwent  Procedure(s): RIGHT TOTAL KNEE ARTHROPLASTY RIGHT MIDDLE TRIGGER FINGER INJECTION.    Patient was given perioperative antibiotics:  Anti-infectives (From admission, onward)   Start     Dose/Rate Route Frequency Ordered Stop   10/07/17 1330  ceFAZolin (ANCEF) IVPB 2g/100 mL premix     2 g 200 mL/hr over 30 Minutes Intravenous Every 6 hours 10/07/17 1138 10/08/17 0608   10/07/17 0600  ceFAZolin (ANCEF) IVPB 2g/100 mL premix     2 g 200 mL/hr over 30 Minutes Intravenous On call to O.R. 10/07/17 6195 10/07/17 0932       Patient was given sequential compression devices, early ambulation, and chemoprophylaxis to prevent DVT.  Patient benefited maximally from hospital stay and there were no complications.    Recent vital signs:  Patient Vitals for the past 24 hrs:  BP Temp Temp src Pulse  Resp SpO2  10/08/17 2033 (!) 149/72 98.2 F (36.8 C) Oral 76 17 98 %  10/08/17 1503 120/81 98.1 F (36.7 C) Oral 86 16 94 %     Recent laboratory studies:  Recent Labs    10/08/17 0352  WBC 9.8  HGB 13.8  HCT 41.2  PLT 174  NA 139  K 3.9  CL 102  CO2 31  BUN 13  CREATININE 1.04  GLUCOSE 143*  CALCIUM 8.4*     Discharge Medications:   Allergies as of 10/09/2017      Reactions   Epinephrine Other (See Comments)   Light headedness      Medication List    STOP taking these medications   FISH OIL + D3 PO   HYDROcodone-acetaminophen 5-325 MG tablet Commonly known as:  NORCO/VICODIN   naproxen 500 MG tablet Commonly known as:  NAPROSYN   oxyCODONE-acetaminophen 5-325 MG tablet Commonly known as:  ROXICET     TAKE these medications   acetaminophen 325 MG tablet Commonly known as:  TYLENOL Take 1-2 tablets (325-650 mg total) by mouth every 6 (six) hours as needed for mild pain (pain score 1-3 or temp > 100.5).   aspirin 325 MG EC tablet Take 1 tablet (325 mg total) by mouth 2 (two) times daily after a meal.   atenolol 50 MG tablet Commonly known as:  TENORMIN Take 50 mg by mouth daily.   ezetimibe-simvastatin 10-20 MG tablet Commonly known as:  VYTORIN Take 1 tablet daily by mouth. What changed:  when to take this  levothyroxine 150 MCG tablet Commonly known as:  SYNTHROID, LEVOTHROID Take 150 mcg by mouth daily before breakfast.   methocarbamol 500 MG tablet Commonly known as:  ROBAXIN Take 1 tablet (500 mg total) by mouth every 6 (six) hours as needed for muscle spasms.   oxyCODONE 5 MG immediate release tablet Commonly known as:  Oxy IR/ROXICODONE Take 1-2 tablets (5-10 mg total) by mouth every 4 (four) hours as needed for moderate pain (pain score 4-6).   oxymetazoline 0.05 % nasal spray Commonly known as:  AFRIN Place 1 spray into both nostrils 2 (two) times daily as needed for congestion.            Durable Medical Equipment  (From  admission, onward)        Start     Ordered   10/07/17 1139  DME 3 n 1  Once     10/07/17 1138   10/07/17 1139  DME Walker rolling  Once    Question:  Patient needs a walker to treat with the following condition  Answer:  Status post total knee replacement, right   10/07/17 1138      Diagnostic Studies: Dg Knee Right Port  Result Date: 10/07/2017 CLINICAL DATA:  Status post right knee replacement EXAM: PORTABLE RIGHT KNEE - 1-2 VIEW COMPARISON:  None. FINDINGS: Right knee prosthesis is noted in satisfactory position. No acute bony or soft tissue abnormality is noted. IMPRESSION: Status post right knee replacement Electronically Signed   By: Inez Catalina M.D.   On: 10/07/2017 10:53    Disposition:     Follow-up Information    [facility], [facility] up.   Specialty:  [place] Why:  They will do your home health care at your home Contact information: [place] [address] [phone]        Mcarthur Rossetti, MD. Schedule an appointment as soon as possible for a visit in 2 day(s).   Specialty:  Orthopedic Surgery Contact information: [place] [address] [phone]            Signed: Erskine Emery 10/09/2017, 12:18 PM

## 2017-10-09 NOTE — Progress Notes (Signed)
Subjective: 2 Days Post-Op Procedure(s) (LRB): RIGHT TOTAL KNEE ARTHROPLASTY (Right) RIGHT MIDDLE TRIGGER FINGER INJECTION (Right) Patient reports pain as moderate.  Walking in hallway with PT.   Objective: Vital signs in last 24 hours: Temp:  [98.1 F (36.7 C)-98.2 F (36.8 C)] 98.2 F (36.8 C) (06/26 2033) Pulse Rate:  [76-86] 76 (06/26 2033) Resp:  [16-17] 17 (06/26 2033) BP: (120-149)/(72-81) 149/72 (06/26 2033) SpO2:  [94 %-98 %] 98 % (06/26 2033)  Intake/Output from previous day: 06/26 0701 - 06/27 0700 In: 960 [P.O.:960] Out: -  Intake/Output this shift: No intake/output data recorded.  Recent Labs    10/08/17 0352  HGB 13.8   Recent Labs    10/08/17 0352  WBC 9.8  RBC 4.42  HCT 41.2  PLT 174   Recent Labs    10/08/17 0352  NA 139  K 3.9  CL 102  CO2 31  BUN 13  CREATININE 1.04  GLUCOSE 143*  CALCIUM 8.4*   No results for input(s): LABPT, INR in the last 72 hours.  Right lower extremity: Neurovascular intact Intact pulses distally Incision: dressing C/D/I Compartment soft    Assessment/Plan: 2 Days Post-Op Procedure(s) (LRB): RIGHT TOTAL KNEE ARTHROPLASTY (Right) RIGHT MIDDLE TRIGGER FINGER INJECTION (Right) Discharge home with home health today.     GILBERT CLARK 10/09/2017, 12:19 PM

## 2017-10-09 NOTE — Discharge Instructions (Signed)

## 2017-10-09 NOTE — Progress Notes (Signed)
Subjective: 2 Days Post-Op Procedure(s) (LRB): RIGHT TOTAL KNEE ARTHROPLASTY (Right) RIGHT MIDDLE TRIGGER FINGER INJECTION (Right) Patient reports pain as moderate.    Objective: Vital signs in last 24 hours: Temp:  [98.1 F (36.7 C)-98.2 F (36.8 C)] 98.2 F (36.8 C) (06/26 2033) Pulse Rate:  [76-86] 76 (06/26 2033) Resp:  [16-17] 17 (06/26 2033) BP: (120-149)/(65-81) 149/72 (06/26 2033) SpO2:  [94 %-98 %] 98 % (06/26 2033)  Intake/Output from previous day: 06/26 0701 - 06/27 0700 In: 960 [P.O.:960] Out: -  Intake/Output this shift: No intake/output data recorded.  Recent Labs    10/08/17 0352  HGB 13.8   Recent Labs    10/08/17 0352  WBC 9.8  RBC 4.42  HCT 41.2  PLT 174   Recent Labs    10/08/17 0352  NA 139  K 3.9  CL 102  CO2 31  BUN 13  CREATININE 1.04  GLUCOSE 143*  CALCIUM 8.4*   No results for input(s): LABPT, INR in the last 72 hours.  Sensation intact distally Intact pulses distally Dorsiflexion/Plantar flexion intact Incision: dressing C/D/I No cellulitis present Compartment soft  Assessment/Plan: 2 Days Post-Op Procedure(s) (LRB): RIGHT TOTAL KNEE ARTHROPLASTY (Right) RIGHT MIDDLE TRIGGER FINGER INJECTION (Right) Up with therapy    Mcarthur Rossetti 10/09/2017, 7:17 AM

## 2017-10-10 DIAGNOSIS — G5602 Carpal tunnel syndrome, left upper limb: Secondary | ICD-10-CM | POA: Diagnosis not present

## 2017-10-10 DIAGNOSIS — I493 Ventricular premature depolarization: Secondary | ICD-10-CM | POA: Diagnosis not present

## 2017-10-10 DIAGNOSIS — E039 Hypothyroidism, unspecified: Secondary | ICD-10-CM | POA: Diagnosis not present

## 2017-10-10 DIAGNOSIS — I10 Essential (primary) hypertension: Secondary | ICD-10-CM | POA: Diagnosis not present

## 2017-10-10 DIAGNOSIS — M47812 Spondylosis without myelopathy or radiculopathy, cervical region: Secondary | ICD-10-CM | POA: Diagnosis not present

## 2017-10-10 DIAGNOSIS — Z471 Aftercare following joint replacement surgery: Secondary | ICD-10-CM | POA: Diagnosis not present

## 2017-10-13 DIAGNOSIS — Z471 Aftercare following joint replacement surgery: Secondary | ICD-10-CM | POA: Diagnosis not present

## 2017-10-13 DIAGNOSIS — I493 Ventricular premature depolarization: Secondary | ICD-10-CM | POA: Diagnosis not present

## 2017-10-13 DIAGNOSIS — G5602 Carpal tunnel syndrome, left upper limb: Secondary | ICD-10-CM | POA: Diagnosis not present

## 2017-10-13 DIAGNOSIS — M47812 Spondylosis without myelopathy or radiculopathy, cervical region: Secondary | ICD-10-CM | POA: Diagnosis not present

## 2017-10-13 DIAGNOSIS — E039 Hypothyroidism, unspecified: Secondary | ICD-10-CM | POA: Diagnosis not present

## 2017-10-13 DIAGNOSIS — I10 Essential (primary) hypertension: Secondary | ICD-10-CM | POA: Diagnosis not present

## 2017-10-15 DIAGNOSIS — M47812 Spondylosis without myelopathy or radiculopathy, cervical region: Secondary | ICD-10-CM | POA: Diagnosis not present

## 2017-10-15 DIAGNOSIS — Z471 Aftercare following joint replacement surgery: Secondary | ICD-10-CM | POA: Diagnosis not present

## 2017-10-15 DIAGNOSIS — E039 Hypothyroidism, unspecified: Secondary | ICD-10-CM | POA: Diagnosis not present

## 2017-10-15 DIAGNOSIS — I10 Essential (primary) hypertension: Secondary | ICD-10-CM | POA: Diagnosis not present

## 2017-10-15 DIAGNOSIS — G5602 Carpal tunnel syndrome, left upper limb: Secondary | ICD-10-CM | POA: Diagnosis not present

## 2017-10-15 DIAGNOSIS — I493 Ventricular premature depolarization: Secondary | ICD-10-CM | POA: Diagnosis not present

## 2017-10-17 DIAGNOSIS — Z471 Aftercare following joint replacement surgery: Secondary | ICD-10-CM | POA: Diagnosis not present

## 2017-10-17 DIAGNOSIS — E039 Hypothyroidism, unspecified: Secondary | ICD-10-CM | POA: Diagnosis not present

## 2017-10-17 DIAGNOSIS — G5602 Carpal tunnel syndrome, left upper limb: Secondary | ICD-10-CM | POA: Diagnosis not present

## 2017-10-17 DIAGNOSIS — I10 Essential (primary) hypertension: Secondary | ICD-10-CM | POA: Diagnosis not present

## 2017-10-17 DIAGNOSIS — I493 Ventricular premature depolarization: Secondary | ICD-10-CM | POA: Diagnosis not present

## 2017-10-17 DIAGNOSIS — M47812 Spondylosis without myelopathy or radiculopathy, cervical region: Secondary | ICD-10-CM | POA: Diagnosis not present

## 2017-10-20 DIAGNOSIS — Z471 Aftercare following joint replacement surgery: Secondary | ICD-10-CM | POA: Diagnosis not present

## 2017-10-20 DIAGNOSIS — G5602 Carpal tunnel syndrome, left upper limb: Secondary | ICD-10-CM | POA: Diagnosis not present

## 2017-10-20 DIAGNOSIS — I10 Essential (primary) hypertension: Secondary | ICD-10-CM | POA: Diagnosis not present

## 2017-10-20 DIAGNOSIS — M47812 Spondylosis without myelopathy or radiculopathy, cervical region: Secondary | ICD-10-CM | POA: Diagnosis not present

## 2017-10-20 DIAGNOSIS — E039 Hypothyroidism, unspecified: Secondary | ICD-10-CM | POA: Diagnosis not present

## 2017-10-20 DIAGNOSIS — I493 Ventricular premature depolarization: Secondary | ICD-10-CM | POA: Diagnosis not present

## 2017-10-21 ENCOUNTER — Encounter: Payer: Self-pay | Admitting: Physical Therapy

## 2017-10-21 ENCOUNTER — Ambulatory Visit (INDEPENDENT_AMBULATORY_CARE_PROVIDER_SITE_OTHER): Payer: Medicare Other | Admitting: Orthopaedic Surgery

## 2017-10-21 ENCOUNTER — Other Ambulatory Visit: Payer: Self-pay

## 2017-10-21 ENCOUNTER — Other Ambulatory Visit (INDEPENDENT_AMBULATORY_CARE_PROVIDER_SITE_OTHER): Payer: Self-pay

## 2017-10-21 ENCOUNTER — Encounter (INDEPENDENT_AMBULATORY_CARE_PROVIDER_SITE_OTHER): Payer: Self-pay | Admitting: Orthopaedic Surgery

## 2017-10-21 ENCOUNTER — Ambulatory Visit: Payer: Medicare Other | Attending: Orthopaedic Surgery | Admitting: Physical Therapy

## 2017-10-21 DIAGNOSIS — M25661 Stiffness of right knee, not elsewhere classified: Secondary | ICD-10-CM

## 2017-10-21 DIAGNOSIS — R600 Localized edema: Secondary | ICD-10-CM

## 2017-10-21 DIAGNOSIS — M6281 Muscle weakness (generalized): Secondary | ICD-10-CM | POA: Diagnosis not present

## 2017-10-21 DIAGNOSIS — M25561 Pain in right knee: Secondary | ICD-10-CM

## 2017-10-21 DIAGNOSIS — Z96651 Presence of right artificial knee joint: Secondary | ICD-10-CM

## 2017-10-21 DIAGNOSIS — R262 Difficulty in walking, not elsewhere classified: Secondary | ICD-10-CM | POA: Diagnosis not present

## 2017-10-21 DIAGNOSIS — Z96652 Presence of left artificial knee joint: Secondary | ICD-10-CM

## 2017-10-21 MED ORDER — OXYCODONE HCL 5 MG PO TABS: 5.0000 mg | ORAL_TABLET | ORAL | 0 refills | Status: DC | PRN

## 2017-10-21 NOTE — Progress Notes (Signed)
The patient is 2 weeks today status post a right total knee arthroplasty.  He is actually 7 months status post a left total knee arthroplasty.  He is doing well overall.  On examination of his  right knee is extension is almost full and his flexion is close to 90 degrees.  His calf is soft and there is no swelling much in his foot.  The knee feels ligamentously stable.  The incision looks good.  We remove the old Steri-Strips and has new Steri-Strips placed.  He is in a transition outpatient therapy at this point.  He is going to resume his naproxen and stop aspirin.  We will refill his oxycodone as well.  All questions and concerns were answered and addressed.  We will see him back in a month see how is doing overall.

## 2017-10-21 NOTE — Therapy (Signed)
[place], [place], [address] Phone: [phone]   Fax:  [phone]  Physical Therapy Evaluation  Patient Details  Name: Carlos Juarez MRN: [id number] Date of Birth: [birth year] Referring Provider: Mcarthur Rossetti    Encounter Date: 10/21/2017  PT End of Session - 10/21/17 1233    Visit Number  1    Number of Visits  20    Date for PT Re-Evaluation  11/18/17    Authorization Type  Medicare/TRICARE (PN visit 97, KX visit 74)    Authorization Time Period  10/21/17 to 12/22/17    Authorization - Visit Number  1    Authorization - Number of Visits  10    PT Start Time  1147    PT Stop Time  1225    PT Time Calculation (min)  38 min    Equipment Utilized During Treatment  Gait belt    Activity Tolerance  Patient tolerated treatment well    Behavior During Therapy  WFL for tasks assessed/performed       Past Medical History:  Diagnosis Date  . Arthritis   . Cancer ([place])    skin cancer removed rom nose non melanoma  . HTN (hypertension)   . Hypercholesteremia   . Hypothyroidism   . Pneumonia   . PVC's (premature ventricular contractions)   . Synovial cyst   . Trigger middle finger    right    Past Surgical History:  Procedure Laterality Date  . CARPAL TUNNEL RELEASE    . HERNIA REPAIR     as a child r inguinal  . JOINT REPLACEMENT     [facility] Dr. [name] 03/28/17  . LUMBAR LAMINECTOMY/DECOMPRESSION MICRODISCECTOMY N/A 12/25/2016   Procedure: Laminectomy and Foraminotomy - Lumbar two-Lumbar three - Lumbar three-Lumbar four, resection of synovial cyst;  Surgeon: Eustace Moore, MD;  Location: [facility];  Service: Neurosurgery;  Laterality: N/A;  . MENISCUS REPAIR Left   . STERIOD INJECTION Right 10/07/2017   Procedure: RIGHT MIDDLE TRIGGER FINGER INJECTION;  Surgeon: Mcarthur Rossetti, MD;  Location: [facility];  Service: Orthopedics;  Laterality: Right;  . synovial cyst removed     12/25/16  .  TONSILLECTOMY    . TOTAL KNEE ARTHROPLASTY Left 03/28/2017   Procedure: LEFT TOTAL KNEE ARTHROPLASTY;  Surgeon: Mcarthur Rossetti, MD;  Location: [facility];  Service: Orthopedics;  Laterality: Left;  . TOTAL KNEE ARTHROPLASTY Right 10/07/2017   Procedure: RIGHT TOTAL KNEE ARTHROPLASTY;  Surgeon: Mcarthur Rossetti, MD;  Location: [facility];  Service: Orthopedics;  Laterality: Right;    There were no vitals filed for this visit.   Subjective Assessment - 10/21/17 1150    Subjective  I had my right knee replaced 2 weeks ago today on June 25th by Dr. [name]; I had my other knee done in December. I had home HHPT and have been discharged. My knee is stiff but it is getting better. My balance has been good. I usually work out with a Physiological scientist but am taking time off due to surgery.     How long can you sit comfortably?  10 minutes     How long can you stand comfortably?  5 minutes     How long can you walk comfortably?  10 minutes     Patient Stated Goals  get back to working with Physiological scientist, get back to bowling, ride stationary bike/ROM improved     Currently in Pain?  Yes  Pain Score  6     Pain Location  Knee    Pain Orientation  Right    Pain Descriptors / Indicators  Aching    Pain Type  Surgical pain    Pain Radiating Towards  none     Pain Onset  1 to 4 weeks ago    Pain Frequency  Constant    Aggravating Factors   doing any one thing too long    Pain Relieving Factors  pain medicine    Effect of Pain on Daily Activities  moderate          [facility] PT Assessment - 10/21/17 0001      Assessment   Medical Diagnosis  R total knee     Referring Provider  Mcarthur Rossetti     Onset Date/Surgical Date  10/07/17    Next MD Visit  Dr. [name] in one month     Prior Therapy  HHPT, was discharged       Precautions   Precautions  None      Restrictions   Weight Bearing Restrictions  No      Balance Screen   Has the patient fallen in the past 6 months  No     Has the patient had a decrease in activity level because of a fear of falling?   No    Is the patient reluctant to leave their home because of a fear of falling?   No      Prior Function   Level of Independence  Independent;Independent with basic ADLs;Independent with gait;Independent with transfers    Vocation  Retired    Leisure  bowling, spending time with friends       ROM / Strength   AROM / PROM / Strength  AROM;Strength      AROM   AROM Assessment Site  Knee    Right/Left Knee  Right    Right Knee Extension  13    Right Knee Flexion  75 AAROM, seated       Strength   Strength Assessment Site  Hip;Knee;Ankle    Right/Left Hip  Left;Right    Right Hip Flexion  3/5    Right Hip Extension  3-/5    Right Hip ABduction  3+/5    Left Hip Flexion  4/5    Left Hip Extension  3-/5    Left Hip ABduction  4+/5    Right/Left Knee  Left;Right    Right Knee Flexion  4/5    Right Knee Extension  4+/5    Left Knee Flexion  4/5    Left Knee Extension  4+/5    Right/Left Ankle  Left;Right    Right Ankle Dorsiflexion  4/5    Left Ankle Dorsiflexion  5/5      Ambulation/Gait   Gait Comments  reduced knee flexion, foot flat during gait, reduced knee extension; stairs with railing and step to pattern good sequencing                 Objective measurements completed on examination: See above findings.              PT Education - 10/21/17 1232    Education Details  exam findings, prognosis, POC,  continue with HHPT HEP as it remains appropriate     Person(s) Educated  Patient    Methods  Explanation    Comprehension  Verbalized understanding       PT Short Term Goals -  10/21/17 1235      PT SHORT TERM GOAL #1   Title  Patient to be compliant with appropriate HEP, to be updated PRN     Time  4    Period  Weeks    Status  New    Target Date  11/18/17      PT SHORT TERM GOAL #2   Title  Patient to improve L knee AROM to 0-115 degrees in order to improve  gross knee ROM and general biomechanics     Time  4    Period  Weeks    Status  New      PT SHORT TERM GOAL #3   Title  Patient to demonstrate ability to ambulate without assistive device and consistent heel-toe pattern, equal step lengths/stance times, in order to improve general mobility     Time  4    Period  Weeks    Status  New      PT SHORT TERM GOAL #4   Title  Patient to report R knee pain as being no more than 2/10 in order to show improvement of general condition     Time  4    Period  Weeks    Status  New        PT Long Term Goals - 10/21/17 1241      PT LONG TERM GOAL #1   Title  Patient to demonstrate MMT as being 5/5 in all tested muscle groups in order to improve gross knee stability and show improved strength     Time  8    Period  Weeks    Status  New    Target Date  12/16/17      PT LONG TERM GOAL #2   Title  Patient to be able to reciprocally ascend/descend full flight of stairs with U railing, good eccentric control, and minimal unsteadiness in order to improve access to home and community     Time  8    Period  Weeks    Status  New      PT LONG TERM GOAL #3   Title  Patient to be able to complete mock-bowling task with Mod(I) in order to demonstrate strength and balance needed to safely complete this activity independently     Time  8    Period  Weeks    Status  New      PT LONG TERM GOAL #4   Title  Patient to return to regular exercise programming with personal trainer and no increase in pain in order to asisst in return to PLOF based activities     Time  8    Period  Weeks    Status  New             Plan - 10/21/17 1233    Clinical Impression Statement  Patient arrives 2 weeks following R TKR by Dr. [name]; he reports he had HHPT upon return home and has since been discharged. Examination is typical for recent TKR, including localized edema, well healing incision without evidence of complication, limited knee ROM, reduced functional  strength, and impairments in gait and stair navigation. He will benefit from skilled PT services to address identified deficits, reduce pain, and assist in return to optimal level of function.     History and Personal Factors relevant to plan of care:  L TKR 2018, hx lumbar surgery     Clinical Presentation  Stable    Clinical Presentation  due to:  post-op state     Clinical Decision Making  Low    Rehab Potential  Excellent    PT Frequency  Other (comment) 3x/week for 4 weeks, then 2x/week for next 4 weeks     PT Duration  8 weeks    PT Treatment/Interventions  ADLs/Self Care Home Management;Biofeedback;Cryotherapy;Electrical Stimulation;Iontophoresis 4mg /ml Dexamethasone;Moist Heat;Ultrasound;DME Instruction;Stair training;Functional mobility training;Gait training;Therapeutic activities;Therapeutic exercise;Balance training;Neuromuscular re-education;Patient/family education;Manual techniques;Scar mobilization;Passive range of motion;Dry needling;Taping    PT Next Visit Plan  review goals from initial eval; initial focus on ROM/edema and pain control, progress to advanced functional strength. Gait and stair training.     PT Home Exercise Plan  continue with HHPT HEP as it remains appropriate     Consulted and Agree with Plan of Care  Patient       Patient will benefit from skilled therapeutic intervention in order to improve the following deficits and impairments:  Abnormal gait, Increased fascial restricitons, Improper body mechanics, Pain, Decreased coordination, Decreased mobility, Decreased scar mobility, Increased muscle spasms, Postural dysfunction, Decreased activity tolerance, Decreased range of motion, Decreased strength, Hypomobility, Decreased balance, Difficulty walking, Impaired flexibility  Visit Diagnosis: Acute pain of right knee - Plan: PT plan of care cert/re-cert  Stiffness of right knee, not elsewhere classified - Plan: PT plan of care cert/re-cert  Localized edema -  Plan: PT plan of care cert/re-cert  Muscle weakness (generalized) - Plan: PT plan of care cert/re-cert  Difficulty in walking, not elsewhere classified - Plan: PT plan of care cert/re-cert     Problem List Patient Active Problem List   Diagnosis Date Noted  . Status post total knee replacement, right 10/07/2017  . Unilateral primary osteoarthritis, right knee 07/14/2017  . History of left knee replacement 07/14/2017  . Trigger finger, right middle finger 06/09/2017  . Carpal tunnel syndrome, left upper limb 06/09/2017  . Status post total left knee replacement 03/28/2017  . Unilateral primary osteoarthritis, left knee 02/19/2017  . Chronic pain of left knee 02/19/2017  . S/P lumbar laminectomy 12/25/2016  . 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 HTN (hypertension)   . Hypercholesteremia   . Hypothyroidism   . PVC's (premature ventricular contractions)     Deniece Ree PT, DPT, CBIS  Supplemental Physical Therapist [place]   Pager [facility] [address] [place], [place], [address] Phone: [phone]   Fax:  [phone]  Name: JARRIEL PAPILLION MRN: [id number] Date of Birth: 10-18-1940

## 2017-10-23 ENCOUNTER — Ambulatory Visit: Payer: Medicare Other

## 2017-10-23 DIAGNOSIS — R262 Difficulty in walking, not elsewhere classified: Secondary | ICD-10-CM | POA: Diagnosis not present

## 2017-10-23 DIAGNOSIS — R600 Localized edema: Secondary | ICD-10-CM

## 2017-10-23 DIAGNOSIS — M6281 Muscle weakness (generalized): Secondary | ICD-10-CM | POA: Diagnosis not present

## 2017-10-23 DIAGNOSIS — Z85828 Personal history of other malignant neoplasm of skin: Secondary | ICD-10-CM | POA: Diagnosis not present

## 2017-10-23 DIAGNOSIS — L02414 Cutaneous abscess of left upper limb: Secondary | ICD-10-CM | POA: Diagnosis not present

## 2017-10-23 DIAGNOSIS — M25561 Pain in right knee: Secondary | ICD-10-CM

## 2017-10-23 DIAGNOSIS — M25661 Stiffness of right knee, not elsewhere classified: Secondary | ICD-10-CM | POA: Diagnosis not present

## 2017-10-23 DIAGNOSIS — L720 Epidermal cyst: Secondary | ICD-10-CM | POA: Diagnosis not present

## 2017-10-23 NOTE — Therapy (Signed)
[place], [place], [address] Phone: [phone]   Fax:  [phone]  Physical Therapy Treatment  Patient Details  Name: Carlos Juarez MRN: [id number] Date of Birth: [birth year] Referring Provider: Mcarthur Rossetti    Encounter Date: 10/23/2017  PT End of Session - 10/23/17 1207    Visit Number  2    Number of Visits  20    Date for PT Re-Evaluation  11/18/17    Authorization Type  Medicare/TRICARE (PN visit 75, KX visit 48)    Authorization Time Period  10/21/17 to 12/22/17    Authorization - Visit Number  2    Authorization - Number of Visits  10    PT Start Time  1147    PT Stop Time  1230    PT Time Calculation (min)  43 min    Activity Tolerance  Patient tolerated treatment well    Behavior During Therapy  [facility] for tasks assessed/performed       Past Medical History:  Diagnosis Date  . Arthritis   . Cancer ([place])    skin cancer removed rom nose non melanoma  . HTN (hypertension)   . Hypercholesteremia   . Hypothyroidism   . Pneumonia   . PVC's (premature ventricular contractions)   . Synovial cyst   . Trigger middle finger    right    Past Surgical History:  Procedure Laterality Date  . CARPAL TUNNEL RELEASE    . HERNIA REPAIR     as a child r inguinal  . JOINT REPLACEMENT     TKA Dr. [name] 03/28/17  . LUMBAR LAMINECTOMY/DECOMPRESSION MICRODISCECTOMY N/A 12/25/2016   Procedure: Laminectomy and Foraminotomy - Lumbar two-Lumbar three - Lumbar three-Lumbar four, resection of synovial cyst;  Surgeon: Eustace Moore, MD;  Location: [place];  Service: Neurosurgery;  Laterality: N/A;  . MENISCUS REPAIR Left   . STERIOD INJECTION Right 10/07/2017   Procedure: RIGHT MIDDLE TRIGGER FINGER INJECTION;  Surgeon: Mcarthur Rossetti, MD;  Location: [place];  Service: Orthopedics;  Laterality: Right;  . synovial cyst removed     12/25/16  . TONSILLECTOMY    . TOTAL KNEE ARTHROPLASTY Left  03/28/2017   Procedure: LEFT TOTAL KNEE ARTHROPLASTY;  Surgeon: Mcarthur Rossetti, MD;  [facility];  Service: Orthopedics;  Laterality: Left;  . TOTAL KNEE ARTHROPLASTY Right 10/07/2017   Procedure: RIGHT TOTAL KNEE ARTHROPLASTY;  Surgeon: Mcarthur Rossetti, MD;  Location: [place];  Service: Orthopedics;  Laterality: Right;    There were no vitals filed for this visit.  Subjective Assessment - 10/23/17 1206    Pain Score  6     Pain Location  Knee    Pain Orientation  Right    Pain Descriptors / Indicators  Aching    Pain Type  Surgical pain    Pain Onset  1 to 4 weeks ago    Pain Frequency  Constant                       [facility] Adult PT Treatment/Exercise - 10/23/17 0001      Exercises   Exercises  Knee/Hip      Knee/Hip Exercises: Stretches   Knee: Self-Stretch Limitations  x 5 90/90 position assisted by PT.     Gastroc Stretch  Right;60 seconds;1 rep    Gastroc Stretch Limitations  on tilt board and 30 sec foot on wall and runners stretch each  Knee/Hip Exercises: Aerobic   Nustep  8 min LE       Knee/Hip Exercises: Standing   Wall Squat  10 reps;5 seconds    Other Standing Knee Exercises  foot slide backward RT and LT x 10  cued to not slide foot back too far so can maintain control /on support leg       Knee/Hip Exercises: Supine   Quad Sets  Right;Limitations    Quad Sets Limitations  5 sec x 25    Short Arc Quad Sets  Right;20 reps      Manual Therapy   Manual Therapy  Passive ROM;Joint mobilization    Passive ROM  extesnion RT knee 20-30 sec stretch x 6 , along with patella mobs.                PT Short Term Goals - 10/21/17 1235      PT SHORT TERM GOAL #1   Title  Patient to be compliant with appropriate HEP, to be updated PRN     Time  4    Period  Weeks    Status  New    Target Date  11/18/17      PT SHORT TERM GOAL #2   Title  Patient to improve L knee AROM to 0-115 degrees in order to improve gross knee ROM  and general biomechanics     Time  4    Period  Weeks    Status  New      PT SHORT TERM GOAL #3   Title  Patient to demonstrate ability to ambulate without assistive device and consistent heel-toe pattern, equal step lengths/stance times, in order to improve general mobility     Time  4    Period  Weeks    Status  New      PT SHORT TERM GOAL #4   Title  Patient to report R knee pain as being no more than 2/10 in order to show improvement of general condition     Time  4    Period  Weeks    Status  New        PT Long Term Goals - 10/21/17 1241      PT LONG TERM GOAL #1   Title  Patient to demonstrate MMT as being 5/5 in all tested muscle groups in order to improve gross knee stability and show improved strength     Time  8    Period  Weeks    Status  New    Target Date  12/16/17      PT LONG TERM GOAL #2   Title  Patient to be able to reciprocally ascend/descend full flight of stairs with U railing, good eccentric control, and minimal unsteadiness in order to improve access to home and community     Time  8    Period  Weeks    Status  New      PT LONG TERM GOAL #3   Title  Patient to be able to complete mock-bowling task with Mod(I) in order to demonstrate strength and balance needed to safely complete this activity independently     Time  8    Period  Weeks    Status  New      PT LONG TERM GOAL #4   Title  Patient to return to regular exercise programming with personal trainer and no increase in pain in order to asisst in return to PLOF based activities  Time  8    Period  Weeks    Status  New            Plan - 10/23/17 1207    Clinical Impression Statement  Tolerated session with no significant incr pain but he had to leave and stated he would ice at home.   No changes as we initiatied exercies and ROM.     Continue to progress per tolerance of pain.     PT Treatment/Interventions  ADLs/Self Care Home Management;Biofeedback;Cryotherapy;Electrical  Stimulation;Iontophoresis 4mg /ml Dexamethasone;Moist Heat;Ultrasound;DME Instruction;Stair training;Functional mobility training;Gait training;Therapeutic activities;Therapeutic exercise;Balance training;Neuromuscular re-education;Patient/family education;Manual techniques;Scar mobilization;Passive range of motion;Dry needling;Taping    PT Next Visit Plan   initial focus on ROM/edema and pain control, progress to advanced functional strength. Gait and stair training. Add standing exercise to HEP if not doing now    PT Home Exercise Plan  continue with HHPT HEP as it remains appropriate     Consulted and Agree with Plan of Care  Patient       Patient will benefit from skilled therapeutic intervention in order to improve the following deficits and impairments:  Abnormal gait, Increased fascial restricitons, Improper body mechanics, Pain, Decreased coordination, Decreased mobility, Decreased scar mobility, Increased muscle spasms, Postural dysfunction, Decreased activity tolerance, Decreased range of motion, Decreased strength, Hypomobility, Decreased balance, Difficulty walking, Impaired flexibility  Visit Diagnosis: Acute pain of right knee  Stiffness of right knee, not elsewhere classified  Localized edema  Muscle weakness (generalized)  Difficulty in walking, not elsewhere classified     Problem List Patient Active Problem List   Diagnosis Date Noted  . Status post total knee replacement, right 10/07/2017  . Unilateral primary osteoarthritis, right knee 07/14/2017  . History of left knee replacement 07/14/2017  . Trigger finger, right middle finger 06/09/2017  . Carpal tunnel syndrome, left upper limb 06/09/2017  . Status post total left knee replacement 03/28/2017  . Unilateral primary osteoarthritis, left knee 02/19/2017  . Chronic pain of left knee 02/19/2017  . S/P lumbar laminectomy 12/25/2016  . 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 HTN (hypertension)   . Hypercholesteremia    . Hypothyroidism   . PVC's (premature ventricular contractions)     Darrel Hoover  PT 10/23/2017, 12:39 PM  [facility] [address] [place], [place], [address] Phone: [phone]   Fax:  [phone]  Name: Carlos Juarez MRN: [id number] Date of Birth: May 16, 1940

## 2017-10-24 ENCOUNTER — Other Ambulatory Visit: Payer: Self-pay | Admitting: Orthopaedic Surgery

## 2017-10-27 ENCOUNTER — Ambulatory Visit: Payer: Medicare Other | Admitting: Physical Therapy

## 2017-10-27 ENCOUNTER — Encounter: Payer: Self-pay | Admitting: Physical Therapy

## 2017-10-27 DIAGNOSIS — M6281 Muscle weakness (generalized): Secondary | ICD-10-CM

## 2017-10-27 DIAGNOSIS — M25661 Stiffness of right knee, not elsewhere classified: Secondary | ICD-10-CM

## 2017-10-27 DIAGNOSIS — R600 Localized edema: Secondary | ICD-10-CM | POA: Diagnosis not present

## 2017-10-27 DIAGNOSIS — M25561 Pain in right knee: Secondary | ICD-10-CM

## 2017-10-27 DIAGNOSIS — R262 Difficulty in walking, not elsewhere classified: Secondary | ICD-10-CM | POA: Diagnosis not present

## 2017-10-27 NOTE — Therapy (Signed)
[place], [place], [address] Phone: [phone]   Fax:  [phone]  Physical Therapy Treatment  Patient Details  Name: Carlos Juarez MRN: [id number] Date of Birth: Apr 06, 1941 Referring Provider: Mcarthur Rossetti    Encounter Date: 10/27/2017  PT End of Session - 10/27/17 1230    Visit Number  3    Number of Visits  20    Date for PT Re-Evaluation  11/18/17    Authorization Type  Medicare/TRICARE (PN visit 9, KX visit 83)    Authorization Time Period  10/21/17 to 12/22/17    PT Start Time  1148    PT Stop Time  1240    PT Time Calculation (min)  52 min    Activity Tolerance  Patient tolerated treatment well    Behavior During Therapy  [facility] for tasks assessed/performed       Past Medical History:  Diagnosis Date  . Arthritis   . Cancer ([place])    skin cancer removed rom nose non melanoma  . HTN (hypertension)   . Hypercholesteremia   . Hypothyroidism   . Pneumonia   . PVC's (premature ventricular contractions)   . Synovial cyst   . Trigger middle finger    right    Past Surgical History:  Procedure Laterality Date  . CARPAL TUNNEL RELEASE    . HERNIA REPAIR     as a child r inguinal  . JOINT REPLACEMENT     TKA Dr. [name] 03/28/17  . LUMBAR LAMINECTOMY/DECOMPRESSION MICRODISCECTOMY N/A 12/25/2016   Procedure: Laminectomy and Foraminotomy - Lumbar two-Lumbar three - Lumbar three-Lumbar four, resection of synovial cyst;  Surgeon: Eustace Moore, MD;  Location: [place];  Service: Neurosurgery;  Laterality: N/A;  . MENISCUS REPAIR Left   . STERIOD INJECTION Right 10/07/2017   Procedure: RIGHT MIDDLE TRIGGER FINGER INJECTION;  Surgeon: Mcarthur Rossetti, MD;  Location: [place];  Service: Orthopedics;  Laterality: Right;  . synovial cyst removed     12/25/16  . TONSILLECTOMY    . TOTAL KNEE ARTHROPLASTY Left 03/28/2017   Procedure: LEFT TOTAL KNEE ARTHROPLASTY;  Surgeon: Mcarthur Rossetti, MD;  [facility];  Service: Orthopedics;  Laterality: Left;  . TOTAL KNEE ARTHROPLASTY Right 10/07/2017   Procedure: RIGHT TOTAL KNEE ARTHROPLASTY;  Surgeon: Mcarthur Rossetti, MD;  Location: [place];  Service: Orthopedics;  Laterality: Right;    There were no vitals filed for this visit.  Subjective Assessment - 10/27/17 1156    Subjective  Not a whole of pain 2/10.      Currently in Pain?  Yes    Pain Score  2          [facility] PT Assessment - 10/27/17 0001      AROM   Right Knee Extension  12    Right Knee Flexion  90         [facility] Adult PT Treatment/Exercise - 10/27/17 0001      Knee/Hip Exercises: Stretches   Active Hamstring Stretch  Right;5 reps    Knee: Self-Stretch to increase Flexion  5 reps      Knee/Hip Exercises: Aerobic   Nustep  8 min LE Level 7       Knee/Hip Exercises: Supine   Quad Sets  Strengthening;Right;1 set    Short Arc Quad Sets  Strengthening;Right;1 set;20 reps    Straight Leg Raises  Strengthening;Right;1 set;10 reps    Straight Leg Raise with External Rotation  Strengthening;Right;1 set;10 reps    Patellar Mobs  gentle       Knee/Hip Exercises: Sidelying   Hip ABduction  Strengthening;Right;2 sets;10 reps      Vasopneumatic   Number Minutes Vasopneumatic   15 minutes    Vasopnuematic Location   Knee    Vasopneumatic Pressure  Medium    Vasopneumatic Temperature   max       Manual Therapy   Manual Therapy  Joint mobilization;Passive ROM    Joint Mobilization  flexion, ext seated and supine     Passive ROM  flex/ext , seated and supine              PT Education - 10/27/17 1230    Education Details  knee ROM     Person(s) Educated  Patient    Methods  Explanation    Comprehension  Verbalized understanding;Returned demonstration       PT Short Term Goals - 10/27/17 1231      PT SHORT TERM GOAL #1   Title  Patient to be compliant with appropriate HEP, to be updated PRN     Baseline  "pretty good  about it"     Status  Partially Met      PT SHORT TERM GOAL #2   Title  Patient to improve L knee AROM to 0-115 degrees in order to improve gross knee ROM and general biomechanics     Status  On-going      PT SHORT TERM GOAL #3   Title  Patient to demonstrate ability to ambulate without assistive device and consistent heel-toe pattern, equal step lengths/stance times, in order to improve general mobility     Status  On-going      PT SHORT TERM GOAL #4   Title  Patient to report R knee pain as being no more than 2/10 in order to show improvement of general condition     Status  On-going        PT Long Term Goals - 10/21/17 1241      PT LONG TERM GOAL #1   Title  Patient to demonstrate MMT as being 5/5 in all tested muscle groups in order to improve gross knee stability and show improved strength     Time  8    Period  Weeks    Status  New    Target Date  12/16/17      PT LONG TERM GOAL #2   Title  Patient to be able to reciprocally ascend/descend full flight of stairs with U railing, good eccentric control, and minimal unsteadiness in order to improve access to home and community     Time  8    Period  Weeks    Status  New      PT LONG TERM GOAL #3   Title  Patient to be able to complete mock-bowling task with Mod(I) in order to demonstrate strength and balance needed to safely complete this activity independently     Time  8    Period  Weeks    Status  New      PT LONG TERM GOAL #4   Title  Patient to return to regular exercise programming with personal trainer and no increase in pain in order to asisst in return to PLOF based activities     Time  8    Period  Weeks    Status  New            Plan -  10/27/17 1231    Clinical Impression Statement  Pt doing well, did not push too hard with manual as he got to 90 deg with assistance of strap.  Does his stretching daily.  Plans to use his bike at home.     PT Treatment/Interventions  ADLs/Self Care Home  Management;Biofeedback;Cryotherapy;Electrical Stimulation;Iontophoresis 16m/ml Dexamethasone;Moist Heat;Ultrasound;DME Instruction;Stair training;Functional mobility training;Gait training;Therapeutic activities;Therapeutic exercise;Balance training;Neuromuscular re-education;Patient/family education;Manual techniques;Scar mobilization;Passive range of motion;Dry needling;Taping    PT Next Visit Plan   initial focus on ROM/edema and pain control, progress to advanced functional strength. Gait and stair training. Add standing exercise to HEP if not doing now    PT Home Exercise Plan  continue with HHPT HEP as it remains appropriate     Consulted and Agree with Plan of Care  Patient       Patient will benefit from skilled therapeutic intervention in order to improve the following deficits and impairments:  Abnormal gait, Increased fascial restricitons, Improper body mechanics, Pain, Decreased coordination, Decreased mobility, Decreased scar mobility, Increased muscle spasms, Postural dysfunction, Decreased activity tolerance, Decreased range of motion, Decreased strength, Hypomobility, Decreased balance, Difficulty walking, Impaired flexibility  Visit Diagnosis: Acute pain of right knee  Stiffness of right knee, not elsewhere classified  Localized edema  Muscle weakness (generalized)  Difficulty in walking, not elsewhere classified     Problem List Patient Active Problem List   Diagnosis Date Noted  . Status post total knee replacement, right 10/07/2017  . Unilateral primary osteoarthritis, right knee 07/14/2017  . History of left knee replacement 07/14/2017  . Trigger finger, right middle finger 06/09/2017  . Carpal tunnel syndrome, left upper limb 06/09/2017  . Status post total left knee replacement 03/28/2017  . Unilateral primary osteoarthritis, left knee 02/19/2017  . Chronic pain of left knee 02/19/2017  . S/P lumbar laminectomy 12/25/2016  . Cervical spondylosis 08/20/2011   . HTN (hypertension)   . Hypercholesteremia   . Hypothyroidism   . PVC's (premature ventricular contractions)     Eveny Anastas 10/27/2017, 12:34 PM  [place] [address]Phone: [phone]  Fax:  [phone] Name: WKHANH TANORIMRN: [id number] of Birth: [birth year]  JRaeford Razor PT 10/27/17 12:34 PM Phone: [phone]: [phone]

## 2017-10-28 ENCOUNTER — Ambulatory Visit: Payer: Medicare Other

## 2017-10-28 DIAGNOSIS — M6281 Muscle weakness (generalized): Secondary | ICD-10-CM

## 2017-10-28 DIAGNOSIS — M25661 Stiffness of right knee, not elsewhere classified: Secondary | ICD-10-CM

## 2017-10-28 DIAGNOSIS — R600 Localized edema: Secondary | ICD-10-CM | POA: Diagnosis not present

## 2017-10-28 DIAGNOSIS — M25561 Pain in right knee: Secondary | ICD-10-CM

## 2017-10-28 DIAGNOSIS — R262 Difficulty in walking, not elsewhere classified: Secondary | ICD-10-CM | POA: Diagnosis not present

## 2017-10-28 NOTE — Therapy (Signed)
[place], [place], [address] Phone: [phone]   Fax:  [phone]  Physical Therapy Treatment  Patient Details  Name: Carlos Juarez MRN: [id number] Date of Birth: 1941-03-14 Referring Provider: Mcarthur Rossetti    Encounter Date: 10/28/2017  PT End of Session - 10/28/17 1725    Visit Number  4    Number of Visits  20    Date for PT Re-Evaluation  11/18/17    Authorization Type  Medicare/TRICARE (PN visit 46, KX visit 59)    Authorization Time Period  10/21/17 to 12/22/17    Authorization - Visit Number  3    Authorization - Number of Visits  10    PT Start Time  0430    PT Stop Time  0530    PT Time Calculation (min)  60 min    Activity Tolerance  Patient tolerated treatment well    Behavior During Therapy  [facility] for tasks assessed/performed       Past Medical History:  Diagnosis Date  . Arthritis   . Cancer ([place])    skin cancer removed rom nose non melanoma  . HTN (hypertension)   . Hypercholesteremia   . Hypothyroidism   . Pneumonia   . PVC's (premature ventricular contractions)   . Synovial cyst   . Trigger middle finger    right    Past Surgical History:  Procedure Laterality Date  . CARPAL TUNNEL RELEASE    . HERNIA REPAIR     as a child r inguinal  . JOINT REPLACEMENT     TKA Dr. [name] 03/28/17  . LUMBAR LAMINECTOMY/DECOMPRESSION MICRODISCECTOMY N/A 12/25/2016   Procedure: Laminectomy and Foraminotomy - Lumbar two-Lumbar three - Lumbar three-Lumbar four, resection of synovial cyst;  Surgeon: Eustace Moore, MD;  Location: [place];  Service: Neurosurgery;  Laterality: N/A;  . MENISCUS REPAIR Left   . STERIOD INJECTION Right 10/07/2017   Procedure: RIGHT MIDDLE TRIGGER FINGER INJECTION;  Surgeon: Mcarthur Rossetti, MD;  Location: [place];  Service: Orthopedics;  Laterality: Right;  . synovial cyst removed     12/25/16  . TONSILLECTOMY    . TOTAL KNEE ARTHROPLASTY Left  03/28/2017   Procedure: LEFT TOTAL KNEE ARTHROPLASTY;  Surgeon: Mcarthur Rossetti, MD;  [facility];  Service: Orthopedics;  Laterality: Left;  . TOTAL KNEE ARTHROPLASTY Right 10/07/2017   Procedure: RIGHT TOTAL KNEE ARTHROPLASTY;  Surgeon: Mcarthur Rossetti, MD;  Location: [place];  Service: Orthopedics;  Laterality: Right;    There were no vitals filed for this visit.  Subjective Assessment - 10/28/17 1644    Subjective  Took painmeds late so more pain.     Currently in Pain?  Yes    Pain Score  4     Pain Location  Knee    Pain Orientation  Right    Pain Descriptors / Indicators  Aching    Pain Type  Surgical pain    Pain Frequency  Constant                       [facility] Adult PT Treatment/Exercise - 10/28/17 0001      Knee/Hip Exercises: Aerobic   Nustep  8 min LE Level 7       Knee/Hip Exercises: Standing   Heel Raises  Both;15 reps    Hip Abduction  Right;Left;20 reps    Forward Step Up  Right;15 reps;Hand Hold: 2;Step Height: 8"  Functional Squat  15 reps    Other Standing Knee Exercises  Stairs ascend step ove step  6 inch step and descend 4 inch step step over step x 1 2steps then step down and up with RT foot on step. x 10      Knee/Hip Exercises: Seated   Long Arc Quad  Right;15 reps;Weights    Long Arc Quad Weight  4 lbs.  then 10 reps with incr pressure into PT hand      Knee/Hip Exercises: Supine   Quad Sets Limitations  5x10      Vasopneumatic   Number Minutes Vasopneumatic   15 minutes    Vasopnuematic Location   Knee    Vasopneumatic Pressure  Medium    Vasopneumatic Temperature   max       Manual Therapy   Passive ROM  flex > ext  supine              PT Education - 10/27/17 1230    Education Details  knee ROM     Person(s) Educated  Patient    Methods  Explanation    Comprehension  Verbalized understanding;Returned demonstration       PT Short Term Goals - 10/27/17 1231      PT SHORT TERM GOAL #1   Title   Patient to be compliant with appropriate HEP, to be updated PRN     Baseline  "pretty good about it"     Status  Partially Met      PT SHORT TERM GOAL #2   Title  Patient to improve L knee AROM to 0-115 degrees in order to improve gross knee ROM and general biomechanics     Status  On-going      PT SHORT TERM GOAL #3   Title  Patient to demonstrate ability to ambulate without assistive device and consistent heel-toe pattern, equal step lengths/stance times, in order to improve general mobility     Status  On-going      PT SHORT TERM GOAL #4   Title  Patient to report R knee pain as being no more than 2/10 in order to show improvement of general condition     Status  On-going        PT Long Term Goals - 10/21/17 1241      PT LONG TERM GOAL #1   Title  Patient to demonstrate MMT as being 5/5 in all tested muscle groups in order to improve gross knee stability and show improved strength     Time  8    Period  Weeks    Status  New    Target Date  12/16/17      PT LONG TERM GOAL #2   Title  Patient to be able to reciprocally ascend/descend full flight of stairs with U railing, good eccentric control, and minimal unsteadiness in order to improve access to home and community     Time  8    Period  Weeks    Status  New      PT LONG TERM GOAL #3   Title  Patient to be able to complete mock-bowling task with Mod(I) in order to demonstrate strength and balance needed to safely complete this activity independently     Time  8    Period  Weeks    Status  New      PT LONG TERM GOAL #4   Title  Patient to return to regular exercise programming with  personal trainer and no increase in pain in order to asisst in return to PLOF based activities     Time  8    Period  Weeks    Status  New            Plan - 10/28/17 1725    Clinical Impression Statement  More pain today but he was able to tolerate all activity. Noted swelling of knee and all RT lower leg. Suggested wearing suppport  hose may be of some benefit to control swelling and may improve ROM.       PT Treatment/Interventions  ADLs/Self Care Home Management;Biofeedback;Cryotherapy;Electrical Stimulation;Iontophoresis 73m/ml Dexamethasone;Moist Heat;Ultrasound;DME Instruction;Stair training;Functional mobility training;Gait training;Therapeutic activities;Therapeutic exercise;Balance training;Neuromuscular re-education;Patient/family education;Manual techniques;Scar mobilization;Passive range of motion;Dry needling;Taping    PT Next Visit Plan   initial focus on ROM/edema and pain control, progress to advanced functional strength. Gait and stair training. Add standing exercise to HEP if not doing now    PT Home Exercise Plan  continue with HHPT HEP as it remains appropriate     Consulted and Agree with Plan of Care  Patient       Patient will benefit from skilled therapeutic intervention in order to improve the following deficits and impairments:  Abnormal gait, Increased fascial restricitons, Improper body mechanics, Pain, Decreased coordination, Decreased mobility, Decreased scar mobility, Increased muscle spasms, Postural dysfunction, Decreased activity tolerance, Decreased range of motion, Decreased strength, Hypomobility, Decreased balance, Difficulty walking, Impaired flexibility  Visit Diagnosis: Acute pain of right knee  Stiffness of right knee, not elsewhere classified  Localized edema  Muscle weakness (generalized)  Difficulty in walking, not elsewhere classified     Problem List Patient Active Problem List   Diagnosis Date Noted  . Status post total knee replacement, right 10/07/2017  . Unilateral primary osteoarthritis, right knee 07/14/2017  . History of left knee replacement 07/14/2017  . Trigger finger, right middle finger 06/09/2017  . Carpal tunnel syndrome, left upper limb 06/09/2017  . Status post total left knee replacement 03/28/2017  . Unilateral primary osteoarthritis, left knee  02/19/2017  . Chronic pain of left knee 02/19/2017  . S/P lumbar laminectomy 12/25/2016  . 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 HTN (hypertension)   . Hypercholesteremia   . Hypothyroidism   . PVC's (premature ventricular contractions)     CDarrel HooverPT 10/28/2017, 5:34 PM  [facility] [address]Phone: [phone]  Fax:  [phone] Name: Carlos TUMINELLOMRN: [id number] of Birth: 309-11-1940

## 2017-10-30 ENCOUNTER — Ambulatory Visit: Payer: Medicare Other | Admitting: Physical Therapy

## 2017-10-30 ENCOUNTER — Encounter: Payer: Self-pay | Admitting: Physical Therapy

## 2017-10-30 DIAGNOSIS — R262 Difficulty in walking, not elsewhere classified: Secondary | ICD-10-CM

## 2017-10-30 DIAGNOSIS — R600 Localized edema: Secondary | ICD-10-CM | POA: Diagnosis not present

## 2017-10-30 DIAGNOSIS — M25661 Stiffness of right knee, not elsewhere classified: Secondary | ICD-10-CM | POA: Diagnosis not present

## 2017-10-30 DIAGNOSIS — M6281 Muscle weakness (generalized): Secondary | ICD-10-CM | POA: Diagnosis not present

## 2017-10-30 DIAGNOSIS — M25561 Pain in right knee: Secondary | ICD-10-CM

## 2017-10-30 NOTE — Therapy (Signed)
[place], [place], [address] Phone: [phone]   Fax:  [phone]  Physical Therapy Treatment  Patient Details  Name: Carlos Juarez MRN: [id number] Date of Birth: [birth year] Referring Provider: Mcarthur Rossetti    Encounter Date: 10/30/2017  PT End of Session - 10/30/17 1425    Visit Number  5    Number of Visits  20    Date for PT Re-Evaluation  11/18/17    Authorization Type  Medicare/TRICARE (PN visit 10, KX visit 50)    PT Start Time  1332    PT Stop Time  1430    PT Time Calculation (min)  58 min    Activity Tolerance  Patient tolerated treatment well    Behavior During Therapy  [facility] for tasks assessed/performed       Past Medical History:  Diagnosis Date  . Arthritis   . Cancer ([place])    skin cancer removed rom nose non melanoma  . HTN (hypertension)   . Hypercholesteremia   . Hypothyroidism   . Pneumonia   . PVC's (premature ventricular contractions)   . Synovial cyst   . Trigger middle finger    right    Past Surgical History:  Procedure Laterality Date  . CARPAL TUNNEL RELEASE    . HERNIA REPAIR     as a child r inguinal  . JOINT REPLACEMENT     TKA Dr. [name] 03/28/17  . LUMBAR LAMINECTOMY/DECOMPRESSION MICRODISCECTOMY N/A 12/25/2016   Procedure: Laminectomy and Foraminotomy - Lumbar two-Lumbar three - Lumbar three-Lumbar four, resection of synovial cyst;  Surgeon: Eustace Moore, MD;  Location: [place];  Service: Neurosurgery;  Laterality: N/A;  . MENISCUS REPAIR Left   . STERIOD INJECTION Right 10/07/2017   Procedure: RIGHT MIDDLE TRIGGER FINGER INJECTION;  Surgeon: Mcarthur Rossetti, MD;  Location: [place];  Service: Orthopedics;  Laterality: Right;  . synovial cyst removed     12/25/16  . TONSILLECTOMY    . TOTAL KNEE ARTHROPLASTY Left 03/28/2017   Procedure: LEFT TOTAL KNEE ARTHROPLASTY;  Surgeon: Mcarthur Rossetti, MD;  Location: [facility];  Service:  Orthopedics;  Laterality: Left;  . TOTAL KNEE ARTHROPLASTY Right 10/07/2017   Procedure: RIGHT TOTAL KNEE ARTHROPLASTY;  Surgeon: Mcarthur Rossetti, MD;  Location: [place];  Service: Orthopedics;  Laterality: Right;    There were no vitals filed for this visit.  Subjective Assessment - 10/30/17 1330    Subjective  "I am doing pretty good, some soreness in the R knee about a 3-4/10"    Currently in Pain?  Yes    Pain Score  3     Pain Orientation  Right    Pain Descriptors / Indicators  Aching    Pain Type  Surgical pain    Pain Onset  1 to 4 weeks ago    Pain Frequency  Constant    Aggravating Factors   doing too much     Pain Relieving Factors  pain medicine         [facility] PT Assessment - 10/30/17 0001      AROM   Right Knee Flexion  92 following manual 97                   [facility] Adult PT Treatment/Exercise - 10/30/17 1339      Knee/Hip Exercises: Stretches   Active Hamstring Stretch  30 seconds;3 reps    Quad Stretch  30 seconds;3 reps PNF  contract relax stretch      Knee/Hip Exercises: Aerobic   Stationary Bike  L1 x 6 min pt able to get full revolutions       Knee/Hip Exercises: Machines for Strengthening   Cybex Leg Press  2 x 12 20# sled lowered between sets. verbal cues to push R knee straig      Knee/Hip Exercises: Standing   Forward Step Up  2 sets;10 reps;Step Height: 4";Hand Hold: 1;Right    Other Standing Knee Exercises  standing weight shifting forward / backward with RLE leading adding DF and knee extension with posterior rocking 1 x 50  for involuntary quad activation      Vasopneumatic   Number Minutes Vasopneumatic   15 minutes    Vasopnuematic Location   Knee    Vasopneumatic Pressure  Medium    Vasopneumatic Temperature   34      Manual Therapy   Joint Mobilization  A>P grade 3 with pt active flexion to knee range between sets.      Passive ROM  forced flexion of R knee with L knee extension for contralateral reciprocal inhibition  technique               PT Short Term Goals - 10/27/17 1231      PT SHORT TERM GOAL #1   Title  Patient to be compliant with appropriate HEP, to be updated PRN     Baseline  "pretty good about it"     Status  Partially Met      PT SHORT TERM GOAL #2   Title  Patient to improve L knee AROM to 0-115 degrees in order to improve gross knee ROM and general biomechanics     Status  On-going      PT SHORT TERM GOAL #3   Title  Patient to demonstrate ability to ambulate without assistive device and consistent heel-toe pattern, equal step lengths/stance times, in order to improve general mobility     Status  On-going      PT SHORT TERM GOAL #4   Title  Patient to report R knee pain as being no more than 2/10 in order to show improvement of general condition     Status  On-going        PT Long Term Goals - 10/21/17 1241      PT LONG TERM GOAL #1   Title  Patient to demonstrate MMT as being 5/5 in all tested muscle groups in order to improve gross knee stability and show improved strength     Time  8    Period  Weeks    Status  New    Target Date  12/16/17      PT LONG TERM GOAL #2   Title  Patient to be able to reciprocally ascend/descend full flight of stairs with U railing, good eccentric control, and minimal unsteadiness in order to improve access to home and community     Time  8    Period  Weeks    Status  New      PT LONG TERM GOAL #3   Title  Patient to be able to complete mock-bowling task with Mod(I) in order to demonstrate strength and balance needed to safely complete this activity independently     Time  8    Period  Weeks    Status  New      PT LONG TERM GOAL #4   Title  Patient to return to  regular exercise programming with personal trainer and no increase in pain in order to asisst in return to PLOF based activities     Time  8    Period  Weeks    Status  New            Plan - 10/30/17 1421    Clinical Impression Statement  pt repored 3-4/10  pain today. he is improving knee flexion strating at 92 and improved to 97 following manual today. continued nueromuscular quad activation and hip / knee strength which he performed well. continued vaso end of session.     PT Treatment/Interventions  ADLs/Self Care Home Management;Biofeedback;Cryotherapy;Electrical Stimulation;Iontophoresis 27m/ml Dexamethasone;Moist Heat;Ultrasound;DME Instruction;Stair training;Functional mobility training;Gait training;Therapeutic activities;Therapeutic exercise;Balance training;Neuromuscular re-education;Patient/family education;Manual techniques;Scar mobilization;Passive range of motion;Dry needling;Taping    PT Next Visit Plan   initial focus on ROM/edema and pain control, progress to advanced functional strength. Gait and stair training. update HEP PRN.     PT Home Exercise Plan  continue with HHPT HEP as it remains appropriate     Consulted and Agree with Plan of Care  Patient       Patient will benefit from skilled therapeutic intervention in order to improve the following deficits and impairments:  Abnormal gait, Increased fascial restricitons, Improper body mechanics, Pain, Decreased coordination, Decreased mobility, Decreased scar mobility, Increased muscle spasms, Postural dysfunction, Decreased activity tolerance, Decreased range of motion, Decreased strength, Hypomobility, Decreased balance, Difficulty walking, Impaired flexibility  Visit Diagnosis: Acute pain of right knee  Stiffness of right knee, not elsewhere classified  Localized edema  Muscle weakness (generalized)  Difficulty in walking, not elsewhere classified     Problem List Patient Active Problem List   Diagnosis Date Noted  . Status post total knee replacement, right 10/07/2017  . Unilateral primary osteoarthritis, right knee 07/14/2017  . History of left knee replacement 07/14/2017  . Trigger finger, right middle finger 06/09/2017  . Carpal tunnel syndrome, left upper limb  06/09/2017  . Status post total left knee replacement 03/28/2017  . Unilateral primary osteoarthritis, left knee 02/19/2017  . Chronic pain of left knee 02/19/2017  . S/P lumbar laminectomy 12/25/2016  . 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 HTN (hypertension)   . Hypercholesteremia   . Hypothyroidism   . PVC's (premature ventricular contractions)    KStarr LakePT, DPT, LAT, ATC  10/30/17  2:31 PM      [facility] [address]Phone: [phone]  Fax:  [phone] Name: Carlos GLASSCOMRN: [id number] of Birth: 3April 09, 1942

## 2017-11-03 ENCOUNTER — Ambulatory Visit: Payer: Medicare Other | Admitting: Physical Therapy

## 2017-11-03 ENCOUNTER — Encounter: Payer: Self-pay | Admitting: Physical Therapy

## 2017-11-03 DIAGNOSIS — R600 Localized edema: Secondary | ICD-10-CM | POA: Diagnosis not present

## 2017-11-03 DIAGNOSIS — M25561 Pain in right knee: Secondary | ICD-10-CM | POA: Diagnosis not present

## 2017-11-03 DIAGNOSIS — R262 Difficulty in walking, not elsewhere classified: Secondary | ICD-10-CM | POA: Diagnosis not present

## 2017-11-03 DIAGNOSIS — M25661 Stiffness of right knee, not elsewhere classified: Secondary | ICD-10-CM | POA: Diagnosis not present

## 2017-11-03 DIAGNOSIS — M6281 Muscle weakness (generalized): Secondary | ICD-10-CM

## 2017-11-03 NOTE — Therapy (Addendum)
[place], [place], [address] Phone: [phone]   Fax:  [phone]  Physical Therapy Treatment  Patient Details  Name: Carlos Juarez MRN: [id number] Date of Birth: April 14, 1941 Referring Provider: Mcarthur Rossetti    Encounter Date: 11/03/2017  PT End of Session - 11/03/17 1749    Visit Number  6    Number of Visits  20    Date for PT Re-Evaluation  11/18/17    PT Start Time  6962    PT Stop Time  1645    PT Time Calculation (min)  60 min    Activity Tolerance  Patient tolerated treatment well    Behavior During Therapy  [facility] for tasks assessed/performed       Past Medical History:  Diagnosis Date  . Arthritis   . Cancer ([place])    skin cancer removed rom nose non melanoma  . HTN (hypertension)   . Hypercholesteremia   . Hypothyroidism   . Pneumonia   . PVC's (premature ventricular contractions)   . Synovial cyst   . Trigger middle finger    right    Past Surgical History:  Procedure Laterality Date  . CARPAL TUNNEL RELEASE    . HERNIA REPAIR     as a child r inguinal  . JOINT REPLACEMENT     TKA Dr. [name] 03/28/17  . LUMBAR LAMINECTOMY/DECOMPRESSION MICRODISCECTOMY N/A 12/25/2016   Procedure: Laminectomy and Foraminotomy - Lumbar two-Lumbar three - Lumbar three-Lumbar four, resection of synovial cyst;  Surgeon: Eustace Moore, MD;  Location: [place];  Service: Neurosurgery;  Laterality: N/A;  . MENISCUS REPAIR Left   . STERIOD INJECTION Right 10/07/2017   Procedure: RIGHT MIDDLE TRIGGER FINGER INJECTION;  Surgeon: Mcarthur Rossetti, MD;  Location: [place];  Service: Orthopedics;  Laterality: Right;  . synovial cyst removed     12/25/16  . TONSILLECTOMY    . TOTAL KNEE ARTHROPLASTY Left 03/28/2017   Procedure: LEFT TOTAL KNEE ARTHROPLASTY;  Surgeon: Mcarthur Rossetti, MD;  [facility];  Service: Orthopedics;  Laterality: Left;  . TOTAL KNEE ARTHROPLASTY Right  10/07/2017   Procedure: RIGHT TOTAL KNEE ARTHROPLASTY;  Surgeon: Mcarthur Rossetti, MD;  Location: [place];  Service: Orthopedics;  Laterality: Right;    There were no vitals filed for this visit.  Subjective Assessment - 11/03/17 1540    Subjective  "I feel wonderful."    Currently in Pain?  Yes    Pain Score  2     Pain Location  Knee    Pain Orientation  Right    Pain Descriptors / Indicators  Aching         [facility] PT Assessment - 11/03/17 0001      AROM   Right Knee Flexion  100                   [facility] Adult PT Treatment/Exercise - 11/03/17 0001      Knee/Hip Exercises: Stretches   Passive Hamstring Stretch  30 seconds;2 reps;Right    Quad Stretch  60 seconds;1 rep;Right PNF contract relax      Knee/Hip Exercises: Aerobic   Stationary Bike  L1 x 6 min      Knee/Hip Exercises: Machines for Strengthening   Cybex Leg Press  2 x 15 40#      Knee/Hip Exercises: Standing   Stairs  5 x 4 steps. Cues to point toes on descent and increase pace,  Knee/Hip Exercises: Seated   Long Arc Quad  Right;10 reps;2 sets;AROM    Long Arc Quad Weight  4 lbs.    Hamstring Curl  AROM;Right;2 sets;10 reps green theraband      Knee/Hip Exercises: Supine   Straight Leg Raises  Right;2 sets;15 reps      Knee/Hip Exercises: Sidelying   Hip ABduction  Right;1 set;10 reps;AROM;Strengthening      Vasopneumatic   Number Minutes Vasopneumatic   15 minutes    Vasopnuematic Location   Knee    Vasopneumatic Pressure  Medium    Vasopneumatic Temperature   34      Manual Therapy   Joint Mobilization  tibial l A>P grade 3               PT Short Term Goals - 10/27/17 1231      PT SHORT TERM GOAL #1   Title  Patient to be compliant with appropriate HEP, to be updated PRN     Baseline  "pretty good about it"     Status  Partially Met      PT SHORT TERM GOAL #2   Title  Patient to improve L knee AROM to 0-115 degrees in order to improve gross knee ROM and general  biomechanics     Status  On-going      PT SHORT TERM GOAL #3   Title  Patient to demonstrate ability to ambulate without assistive device and consistent heel-toe pattern, equal step lengths/stance times, in order to improve general mobility     Status  On-going      PT SHORT TERM GOAL #4   Title  Patient to report R knee pain as being no more than 2/10 in order to show improvement of general condition     Status  On-going        PT Long Term Goals - 10/21/17 1241      PT LONG TERM GOAL #1   Title  Patient to demonstrate MMT as being 5/5 in all tested muscle groups in order to improve gross knee stability and show improved strength     Time  8    Period  Weeks    Status  New    Target Date  12/16/17      PT LONG TERM GOAL #2   Title  Patient to be able to reciprocally ascend/descend full flight of stairs with U railing, good eccentric control, and minimal unsteadiness in order to improve access to home and community     Time  8    Period  Weeks    Status  New      PT LONG TERM GOAL #3   Title  Patient to be able to complete mock-bowling task with Mod(I) in order to demonstrate strength and balance needed to safely complete this activity independently     Time  8    Period  Weeks    Status  New      PT LONG TERM GOAL #4   Title  Patient to return to regular exercise programming with personal trainer and no increase in pain in order to asisst in return to PLOF based activities     Time  8    Period  Weeks    Status  New            Plan - 11/03/17 1750    Clinical Impression Statement  Treatment focused on strengthening of hip and knee musculature in addition to AP  joint mobilizations of the tibia to increase knee flexion. Pt also completed stair training with minimal difficulty, although he did require cueing to increase pace. Pt increased knee flexion to 100 degrees following mobilizations and PNF stretching.     PT Treatment/Interventions  ADLs/Self Care Home  Management;Biofeedback;Cryotherapy;Electrical Stimulation;Iontophoresis 33m/ml Dexamethasone;Moist Heat;Ultrasound;DME Instruction;Stair training;Functional mobility training;Gait training;Therapeutic activities;Therapeutic exercise;Balance training;Neuromuscular re-education;Patient/family education;Manual techniques;Scar mobilization;Passive range of motion;Dry needling;Taping    PT Next Visit Plan   initial focus on ROM/edema and pain control, progress to advanced functional strength. Gait training, update HEP PRN.     PT Home Exercise Plan  continue with HHPT HEP as it remains appropriate     Consulted and Agree with Plan of Care  Patient       Patient will benefit from skilled therapeutic intervention in order to improve the following deficits and impairments:  Abnormal gait, Increased fascial restricitons, Improper body mechanics, Pain, Decreased coordination, Decreased mobility, Decreased scar mobility, Increased muscle spasms, Postural dysfunction, Decreased activity tolerance, Decreased range of motion, Decreased strength, Hypomobility, Decreased balance, Difficulty walking, Impaired flexibility  Visit Diagnosis: Acute pain of right knee  Stiffness of right knee, not elsewhere classified  Localized edema  Muscle weakness (generalized)  Difficulty in walking, not elsewhere classified     Problem List Patient Active Problem List   Diagnosis Date Noted  . Status post total knee replacement, right 10/07/2017  . Unilateral primary osteoarthritis, right knee 07/14/2017  . History of left knee replacement 07/14/2017  . Trigger finger, right middle finger 06/09/2017  . Carpal tunnel syndrome, left upper limb 06/09/2017  . Status post total left knee replacement 03/28/2017  . Unilateral primary osteoarthritis, left knee 02/19/2017  . Chronic pain of left knee 02/19/2017  . S/P lumbar laminectomy 12/25/2016  . 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 HTN (hypertension)   .  Hypercholesteremia   . Hypothyroidism   . PVC's (premature ventricular contractions)    MWorthy Flank SPT 11/03/17 6:17 PM   [facility] [address]Phone: [phone]  Fax:  [phone] Name: WBRUNO LEACHMRN: [id number] of Birth: [birth year]

## 2017-11-05 ENCOUNTER — Ambulatory Visit: Payer: Medicare Other | Admitting: Physical Therapy

## 2017-11-05 ENCOUNTER — Encounter: Payer: Self-pay | Admitting: Physical Therapy

## 2017-11-05 DIAGNOSIS — R262 Difficulty in walking, not elsewhere classified: Secondary | ICD-10-CM

## 2017-11-05 DIAGNOSIS — M25661 Stiffness of right knee, not elsewhere classified: Secondary | ICD-10-CM

## 2017-11-05 DIAGNOSIS — M25561 Pain in right knee: Secondary | ICD-10-CM | POA: Diagnosis not present

## 2017-11-05 DIAGNOSIS — R600 Localized edema: Secondary | ICD-10-CM

## 2017-11-05 DIAGNOSIS — M6281 Muscle weakness (generalized): Secondary | ICD-10-CM | POA: Diagnosis not present

## 2017-11-05 NOTE — Therapy (Addendum)
[place], [place], [address] Phone: [phone]   Fax:  [phone]  Physical Therapy Treatment  Patient Details  Name: Carlos Juarez MRN: [id number]  Date of Birth: 17-Apr-1940 Referring Provider: Mcarthur Rossetti    Encounter Date: 11/05/2017  PT End of Session - 11/05/17 1337    Visit Number  7    Number of Visits  20    Date for PT Re-Evaluation  11/18/17    Authorization Type  Medicare/TRICARE (PN visit 47, KX visit 37)    Authorization Time Period  10/21/17 to 12/22/17    PT Start Time  1330    PT Stop Time  1429    PT Time Calculation (min)  59 min    Activity Tolerance  Patient tolerated treatment well    Behavior During Therapy  [facility] for tasks assessed/performed       Past Medical History:  Diagnosis Date  . Arthritis   . Cancer ([place])    skin cancer removed rom nose non melanoma  . HTN (hypertension)   . Hypercholesteremia   . Hypothyroidism   . Pneumonia   . PVC's (premature ventricular contractions)   . Synovial cyst   . Trigger middle finger    right    Past Surgical History:  Procedure Laterality Date  . CARPAL TUNNEL RELEASE    . HERNIA REPAIR     as a child r inguinal  . JOINT REPLACEMENT     [facility] Dr. [name] 03/28/17  . LUMBAR LAMINECTOMY/DECOMPRESSION MICRODISCECTOMY N/A 12/25/2016   Procedure: Laminectomy and Foraminotomy - Lumbar two-Lumbar three - Lumbar three-Lumbar four, resection of synovial cyst;  Surgeon: Eustace Moore, MD;  Location: [place];  Service: Neurosurgery;  Laterality: N/A;  . MENISCUS REPAIR Left   . STERIOD INJECTION Right 10/07/2017   Procedure: RIGHT MIDDLE TRIGGER FINGER INJECTION;  Surgeon: Mcarthur Rossetti, MD;  Location: [place];  Service: Orthopedics;  Laterality: Right;  . synovial cyst removed     12/25/16  . TONSILLECTOMY    . TOTAL KNEE ARTHROPLASTY Left 03/28/2017   Procedure: LEFT TOTAL KNEE ARTHROPLASTY;  Surgeon: Mcarthur Rossetti, MD;  Location: [facility];  Service: Orthopedics;  Laterality: Left;  . TOTAL KNEE ARTHROPLASTY Right 10/07/2017   Procedure: RIGHT TOTAL KNEE ARTHROPLASTY;  Surgeon: Mcarthur Rossetti, MD;  Location: [facility];  Service: Orthopedics;  Laterality: Right;    There were no vitals filed for this visit.  Subjective Assessment - 11/05/17 1331    Subjective  "I feel wonderful."    Currently in Pain?  Yes    Pain Score  2     Pain Location  Knee    Pain Orientation  Right    Pain Descriptors / Indicators  Aching    Pain Type  Surgical pain    Pain Onset  1 to 4 weeks ago    Pain Frequency  Constant                       [facility] Adult PT Treatment/Exercise - 11/05/17 0001      Ambulation/Gait   Gait Comments  gait training focusing on heel to toe gait pattern, wider BOS, and increased arm swing; pt required verbal and visual cueing      Knee/Hip Exercises: Stretches   Passive Hamstring Stretch  30 seconds;2 reps;Right    Quad Stretch  60 seconds;1 rep;Right PNF contract relax; inc knee flexion inbetween each  set    Gastroc Stretch  2 reps;30 seconds;Both      Knee/Hip Exercises: Aerobic   Stationary Bike  L3 x 12mn      Knee/Hip Exercises: Machines for Strengthening   Cybex Knee Extension  3 x 10 25lbs    Cybex Knee Flexion  2 x 10 35 lbs      Knee/Hip Exercises: Supine   Bridges  AROM;Both;2 sets;10 reps      Knee/Hip Exercises: Sidelying   Hip ABduction  Right;2 sets;10 reps;AROM;Strengthening cues for hips rolled forward      Vasopneumatic   Number Minutes Vasopneumatic   15 minutes    Vasopnuematic Location   Knee    Vasopneumatic Pressure  Medium    Vasopneumatic Temperature   34      Manual Therapy   Manual Therapy  Joint mobilization    Joint Mobilization  tibial A>P grade 3-4; tibial P>A mob grade 3-4 with mobilization belt               PT Short Term Goals - 10/27/17 1231      PT SHORT TERM GOAL #1   Title  Patient to be  compliant with appropriate HEP, to be updated PRN     Baseline  "pretty good about it"     Status  Partially Met      PT SHORT TERM GOAL #2   Title  Patient to improve L knee AROM to 0-115 degrees in order to improve gross knee ROM and general biomechanics     Status  On-going      PT SHORT TERM GOAL #3   Title  Patient to demonstrate ability to ambulate without assistive device and consistent heel-toe pattern, equal step lengths/stance times, in order to improve general mobility     Status  On-going      PT SHORT TERM GOAL #4   Title  Patient to report R knee pain as being no more than 2/10 in order to show improvement of general condition     Status  On-going        PT Long Term Goals - 10/21/17 1241      PT LONG TERM GOAL #1   Title  Patient to demonstrate MMT as being 5/5 in all tested muscle groups in order to improve gross knee stability and show improved strength     Time  8    Period  Weeks    Status  New    Target Date  12/16/17      PT LONG TERM GOAL #2   Title  Patient to be able to reciprocally ascend/descend full flight of stairs with U railing, good eccentric control, and minimal unsteadiness in order to improve access to home and community     Time  8    Period  Weeks    Status  New      PT LONG TERM GOAL #3   Title  Patient to be able to complete mock-bowling task with Mod(I) in order to demonstrate strength and balance needed to safely complete this activity independently     Time  8    Period  Weeks    Status  New      PT LONG TERM GOAL #4   Title  Patient to return to regular exercise programming with personal trainer and no increase in pain in order to asisst in return to PLOF based activities     Time  8    Period  Weeks    Status  New            Plan - 11/05/17 1420    Clinical Impression Statement  Treatment focused on stretching, strengthening of hip and knee musculature, joint mobilizations to increase knee flexion and knee extension, and  gait training. Pt demonstrates narrow BOS and decreased arm swing. He required verbal and visual cueing for corrections during gait training. Next session will address gait training on unlevel surfaces.    PT Treatment/Interventions  ADLs/Self Care Home Management;Biofeedback;Cryotherapy;Electrical Stimulation;Iontophoresis 69m/ml Dexamethasone;Moist Heat;Ultrasound;DME Instruction;Stair training;Functional mobility training;Gait training;Therapeutic activities;Therapeutic exercise;Balance training;Neuromuscular re-education;Patient/family education;Manual techniques;Scar mobilization;Passive range of motion;Dry needling;Taping    PT Next Visit Plan   FOTO, initial focus on ROM/edema and pain control, progress to advanced functional strength. Gait training on unlevel surfaces, strengthening using machines, wall squats update HEP PRN    PT Home Exercise Plan  continue with HHPT HEP as it remains appropriate     Consulted and Agree with Plan of Care  Patient       Patient will benefit from skilled therapeutic intervention in order to improve the following deficits and impairments:  Abnormal gait, Increased fascial restricitons, Improper body mechanics, Pain, Decreased coordination, Decreased mobility, Decreased scar mobility, Increased muscle spasms, Postural dysfunction, Decreased activity tolerance, Decreased range of motion, Decreased strength, Hypomobility, Decreased balance, Difficulty walking, Impaired flexibility  Visit Diagnosis: Acute pain of right knee  Stiffness of right knee, not elsewhere classified  Localized edema  Muscle weakness (generalized)  Difficulty in walking, not elsewhere classified     Problem List Patient Active Problem List   Diagnosis Date Noted  . Status post total knee replacement, right 10/07/2017  . Unilateral primary osteoarthritis, right knee 07/14/2017  . History of left knee replacement 07/14/2017  . Trigger finger, right middle finger 06/09/2017  .  Carpal tunnel syndrome, left upper limb 06/09/2017  . Status post total left knee replacement 03/28/2017  . Unilateral primary osteoarthritis, left knee 02/19/2017  . Chronic pain of left knee 02/19/2017  . S/P lumbar laminectomy 12/25/2016  . 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 HTN (hypertension)   . Hypercholesteremia   . Hypothyroidism   . PVC's (premature ventricular contractions)    MWorthy Flank SPT 11/05/17 4:54 PM   [place] [address] [place] [address]Phone: [phone]  Fax:  [phone] Name: WANTOIN DARGISMRN: [id number] of Birth: [birth year]

## 2017-11-06 ENCOUNTER — Encounter: Payer: Self-pay | Admitting: Physical Therapy

## 2017-11-06 ENCOUNTER — Ambulatory Visit: Payer: Medicare Other | Admitting: Physical Therapy

## 2017-11-06 DIAGNOSIS — R262 Difficulty in walking, not elsewhere classified: Secondary | ICD-10-CM | POA: Diagnosis not present

## 2017-11-06 DIAGNOSIS — M25661 Stiffness of right knee, not elsewhere classified: Secondary | ICD-10-CM

## 2017-11-06 DIAGNOSIS — R600 Localized edema: Secondary | ICD-10-CM

## 2017-11-06 DIAGNOSIS — M25561 Pain in right knee: Secondary | ICD-10-CM

## 2017-11-06 DIAGNOSIS — M6281 Muscle weakness (generalized): Secondary | ICD-10-CM

## 2017-11-06 NOTE — Therapy (Addendum)
[place], [place], [address] Phone: [phone]   Fax:  [phone]  Physical Therapy Treatment  Patient Details  Name: Carlos Juarez MRN: [id number] Date of Birth: 20-Feb-1941 Referring Provider: Mcarthur Rossetti    Encounter Date: 11/06/2017  PT End of Session - 11/06/17 1104    Visit Number  8    Number of Visits  20    Date for PT Re-Evaluation  11/18/17    Authorization Type  Medicare/TRICARE (PN visit 10, KX visit 12)    Authorization Time Period  10/21/17 to 12/22/17    PT Start Time  1016    PT Stop Time  1100    PT Time Calculation (min)  44 min    Equipment Utilized During Treatment  Gait belt    Activity Tolerance  Patient tolerated treatment well    Behavior During Therapy  [facility] for tasks assessed/performed       Past Medical History:  Diagnosis Date  . Arthritis   . Cancer ([place])    skin cancer removed rom nose non melanoma  . HTN (hypertension)   . Hypercholesteremia   . Hypothyroidism   . Pneumonia   . PVC's (premature ventricular contractions)   . Synovial cyst   . Trigger middle finger    right    Past Surgical History:  Procedure Laterality Date  . CARPAL TUNNEL RELEASE    . HERNIA REPAIR     as a child r inguinal  . JOINT REPLACEMENT     T[facility] Dr. [name] 03/28/17  . LUMBAR LAMINECTOMY/DECOMPRESSION MICRODISCECTOMY N/A 12/25/2016   Procedure: Laminectomy and Foraminotomy - Lumbar two-Lumbar three - Lumbar three-Lumbar four, resection of synovial cyst;  Surgeon: Eustace Moore, MD;  Location: [place];  Service: Neurosurgery;  Laterality: N/A;  . MENISCUS REPAIR Left   . STERIOD INJECTION Right 10/07/2017   Procedure: RIGHT MIDDLE TRIGGER FINGER INJECTION;  Surgeon: Mcarthur Rossetti, MD;  Location: [place];  Service: Orthopedics;  Laterality: Right;  . synovial cyst removed     12/25/16  . TONSILLECTOMY    . TOTAL KNEE ARTHROPLASTY Left 03/28/2017   Procedure: LEFT  TOTAL KNEE ARTHROPLASTY;  Surgeon: Mcarthur Rossetti, MD;  [facility];  Service: Orthopedics;  Laterality: Left;  . TOTAL KNEE ARTHROPLASTY Right 10/07/2017   Procedure: RIGHT TOTAL KNEE ARTHROPLASTY;  Surgeon: Mcarthur Rossetti, MD;  Location: [place];  Service: Orthopedics;  Laterality: Right;    There were no vitals filed for this visit.  Subjective Assessment - 11/06/17 1020    Subjective  "I feel wonderful. I felt okay after yesterdays session."    Currently in Pain?  Yes    Pain Score  3     Pain Location  Knee    Pain Orientation  Right    Pain Descriptors / Indicators  Aching    Pain Type  Surgical pain    Pain Radiating Towards  none    Pain Onset  1 to 4 weeks ago    Pain Frequency  Constant    Aggravating Factors   sleeping, being in one position for a long time     Pain Relieving Factors  ice, change position    Effect of Pain on Daily Activities  sleeping affected         [facility] PT Assessment - 11/06/17 0001      Observation/Other Assessments   Focus on Therapeutic Outcomes (FOTO)   41% limited  AROM   Right Knee Extension  9 6 degrees following mobilizations    Right Knee Flexion  110                   [facility] Adult PT Treatment/Exercise - 11/06/17 0001      Ambulation/Gait   Gait Comments  gait training focusing on unlevel surfaces, inclines and declines      Knee/Hip Exercises: Stretches   Sports administrator  60 seconds;1 rep;Right PNF contract relax; inc knee flexion inbetween each set      Knee/Hip Exercises: Aerobic   Stationary Bike  L1 x 4 min    Elliptical  L3 x 4 min      Knee/Hip Exercises: Standing   Wall Squat  10 reps;2 sets      Knee/Hip Exercises: Sidelying   Hip ABduction  AROM;Strengthening;Right;2 sets;15 reps      Manual Therapy   Manual Therapy  Joint mobilization    Joint Mobilization  tibial A>P grade 3-4; tibial P>A mob grade 3-4 with mobilization belt             PT Education - 11/06/17 1103     Education Details  knee ROM, FOTO, gait training on unlevel surfaces    Person(s) Educated  Patient    Methods  Explanation    Comprehension  Verbalized understanding       PT Short Term Goals - 10/27/17 1231      PT SHORT TERM GOAL #1   Title  Patient to be compliant with appropriate HEP, to be updated PRN     Baseline  "pretty good about it"     Status  Partially Met      PT SHORT TERM GOAL #2   Title  Patient to improve L knee AROM to 0-115 degrees in order to improve gross knee ROM and general biomechanics     Status  On-going      PT SHORT TERM GOAL #3   Title  Patient to demonstrate ability to ambulate without assistive device and consistent heel-toe pattern, equal step lengths/stance times, in order to improve general mobility     Status  On-going      PT SHORT TERM GOAL #4   Title  Patient to report R knee pain as being no more than 2/10 in order to show improvement of general condition     Status  On-going        PT Long Term Goals - 10/21/17 1241      PT LONG TERM GOAL #1   Title  Patient to demonstrate MMT as being 5/5 in all tested muscle groups in order to improve gross knee stability and show improved strength     Time  8    Period  Weeks    Status  New    Target Date  12/16/17      PT LONG TERM GOAL #2   Title  Patient to be able to reciprocally ascend/descend full flight of stairs with U railing, good eccentric control, and minimal unsteadiness in order to improve access to home and community     Time  8    Period  Weeks    Status  New      PT LONG TERM GOAL #3   Title  Patient to be able to complete mock-bowling task with Mod(I) in order to demonstrate strength and balance needed to safely complete this activity independently     Time  8  Period  Weeks    Status  New      PT LONG TERM GOAL #4   Title  Patient to return to regular exercise programming with personal trainer and no increase in pain in order to asisst in return to PLOF based  activities     Time  8    Period  Weeks    Status  New            Plan - 11/06/17 1105    Clinical Impression Statement  Pt reports he is a little more sore this morning. Treatment focused on endurance, knee joint mobilizations to increase flexion and extension, and strengthening of knee and hip. Pt demonstrates increase in knee flexion and extension AROM. Practiced gait training on unlevel surfaces outside including grass, inclines and declines. Pt tolerated this well. Also practiced balance in tandem stance and with head turns.     PT Treatment/Interventions  ADLs/Self Care Home Management;Biofeedback;Cryotherapy;Electrical Stimulation;Iontophoresis 45m/ml Dexamethasone;Moist Heat;Ultrasound;DME Instruction;Stair training;Functional mobility training;Gait training;Therapeutic activities;Therapeutic exercise;Balance training;Neuromuscular re-education;Patient/family education;Manual techniques;Scar mobilization;Passive range of motion;Dry needling;Taping    PT Next Visit Plan   FOTO, initial focus on ROM/edema and pain control, progress to advanced functional strength. Gait training on unlevel surfaces, strengthening using machines, wall squats update HEP PRN, balance with head turns    PT Home Exercise Plan  continue with HHPT HEP as it remains appropriate     Consulted and Agree with Plan of Care  Patient       Patient will benefit from skilled therapeutic intervention in order to improve the following deficits and impairments:  Abnormal gait, Increased fascial restricitons, Improper body mechanics, Pain, Decreased coordination, Decreased mobility, Decreased scar mobility, Increased muscle spasms, Postural dysfunction, Decreased activity tolerance, Decreased range of motion, Decreased strength, Hypomobility, Decreased balance, Difficulty walking, Impaired flexibility  Visit Diagnosis: Acute pain of right knee  Stiffness of right knee, not elsewhere classified  Localized edema  Muscle  weakness (generalized)  Difficulty in walking, not elsewhere classified     Problem List Patient Active Problem List   Diagnosis Date Noted  . Status post total knee replacement, right 10/07/2017  . Unilateral primary osteoarthritis, right knee 07/14/2017  . History of left knee replacement 07/14/2017  . Trigger finger, right middle finger 06/09/2017  . Carpal tunnel syndrome, left upper limb 06/09/2017  . Status post total left knee replacement 03/28/2017  . Unilateral primary osteoarthritis, left knee 02/19/2017  . Chronic pain of left knee 02/19/2017  . S/P lumbar laminectomy 12/25/2016  . 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 HTN (hypertension)   . Hypercholesteremia   . Hypothyroidism   . PVC's (premature ventricular contractions)     MWorthy Flank SPT 11/06/17 11:28 AM   [facility]ucl[address] AvenueGCollings Lakes N[place]aska [address]Phone: [phone]  Fax:  [phone] Name: Carlos GURRYMRN: [id number] of Birth: [birth year]

## 2017-11-10 ENCOUNTER — Ambulatory Visit: Payer: Medicare Other | Admitting: Physical Therapy

## 2017-11-10 ENCOUNTER — Encounter: Payer: Self-pay | Admitting: Physical Therapy

## 2017-11-10 DIAGNOSIS — R262 Difficulty in walking, not elsewhere classified: Secondary | ICD-10-CM | POA: Diagnosis not present

## 2017-11-10 DIAGNOSIS — M25561 Pain in right knee: Secondary | ICD-10-CM | POA: Diagnosis not present

## 2017-11-10 DIAGNOSIS — M25661 Stiffness of right knee, not elsewhere classified: Secondary | ICD-10-CM | POA: Diagnosis not present

## 2017-11-10 DIAGNOSIS — M6281 Muscle weakness (generalized): Secondary | ICD-10-CM

## 2017-11-10 DIAGNOSIS — R600 Localized edema: Secondary | ICD-10-CM

## 2017-11-10 NOTE — Therapy (Signed)
[place], [place], [address] Phone: [phone]   Fax:  [phone]  Physical Therapy Treatment  Patient Details  Name: Carlos Juarez MRN: [id number] Date of Birth: 1940/11/01 Referring Provider: Mcarthur Rossetti    Encounter Date: 11/10/2017  PT End of Session - 11/10/17 1513    Visit Number  9    Number of Visits  20    Date for PT Re-Evaluation  11/18/17    Authorization Type  Medicare/TRICARE (PN visit 10, KX visit 69)    Authorization Time Period  10/21/17 to 12/22/17    PT Start Time  1417    PT Stop Time  1501    PT Time Calculation (min)  44 min    Equipment Utilized During Treatment  Gait belt    Activity Tolerance  Patient tolerated treatment well    Behavior During Therapy  [facility] for tasks assessed/performed       Past Medical History:  Diagnosis Date  . Arthritis   . Cancer ([place])    skin cancer removed rom nose non melanoma  . HTN (hypertension)   . Hypercholesteremia   . Hypothyroidism   . Pneumonia   . PVC's (premature ventricular contractions)   . Synovial cyst   . Trigger middle finger    right    Past Surgical History:  Procedure Laterality Date  . CARPAL TUNNEL RELEASE    . HERNIA REPAIR     as a child r inguinal  . JOINT REPLACEMENT     TKA Dr. [name] 03/28/17  . LUMBAR LAMINECTOMY/DECOMPRESSION MICRODISCECTOMY N/A 12/25/2016   Procedure: Laminectomy and Foraminotomy - Lumbar two-Lumbar three - Lumbar three-Lumbar four, resection of synovial cyst;  Surgeon: Eustace Moore, MD;  Location: [place];  Service: Neurosurgery;  Laterality: N/A;  . MENISCUS REPAIR Left   . STERIOD INJECTION Right 10/07/2017   Procedure: RIGHT MIDDLE TRIGGER FINGER INJECTION;  Surgeon: Mcarthur Rossetti, MD;  Location: [place];  Service: Orthopedics;  Laterality: Right;  . synovial cyst removed     12/25/16  . TONSILLECTOMY    . TOTAL KNEE ARTHROPLASTY Left 03/28/2017   Procedure: LEFT  TOTAL KNEE ARTHROPLASTY;  Surgeon: Mcarthur Rossetti, MD;  Location: [facility];  Service: Orthopedics;  Laterality: Left;  . TOTAL KNEE ARTHROPLASTY Right 10/07/2017   Procedure: RIGHT TOTAL KNEE ARTHROPLASTY;  Surgeon: Mcarthur Rossetti, MD;  Location: [facility] [facility];  Service: Orthopedics;  Laterality: Right;    There were no vitals filed for this visit.  Subjective Assessment - 11/10/17 1423    Subjective  "I feel the best I have felt all day."    Currently in Pain?  Yes    Pain Score  2     Pain Location  Knee    Pain Orientation  Right    Pain Descriptors / Indicators  Aching    Pain Type  Surgical pain    Pain Onset  1 to 4 weeks ago    Pain Frequency  Constant                       [facility] Adult PT Treatment/Exercise - 11/10/17 0001      Knee/Hip Exercises: Aerobic   Stationary Bike  L4 x 4 min inc knee flexion intermittently by moving seat forward    Elliptical  L5 x 4 min (L3 incline)      Knee/Hip Exercises: Machines for Strengthening   Cybex Leg Press  2 x 15 40#      Knee/Hip Exercises: Standing   Wall Squat  10 reps;2 sets    Other Standing Knee Exercises  lateral band walks (red) down and back x 5       Manual Therapy   Manual Therapy  Soft tissue mobilization    Soft tissue mobilization  scar mobilization R knee          Balance Exercises - 11/10/17 1515      Balance Exercises: Standing   Tandem Stance  Eyes open;Eyes closed;Other reps (comment);Limitations 6 reps; with eyes closed, vertical and horizontal head turns    Overall Comments  -- pt struggles with tandem stance with horizontal head turns          PT Short Term Goals - 10/27/17 1231      PT SHORT TERM GOAL #1   Title  Patient to be compliant with appropriate HEP, to be updated PRN     Baseline  "pretty good about it"     Status  Partially Met      PT SHORT TERM GOAL #2   Title  Patient to improve L knee AROM to 0-115 degrees in order to improve gross knee ROM and  general biomechanics     Status  On-going      PT SHORT TERM GOAL #3   Title  Patient to demonstrate ability to ambulate without assistive device and consistent heel-toe pattern, equal step lengths/stance times, in order to improve general mobility     Status  On-going      PT SHORT TERM GOAL #4   Title  Patient to report R knee pain as being no more than 2/10 in order to show improvement of general condition     Status  On-going        PT Long Term Goals - 10/21/17 1241      PT LONG TERM GOAL #1   Title  Patient to demonstrate MMT as being 5/5 in all tested muscle groups in order to improve gross knee stability and show improved strength     Time  8    Period  Weeks    Status  New    Target Date  12/16/17      PT LONG TERM GOAL #2   Title  Patient to be able to reciprocally ascend/descend full flight of stairs with U railing, good eccentric control, and minimal unsteadiness in order to improve access to home and community     Time  8    Period  Weeks    Status  New      PT LONG TERM GOAL #3   Title  Patient to be able to complete mock-bowling task with Mod(I) in order to demonstrate strength and balance needed to safely complete this activity independently     Time  8    Period  Weeks    Status  New      PT LONG TERM GOAL #4   Title  Patient to return to regular exercise programming with personal trainer and no increase in pain in order to asisst in return to PLOF based activities     Time  8    Period  Weeks    Status  New            Plan - 11/10/17 1518    Clinical Impression Statement  Treatment today focused on scar mobilization, balance, and strengthening in standing. Pt showing good progress in regards  to R knee AROM and strength. He struggles when he is in tandem stance and performs horizontal head turns. Will continue working on balance in future sessions.      PT Treatment/Interventions  ADLs/Self Care Home Management;Biofeedback;Cryotherapy;Electrical  Stimulation;Iontophoresis 69m/ml Dexamethasone;Moist Heat;Ultrasound;DME Instruction;Stair training;Functional mobility training;Gait training;Therapeutic activities;Therapeutic exercise;Balance training;Neuromuscular re-education;Patient/family education;Manual techniques;Scar mobilization;Passive range of motion;Dry needling;Taping    PT Next Visit Plan  RESISTED WALKING, initial focus on ROM/edema and pain control, progress to advanced functional strength. Gait training on unlevel surfaces, strengthening using machines, wall squats update HEP PRN, balance with head turns    PT Home Exercise Plan  continue with HHPT HEP as it remains appropriate     Consulted and Agree with Plan of Care  Patient       Patient will benefit from skilled therapeutic intervention in order to improve the following deficits and impairments:  Abnormal gait, Increased fascial restricitons, Improper body mechanics, Pain, Decreased coordination, Decreased mobility, Decreased scar mobility, Increased muscle spasms, Postural dysfunction, Decreased activity tolerance, Decreased range of motion, Decreased strength, Hypomobility, Decreased balance, Difficulty walking, Impaired flexibility  Visit Diagnosis: Acute pain of right knee  Stiffness of right knee, not elsewhere classified  Localized edema  Muscle weakness (generalized)  Difficulty in walking, not elsewhere classified     Problem List Patient Active Problem List   Diagnosis Date Noted  . Status post total knee replacement, right 10/07/2017  . Unilateral primary osteoarthritis, right knee 07/14/2017  . History of left knee replacement 07/14/2017  . Trigger finger, right middle finger 06/09/2017  . Carpal tunnel syndrome, left upper limb 06/09/2017  . Status post total left knee replacement 03/28/2017  . Unilateral primary osteoarthritis, left knee 02/19/2017  . Chronic pain of left knee 02/19/2017  . S/Juarez lumbar laminectomy 12/25/2016  . Cervical  spondylosis 08/20/2011  . HTN (hypertension)   . Hypercholesteremia   . Hypothyroidism   . PVC's (premature ventricular contractions)    MWorthy Flank SPT 11/10/17 3:24 PM   [facility] Center[address]GPerrys[place]Al[place] [address]Phone: [phone]  Fax:  [phone] Name: Carlos STAUPMRN: [id number] of Birth: 307-Mar-1942

## 2017-11-12 ENCOUNTER — Ambulatory Visit: Payer: Medicare Other | Admitting: Physical Therapy

## 2017-11-12 ENCOUNTER — Encounter: Payer: Self-pay | Admitting: Physical Therapy

## 2017-11-12 ENCOUNTER — Other Ambulatory Visit: Payer: Self-pay | Admitting: Cardiovascular Disease

## 2017-11-12 DIAGNOSIS — M25661 Stiffness of right knee, not elsewhere classified: Secondary | ICD-10-CM | POA: Diagnosis not present

## 2017-11-12 DIAGNOSIS — M25561 Pain in right knee: Secondary | ICD-10-CM | POA: Diagnosis not present

## 2017-11-12 DIAGNOSIS — R262 Difficulty in walking, not elsewhere classified: Secondary | ICD-10-CM | POA: Diagnosis not present

## 2017-11-12 DIAGNOSIS — R600 Localized edema: Secondary | ICD-10-CM

## 2017-11-12 DIAGNOSIS — M6281 Muscle weakness (generalized): Secondary | ICD-10-CM | POA: Diagnosis not present

## 2017-11-12 NOTE — Therapy (Addendum)
[place], [place], [address] Phone: [phone]   Fax:  [phone]  Physical Therapy Treatment Progress Note Reporting Period 10/21/2017 to 11/12/2017  See note below for Objective Data and Assessment of Progress/Goals.      Patient Details  Name: Carlos Juarez MRN: [id number] Date of Birth: 07-05-1940 Referring Provider: Mcarthur Rossetti    Encounter Date: 11/12/2017  PT End of Session - 11/12/17 1717    Visit Number  10    Number of Visits  20    Date for PT Re-Evaluation  11/18/17    Authorization Type  Medicare/TRICARE (PN visit 52, KX visit 93)    Authorization Time Period  10/21/17 to 12/22/17    PT Start Time  1330    PT Stop Time  1415    PT Time Calculation (min)  45 min    Equipment Utilized During Treatment  Gait belt    Activity Tolerance  Patient tolerated treatment well    Behavior During Therapy  WFL for tasks assessed/performed       Past Medical History:  Diagnosis Date  . Arthritis   . Cancer ([place])    skin cancer removed rom nose non melanoma  . HTN (hypertension)   . Hypercholesteremia   . Hypothyroidism   . Pneumonia   . PVC's (premature ventricular contractions)   . Synovial cyst   . Trigger middle finger    right    Past Surgical History:  Procedure Laterality Date  . CARPAL TUNNEL RELEASE    . HERNIA REPAIR     as a child r inguinal  . JOINT REPLACEMENT     TKA Dr. [name] 03/28/17  . LUMBAR LAMINECTOMY/DECOMPRESSION MICRODISCECTOMY N/A 12/25/2016   Procedure: Laminectomy and Foraminotomy - Lumbar two-Lumbar three - Lumbar three-Lumbar four, resection of synovial cyst;  Surgeon: Eustace Moore, MD;  Location: [place];  Service: Neurosurgery;  Laterality: N/A;  . MENISCUS REPAIR Left   . STERIOD INJECTION Right 10/07/2017   Procedure: RIGHT MIDDLE TRIGGER FINGER INJECTION;  Surgeon: Mcarthur Rossetti, MD;  Location: [place];  Service: Orthopedics;   Laterality: Right;  . synovial cyst removed     12/25/16  . TONSILLECTOMY    . TOTAL KNEE ARTHROPLASTY Left 03/28/2017   Procedure: LEFT TOTAL KNEE ARTHROPLASTY;  Surgeon: Mcarthur Rossetti, MD;  Location[facility];  Service: Orthopedics;  Laterality: Left;  . TOTAL KNEE ARTHROPLASTY Right 10/07/2017   Procedure: RIGHT TOTAL KNEE ARTHROPLASTY;  Surgeon: Mcarthur Rossetti, MD;  Location: [facility];  Service: Orthopedics;  Laterality: Right;    There were no vitals filed for this visit.      [facility] PT Assessment - 11/12/17 0001      AROM   Right Knee Extension  4    Right Knee Flexion  114      Strength   Right Hip Flexion  4+/5    Right Hip Extension  3+/5    Right Hip ABduction  4/5    Left Hip Flexion  5/5    Left Hip Extension  4/5    Left Hip ABduction  4+/5    Right Knee Flexion  4+/5    Right Knee Extension  4+/5    Left Knee Flexion  4-/5    Left Knee Extension  4+/5    Right Ankle Dorsiflexion  4+/5  [facility] Adult PT Treatment/Exercise - 11/12/17 0001      Ambulation/Gait   Gait Comments  resisted walking forwards and backwards x 8; 7 lbs initially increasing to 10 lbs cues to control motion and increase BOS      Knee/Hip Exercises: Aerobic   Stationary Bike  L3 x 4 min (incline 3) moved bike forward each min to inc knee flex    Elliptical  L3 x 4 min      Knee/Hip Exercises: Machines for Strengthening   Cybex Knee Extension  2x 10 15 lbs single leg    Cybex Knee Flexion  2 x 10 20 lbs; double leg concentric, single leg eccentric      Knee/Hip Exercises: Standing   Other Standing Knee Exercises  mini squats at parallel bars for UE support 2 x 10          Balance Exercises - 11/12/17 1710      Balance Exercises: Standing   Tandem Stance  Eyes open;Eyes closed;Other reps (comment);Limitations 6 reps; with eyes closed, vertical and horizontal head turns    Tandem Gait  Forward;2 reps approximately 30 feet x 2           PT Short Term Goals - 10/27/17 1231      PT SHORT TERM GOAL #1   Title  Patient to be compliant with appropriate HEP, to be updated PRN     Baseline  "pretty good about it"     Status  Partially Met      PT SHORT TERM GOAL #2   Title  Patient to improve L knee AROM to 0-115 degrees in order to improve gross knee ROM and general biomechanics     Status  On-going      PT SHORT TERM GOAL #3   Title  Patient to demonstrate ability to ambulate without assistive device and consistent heel-toe pattern, equal step lengths/stance times, in order to improve general mobility     Status  On-going      PT SHORT TERM GOAL #4   Title  Patient to report R knee pain as being no more than 2/10 in order to show improvement of general condition     Status  On-going        PT Long Term Goals - 11/12/17 1349      PT LONG TERM GOAL #1   Title  Patient to demonstrate MMT as being 5/5 in all tested muscle groups in order to improve gross knee stability and show improved strength     Status  On-going      PT LONG TERM GOAL #2   Title  Patient to be able to reciprocally ascend/descend full flight of stairs with U railing, good eccentric control, and minimal unsteadiness in order to improve access to home and community     Status  Achieved      PT LONG TERM GOAL #3   Title  Patient to be able to complete mock-bowling task with Mod(I) in order to demonstrate strength and balance needed to safely complete this activity independently     Status  On-going      PT LONG TERM GOAL #4   Title  Patient to return to regular exercise programming with personal trainer and no increase in pain in order to asisst in return to PLOF based activities     Status  On-going            Plan - 11/12/17 1717    Clinical  Impression Statement  Reassessment performed today; pt showing increases in knee and hip strength and increased R knee AROM. He has almost achieved full extension (lacking 4 degrees) and has  achieved 114 degrees of knee flexion. He has working towards achievement of LTGs. Treatment focused on strengthening of knee and hip musculature and balance. Pt would benefit from further physical therapy services to address pain, ROM, strength, and functional mobility.    Rehab Potential  Excellent    PT Frequency  Other (comment) 3x/wk for 4 weeks; 2x/wk for next 4 wks    PT Duration  8 weeks    PT Treatment/Interventions  ADLs/Self Care Home Management;Biofeedback;Cryotherapy;Electrical Stimulation;Iontophoresis 55m/ml Dexamethasone;Moist Heat;Ultrasound;DME Instruction;Stair training;Functional mobility training;Gait training;Therapeutic activities;Therapeutic exercise;Balance training;Neuromuscular re-education;Patient/family education;Manual techniques;Scar mobilization;Passive range of motion;Dry needling;Taping    PT Next Visit Plan  RESISTED WALKING, initial focus on ROM/edema and pain control, progress to advanced functional strength. Gait training on unlevel surfaces, strengthening using machines, wall squats update HEP PRN, balance with head turns, mini squats at parallel bars    PT Home Exercise Plan  continue with HHPT HEP as it remains appropriate     Consulted and Agree with Plan of Care  Patient       Patient will benefit from skilled therapeutic intervention in order to improve the following deficits and impairments:  Abnormal gait, Increased fascial restricitons, Improper body mechanics, Pain, Decreased coordination, Decreased mobility, Decreased scar mobility, Increased muscle spasms, Postural dysfunction, Decreased activity tolerance, Decreased range of motion, Decreased strength, Hypomobility, Decreased balance, Difficulty walking, Impaired flexibility  Visit Diagnosis: Acute pain of right knee  Stiffness of right knee, not elsewhere classified  Localized edema  Muscle weakness (generalized)  Difficulty in walking, not elsewhere classified     Problem List Patient  Active Problem List   Diagnosis Date Noted  . Status post total knee replacement, right 10/07/2017  . Unilateral primary osteoarthritis, right knee 07/14/2017  . History of left knee replacement 07/14/2017  . Trigger finger, right middle finger 06/09/2017  . Carpal tunnel syndrome, left upper limb 06/09/2017  . Status post total left knee replacement 03/28/2017  . Unilateral primary osteoarthritis, left knee 02/19/2017  . Chronic pain of left knee 02/19/2017  . S/P lumbar laminectomy 12/25/2016  . 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 HTN (hypertension)   . Hypercholesteremia   . Hypothyroidism   . PVC's (premature ventricular contractions)    Carlos Juarez SPT 11/12/17 5:50 PM   [facility] [address]Phone: [phone]  Fax:  [phone] Name: WWINSON EICHORNMRN: [id number] of Birth: [birth year]

## 2017-11-13 ENCOUNTER — Encounter: Payer: Self-pay | Admitting: Physical Therapy

## 2017-11-13 ENCOUNTER — Ambulatory Visit: Payer: Medicare Other | Attending: Orthopaedic Surgery | Admitting: Physical Therapy

## 2017-11-13 DIAGNOSIS — R262 Difficulty in walking, not elsewhere classified: Secondary | ICD-10-CM | POA: Diagnosis not present

## 2017-11-13 DIAGNOSIS — R600 Localized edema: Secondary | ICD-10-CM

## 2017-11-13 DIAGNOSIS — M25561 Pain in right knee: Secondary | ICD-10-CM | POA: Diagnosis not present

## 2017-11-13 DIAGNOSIS — M25661 Stiffness of right knee, not elsewhere classified: Secondary | ICD-10-CM | POA: Diagnosis not present

## 2017-11-13 DIAGNOSIS — M6281 Muscle weakness (generalized): Secondary | ICD-10-CM

## 2017-11-13 NOTE — Therapy (Addendum)
[place], [place], [address] Phone: [phone]   Fax:  [phone]  Physical Therapy Treatment  Patient Details  Name: Carlos Juarez MRN: [id number] Date of Birth: [birth year] Referring Provider: Mcarthur Rossetti    Encounter Date: 11/13/2017  PT End of Session - 11/13/17 1058    Visit Number  11    Number of Visits  20    Date for PT Re-Evaluation  11/18/17    Authorization Type  Medicare/TRICARE (PN visit 17,  KX visit 10)    PT Start Time  1010    PT Stop Time  1056    PT Time Calculation (min)  46 min    Equipment Utilized During Treatment  Gait belt    Activity Tolerance  Patient tolerated treatment well    Behavior During Therapy  WFL for tasks assessed/performed        Past Medical History:  Diagnosis Date  . Arthritis   . Cancer ([place])    skin cancer removed rom nose non melanoma  . HTN (hypertension)   . Hypercholesteremia   . Hypothyroidism   . Pneumonia   . PVC's (premature ventricular contractions)   . Synovial cyst   . Trigger middle finger    right    Past Surgical History:  Procedure Laterality Date  . CARPAL TUNNEL RELEASE    . HERNIA REPAIR     as a child r inguinal  . JOINT REPLACEMENT     TKA Dr. [name] 03/28/17  . LUMBAR LAMINECTOMY/DECOMPRESSION MICRODISCECTOMY N/A 12/25/2016   Procedure: Laminectomy and Foraminotomy - Lumbar two-Lumbar three - Lumbar three-Lumbar four, resection of synovial cyst;  Surgeon: Eustace Moore, MD;  Location: [facility];  Service: Neurosurgery;  Laterality: N/A;  . MENISCUS REPAIR Left   . STERIOD INJECTION Right 10/07/2017   Procedure: RIGHT MIDDLE TRIGGER FINGER INJECTION;  Surgeon: Mcarthur Rossetti, MD;  Location: [facility];  Service: Orthopedics;  Laterality: Right;  . synovial cyst removed     12/25/16  . TONSILLECTOMY    . TOTAL KNEE ARTHROPLASTY Left 03/28/2017   Procedure: LEFT TOTAL KNEE ARTHROPLASTY;  Surgeon: Mcarthur Rossetti, MD;  Location: [facility];  Service: Orthopedics;  Laterality: Left;  . TOTAL KNEE ARTHROPLASTY Right 10/07/2017   Procedure: RIGHT TOTAL KNEE ARTHROPLASTY;  Surgeon: Mcarthur Rossetti, MD;  Location: [facility];  Service: Orthopedics;  Laterality: Right;    There were no vitals filed for this visit.  Subjective Assessment - 11/13/17 1009    Subjective  "I feel wonderful today. I have been doing good."    Currently in Pain?  Yes    Pain Score  2     Pain Location  Knee    Pain Orientation  Right    Pain Descriptors / Indicators  Aching    Pain Type  Surgical pain    Pain Onset  1 to 4 weeks ago    Pain Frequency  Constant    Aggravating Factors   being in one position for too long    Pain Relieving Factors  ice, changing position         [facility] PT Assessment - 11/13/17 0001      AROM   Right Knee Extension  6 following mobilization    Right Knee Flexion  117 following mobilization                   [facility] Adult PT Treatment/Exercise - 11/13/17 0001  Knee/Hip Exercises: Stretches   Passive Hamstring Stretch  2 reps;Right;30 seconds      Knee/Hip Exercises: Aerobic   Stationary Bike  L3 x 5 min      Knee/Hip Exercises: Standing   Forward Lunges  2 sets;10 reps in place; bosu ball used for tactile cueing    Other Standing Knee Exercises  modified bowling with verbal cues for increasing step count with approach, kicking back leg out x 6 reps with blue weighted ball      Manual Therapy   Manual Therapy  Passive ROM    Joint Mobilization  tibial A>P grade 3-4 with knee flexed; tibial P>A mob grade 4 with mobilization belt; A>P mob with active quad set    Passive ROM  forced R knee flexion with therapists arm in pt's popliteal space applying force at pt's distal tibia              PT Short Term Goals - 10/27/17 1231      PT SHORT TERM GOAL #1   Title  Patient to be compliant with appropriate HEP, to be updated PRN     Baseline  "pretty  good about it"     Status  Partially Met      PT SHORT TERM GOAL #2   Title  Patient to improve L knee AROM to 0-115 degrees in order to improve gross knee ROM and general biomechanics     Status  On-going      PT SHORT TERM GOAL #3   Title  Patient to demonstrate ability to ambulate without assistive device and consistent heel-toe pattern, equal step lengths/stance times, in order to improve general mobility     Status  On-going      PT SHORT TERM GOAL #4   Title  Patient to report R knee pain as being no more than 2/10 in order to show improvement of general condition     Status  On-going        PT Long Term Goals - 11/12/17 1349      PT LONG TERM GOAL #1   Title  Patient to demonstrate MMT as being 5/5 in all tested muscle groups in order to improve gross knee stability and show improved strength     Status  On-going      PT LONG TERM GOAL #2   Title  Patient to be able to reciprocally ascend/descend full flight of stairs with U railing, good eccentric control, and minimal unsteadiness in order to improve access to home and community     Status  Achieved      PT LONG TERM GOAL #3   Title  Patient to be able to complete mock-bowling task with Mod(I) in order to demonstrate strength and balance needed to safely complete this activity independently     Status  On-going      PT LONG TERM GOAL #4   Title  Patient to return to regular exercise programming with personal trainer and no increase in pain in order to asisst in return to PLOF based activities     Status  On-going            Plan - 11/13/17 1153    Clinical Impression Statement  Treatment focused on increasing R knee ROM today. Pt still lacking 6 degrees of extension and is now at 117 degrees of knee flexion. Lunges incorporated into treatment today and modified bowling. Pt reponded well but needs continued practice.    PT  Treatment/Interventions  ADLs/Self Care Home Management;Biofeedback;Cryotherapy;Electrical  Stimulation;Iontophoresis 77m/ml Dexamethasone;Moist Heat;Ultrasound;DME Instruction;Stair training;Functional mobility training;Gait training;Therapeutic activities;Therapeutic exercise;Balance training;Neuromuscular re-education;Patient/family education;Manual techniques;Scar mobilization;Passive range of motion;Dry needling;Taping    PT Next Visit Plan  RESISTED WALKING, initial focus on ROM/edema and pain control, progress to advanced functional strength. Gait training on unlevel surfaces, strengthening using machines, wall squats update HEP PRN, balance with head turns, mini squats at parallel bars, modified bowling, lunges    PT Home Exercise Plan  continue with HHPT HEP as it remains appropriate     Consulted and Agree with Plan of Care  Patient       Patient will benefit from skilled therapeutic intervention in order to improve the following deficits and impairments:  Abnormal gait, Increased fascial restricitons, Improper body mechanics, Pain, Decreased coordination, Decreased mobility, Decreased scar mobility, Increased muscle spasms, Postural dysfunction, Decreased activity tolerance, Decreased range of motion, Decreased strength, Hypomobility, Decreased balance, Difficulty walking, Impaired flexibility  Visit Diagnosis: Acute pain of right knee  Stiffness of right knee, not elsewhere classified  Localized edema  Muscle weakness (generalized)  Difficulty in walking, not elsewhere classified     Problem List Patient Active Problem List   Diagnosis Date Noted  . Status post total knee replacement, right 10/07/2017  . Unilateral primary osteoarthritis, right knee 07/14/2017  . History of left knee replacement 07/14/2017  . Trigger finger, right middle finger 06/09/2017  . Carpal tunnel syndrome, left upper limb 06/09/2017  . Status post total left knee replacement 03/28/2017  . Unilateral primary osteoarthritis, left knee 02/19/2017  . Chronic pain of left knee 02/19/2017  .  S/P lumbar laminectomy 12/25/2016  . 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 HTN (hypertension)   . Hypercholesteremia   . Hypothyroidism   . PVC's (premature ventricular contractions)    MWorthy Flank SPT 11/13/17 12:28 PM   [facility] [address]Phone: [phone]  Fax:  [phone] Name: WGEAN LAROSEMRN: [id number] of Birth: 312/28/1942

## 2017-11-17 ENCOUNTER — Ambulatory Visit: Payer: Medicare Other | Admitting: Physical Therapy

## 2017-11-17 ENCOUNTER — Encounter: Payer: Self-pay | Admitting: Physical Therapy

## 2017-11-17 DIAGNOSIS — M25561 Pain in right knee: Secondary | ICD-10-CM

## 2017-11-17 DIAGNOSIS — R262 Difficulty in walking, not elsewhere classified: Secondary | ICD-10-CM | POA: Diagnosis not present

## 2017-11-17 DIAGNOSIS — M25661 Stiffness of right knee, not elsewhere classified: Secondary | ICD-10-CM

## 2017-11-17 DIAGNOSIS — R600 Localized edema: Secondary | ICD-10-CM

## 2017-11-17 DIAGNOSIS — M6281 Muscle weakness (generalized): Secondary | ICD-10-CM | POA: Diagnosis not present

## 2017-11-17 NOTE — Therapy (Signed)
[place], [place], [address] Phone: [phone]   Fax:  [phone]  Physical Therapy Treatment/Re-certification  Patient Details  Name: Carlos Juarez MRN: [id number] Date of Birth: [birth year] Referring Provider: Mcarthur Rossetti    Encounter Date: 11/17/2017  PT End of Session - 11/17/17 1022    Visit Number  12    Number of Visits  20    Date for PT Re-Evaluation  12/15/17    Authorization Type  Medicare/TRICARE (PN visit 76,  KX visit 15)    Authorization Time Period  10/21/17 to 12/22/17    PT Start Time  0930    PT Stop Time  1028    PT Time Calculation (min)  58 min    Equipment Utilized During Treatment  Gait belt    Activity Tolerance  Patient tolerated treatment well    Behavior During Therapy  [facility] for tasks assessed/performed       Past Medical History:  Diagnosis Date  . Arthritis   . Cancer ([place])    skin cancer removed rom nose non melanoma  . HTN (hypertension)   . Hypercholesteremia   . Hypothyroidism   . Pneumonia   . PVC's (premature ventricular contractions)   . Synovial cyst   . Trigger middle finger    right    Past Surgical History:  Procedure Laterality Date  . CARPAL TUNNEL RELEASE    . HERNIA REPAIR     as a child r inguinal  . JOINT REPLACEMENT     TKA Dr. [name] 03/28/17  . LUMBAR LAMINECTOMY/DECOMPRESSION MICRODISCECTOMY N/A 12/25/2016   Procedure: Laminectomy and Foraminotomy - Lumbar two-Lumbar three - Lumbar three-Lumbar four, resection of synovial cyst;  Surgeon: Eustace Moore, MD;  Location: [place];  Service: Neurosurgery;  Laterality: N/A;  . MENISCUS REPAIR Left   . STERIOD INJECTION Right 10/07/2017   Procedure: RIGHT MIDDLE TRIGGER FINGER INJECTION;  Surgeon: Mcarthur Rossetti, MD;  Location: [place];  Service: Orthopedics;  Laterality: Right;  . synovial cyst removed     12/25/16  . TONSILLECTOMY    . TOTAL KNEE ARTHROPLASTY Left 03/28/2017    Procedure: LEFT TOTAL KNEE ARTHROPLASTY;  Surgeon: Mcarthur Rossetti, MD;  Location[facility];  Service: Orthopedics;  Laterality: Left;  . TOTAL KNEE ARTHROPLASTY Right 10/07/2017   Procedure: RIGHT TOTAL KNEE ARTHROPLASTY;  Surgeon: Mcarthur Rossetti, MD;  Location: [place];  Service: Orthopedics;  Laterality: Right;    There were no vitals filed for this visit.  Subjective Assessment - 11/17/17 0928    Subjective  "I feel good today. I am a little sore. I didn't take my naproxen yesterday."    Currently in Pain?  Yes    Pain Score  3     Pain Location  Knee    Pain Orientation  Right    Pain Descriptors / Indicators  Sore;Aching    Pain Type  Surgical pain    Pain Onset  1 to 4 weeks ago    Pain Frequency  Constant         [facility] PT Assessment - 11/17/17 0001      AROM   Right Knee Extension  12    Right Knee Flexion  118      Strength   Right Hip Flexion  4+/5    Right Hip Extension  3+/5    Right Hip ABduction  4/5    Left Hip Flexion  5/5  Left Hip Extension  4/5    Left Hip ABduction  4+/5    Right Knee Flexion  4+/5    Right Knee Extension  4+/5    Left Knee Flexion  4-/5    Left Knee Extension  4+/5    Right Ankle Dorsiflexion  4+/5                   [facility] Adult PT Treatment/Exercise - 11/17/17 0001      Knee/Hip Exercises: Stretches   Passive Hamstring Stretch  2 reps;Right;30 seconds      Knee/Hip Exercises: Aerobic   Stationary Bike  L3 x 4 min    Elliptical  L3 x 4 min (3 incline)      Knee/Hip Exercises: Prone   Prone Knee Hang  2 minutes no weight; towel under R distal femur      Vasopneumatic   Number Minutes Vasopneumatic   10 minutes    Vasopnuematic Location   Knee right    Vasopneumatic Pressure  Medium    Vasopneumatic Temperature   34      Manual Therapy   Manual Therapy  Passive ROM;Joint mobilization;Taping    Joint Mobilization  tibial A>P grade 3-4 with knee flexed; tibial P>A mob grade 4 with mobilization  belt; A>P mob with active quad set; grade 3-4 superior patellar mobs with quad set    Passive ROM  forced R knee flexion with therapists arm in pt's popliteal space applying force at pt's distal tibia    Kinesiotex  Edema      Kinesiotix   Edema  R knee taping to reduce swelling; no stretch applied             PT Education - 11/17/17 1021    Education Details  HEP update with prone knee hangs, reassessment findings    Person(s) Educated  Patient    Methods  Explanation;Handout    Comprehension  Verbalized understanding       PT Short Term Goals - 11/17/17 1031      PT SHORT TERM GOAL #1   Title  Patient to be compliant with appropriate HEP, to be updated PRN     Status  Achieved      PT SHORT TERM GOAL #2   Title  Patient to improve L knee AROM to 0-115 degrees in order to improve gross knee ROM and general biomechanics     Baseline  Knee extension 12 degrees    Time  4    Period  Weeks    Status  On-going    Target Date  12/15/17      PT SHORT TERM GOAL #3   Title  Patient to demonstrate ability to ambulate without assistive device and consistent heel-toe pattern, equal step lengths/stance times, in order to improve general mobility     Baseline  fluctuating in nature    Time  4    Period  Weeks    Status  On-going    Target Date  12/15/17      PT SHORT TERM GOAL #4   Title  Patient to report R knee pain as being no more than 2/10 in order to show improvement of general condition     Baseline  pain 3/10 today. Pain fluctuates between 2/10 and 3/10 each day    Time  4    Period  Weeks    Status  On-going    Target Date  12/15/17  PT Long Term Goals - 11/17/17 1034      PT LONG TERM GOAL #1   Title  Patient to demonstrate MMT as being 5/5 in all tested muscle groups in order to improve gross knee stability and show improved strength     Time  4    Period  Weeks    Status  On-going    Target Date  12/15/17      PT LONG TERM GOAL #2   Title   Patient to be able to reciprocally ascend/descend full flight of stairs with U railing, good eccentric control, and minimal unsteadiness in order to improve access to home and community     Status  Achieved      PT LONG TERM GOAL #3   Title  Patient to be able to complete mock-bowling task with Mod(I) in order to demonstrate strength and balance needed to safely complete this activity independently     Time  4    Period  Weeks    Status  On-going    Target Date  12/15/17      PT LONG TERM GOAL #4   Title  Patient to return to regular exercise programming with personal trainer and no increase in pain in order to asisst in return to PLOF based activities     Time  4    Period  Weeks    Status  On-going    Target Date  12/15/17            Plan - 11/17/17 1036    Clinical Impression Statement  Reassessed pt's ROM; pt able to achieve 118 degrees of R knee flexion. He is still lacking a moderate amount of R knee extension. His amount of knee extension fluctuates each visit. Today pt lacking 12 degrees of extension most likely due to remaining swelling in knee. Pt demonstrates moderate amounts of pitting edema. Treatment focused on knee and patellar mobilizations to encourage increased extension. Also utilized kinesiotape and vasopneumatic compression (Game ready) to reduce swelling. Updated HEP to include prone knee hangs x 5 min 3x/day. Pt would benefit from further OPPT services to address pain, ROM, strength, balance, and functional mobility.      Rehab Potential  Excellent    PT Frequency  2x / week    PT Duration  4 weeks    PT Treatment/Interventions  ADLs/Self Care Home Management;Biofeedback;Cryotherapy;Electrical Stimulation;Iontophoresis 4mg /ml Dexamethasone;Moist Heat;Ultrasound;DME Instruction;Stair training;Functional mobility training;Gait training;Therapeutic activities;Therapeutic exercise;Balance training;Neuromuscular re-education;Patient/family education;Manual  techniques;Scar mobilization;Passive range of motion;Dry needling;Taping    PT Next Visit Plan  RESISTED WALKING with side steps, mobs for knee extension, superior patellar mobs, initial focus on ROM/edema and pain control, progress to advanced functional strength. Gait training on unlevel surfaces, strengthening using machines, wall squats update HEP PRN, balance with head turns, mini squats at parallel bars, modified bowling, lunges    PT Home Exercise Plan  continue with HHPT HEP as it remains appropriate, prone knee hangs    Consulted and Agree with Plan of Care  Patient       Patient will benefit from skilled therapeutic intervention in order to improve the following deficits and impairments:  Abnormal gait, Increased fascial restricitons, Improper body mechanics, Pain, Decreased coordination, Decreased mobility, Decreased scar mobility, Increased muscle spasms, Postural dysfunction, Decreased activity tolerance, Decreased range of motion, Decreased strength, Hypomobility, Decreased balance, Difficulty walking, Impaired flexibility  Visit Diagnosis: Acute pain of right knee  Stiffness of right knee, not elsewhere classified  Localized edema  Muscle weakness (generalized)  Difficulty in walking, not elsewhere classified     Problem List Patient Active Problem List   Diagnosis Date Noted  . Status post total knee replacement, right 10/07/2017  . Unilateral primary osteoarthritis, right knee 07/14/2017  . History of left knee replacement 07/14/2017  . Trigger finger, right middle finger 06/09/2017  . Carpal tunnel syndrome, left upper limb 06/09/2017  . Status post total left knee replacement 03/28/2017  . Unilateral primary osteoarthritis, left knee 02/19/2017  . Chronic pain of left knee 02/19/2017  . S/P lumbar laminectomy 12/25/2016  . 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 HTN (hypertension)   . Hypercholesteremia   . Hypothyroidism   . PVC's (premature ventricular  contractions)    Worthy Flank, SPT 11/17/17 10:47 AM   [facility] [address] [place], [place], [address] Phone: [phone]   Fax:  [phone]  Name: Carlos Juarez MRN: [id number] Date of Birth: Mar 11, 1941

## 2017-11-18 ENCOUNTER — Ambulatory Visit: Payer: Medicare Other | Admitting: Physical Therapy

## 2017-11-19 ENCOUNTER — Encounter: Payer: Self-pay | Admitting: Physical Therapy

## 2017-11-19 ENCOUNTER — Ambulatory Visit: Payer: Medicare Other | Admitting: Physical Therapy

## 2017-11-19 DIAGNOSIS — M6281 Muscle weakness (generalized): Secondary | ICD-10-CM | POA: Diagnosis not present

## 2017-11-19 DIAGNOSIS — R600 Localized edema: Secondary | ICD-10-CM

## 2017-11-19 DIAGNOSIS — M25661 Stiffness of right knee, not elsewhere classified: Secondary | ICD-10-CM | POA: Diagnosis not present

## 2017-11-19 DIAGNOSIS — M25561 Pain in right knee: Secondary | ICD-10-CM | POA: Diagnosis not present

## 2017-11-19 DIAGNOSIS — R262 Difficulty in walking, not elsewhere classified: Secondary | ICD-10-CM | POA: Diagnosis not present

## 2017-11-19 NOTE — Therapy (Signed)
[place], [place], [address] Phone: [phone]   Fax:  [phone]  Physical Therapy Treatment  Patient Details  Name: Carlos Juarez MRN: [id number] Date of Birth: [birth year] Referring Provider: Mcarthur Rossetti    Encounter Date: 11/19/2017  PT End of Session - 11/19/17 1513    Visit Number  13    Number of Visits  20    Date for PT Re-Evaluation  12/15/17    Authorization Type  Medicare/TRICARE (PN visit 76,  KX visit 33)    Authorization Time Period  10/21/17 to 12/22/17    PT Start Time  1330    PT Stop Time  1414    PT Time Calculation (min)  44 min    Equipment Utilized During Treatment  Gait belt    Activity Tolerance  Patient tolerated treatment well    Behavior During Therapy  [facility] for tasks assessed/performed       Past Medical History:  Diagnosis Date  . Arthritis   . Cancer ([place])    skin cancer removed rom nose non melanoma  . HTN (hypertension)   . Hypercholesteremia   . Hypothyroidism   . Pneumonia   . PVC's (premature ventricular contractions)   . Synovial cyst   . Trigger middle finger    right    Past Surgical History:  Procedure Laterality Date  . CARPAL TUNNEL RELEASE    . HERNIA REPAIR     as a child r inguinal  . JOINT REPLACEMENT     TKA Dr. [name] 03/28/17  . LUMBAR LAMINECTOMY/DECOMPRESSION MICRODISCECTOMY N/A 12/25/2016   Procedure: Laminectomy and Foraminotomy - Lumbar two-Lumbar three - Lumbar three-Lumbar four, resection of synovial cyst;  Surgeon: Eustace Moore, MD;  Location: [facility];  Service: Neurosurgery;  Laterality: N/A;  . MENISCUS REPAIR Left   . STERIOD INJECTION Right 10/07/2017   Procedure: RIGHT MIDDLE TRIGGER FINGER INJECTION;  Surgeon: Mcarthur Rossetti, MD;  Location: [facility];  Service: Orthopedics;  Laterality: Right;  . synovial cyst removed     12/25/16  . TONSILLECTOMY    . TOTAL KNEE ARTHROPLASTY Left 03/28/2017   Procedure: LEFT  TOTAL KNEE ARTHROPLASTY;  Surgeon: Mcarthur Rossetti, MD;  [facility];  Service: Orthopedics;  Laterality: Left;  . TOTAL KNEE ARTHROPLASTY Right 10/07/2017   Procedure: RIGHT TOTAL KNEE ARTHROPLASTY;  Surgeon: Mcarthur Rossetti, MD;  Location: [facility];  Service: Orthopedics;  Laterality: Right;    There were no vitals filed for this visit.  Subjective Assessment - 11/19/17 1338    Subjective  "I feel fine. I have been a little sore. i think the tape is helping with the swelling."    Currently in Pain?  Yes    Pain Score  2     Pain Location  Knee    Pain Orientation  Left    Pain Descriptors / Indicators  Sore    Pain Type  Surgical pain                       [facility] Adult PT Treatment/Exercise - 11/19/17 0001      Knee/Hip Exercises: Stretches   Passive Hamstring Stretch  Right;30 seconds;3 reps    Gastroc Stretch  3 reps;30 seconds;Both      Knee/Hip Exercises: Aerobic   Stationary Bike  L3 x 4 min    Elliptical  L3 x 4 min (5 incline)  Knee/Hip Exercises: Standing   Other Standing Knee Exercises  resisted walking side steps 4 x 43ft each direction 10 lbs    Other Standing Knee Exercises  TKEs with weightshifting on 2 in step and blue theraband  x 10 reps      Manual Therapy   Joint Mobilization  grade 3-4 superior patellar mobilizations    Passive ROM  Forced sustained knee extension in supine 5 x 45 seconds initially with quad set, no quad set last 3 sets               PT Short Term Goals - 11/17/17 1031      PT SHORT TERM GOAL #1   Title  Patient to be compliant with appropriate HEP, to be updated PRN     Status  Achieved      PT SHORT TERM GOAL #2   Title  Patient to improve L knee AROM to 0-115 degrees in order to improve gross knee ROM and general biomechanics     Baseline  Knee extension 12 degrees    Time  4    Period  Weeks    Status  On-going    Target Date  12/15/17      PT SHORT TERM GOAL #3   Title  Patient  to demonstrate ability to ambulate without assistive device and consistent heel-toe pattern, equal step lengths/stance times, in order to improve general mobility     Baseline  fluctuating in nature    Time  4    Period  Weeks    Status  On-going    Target Date  12/15/17      PT SHORT TERM GOAL #4   Title  Patient to report R knee pain as being no more than 2/10 in order to show improvement of general condition     Baseline  pain 3/10 today. Pain fluctuates between 2/10 and 3/10 each day    Time  4    Period  Weeks    Status  On-going    Target Date  12/15/17        PT Long Term Goals - 11/17/17 1034      PT LONG TERM GOAL #1   Title  Patient to demonstrate MMT as being 5/5 in all tested muscle groups in order to improve gross knee stability and show improved strength     Time  4    Period  Weeks    Status  On-going    Target Date  12/15/17      PT LONG TERM GOAL #2   Title  Patient to be able to reciprocally ascend/descend full flight of stairs with U railing, good eccentric control, and minimal unsteadiness in order to improve access to home and community     Status  Achieved      PT LONG TERM GOAL #3   Title  Patient to be able to complete mock-bowling task with Mod(I) in order to demonstrate strength and balance needed to safely complete this activity independently     Time  4    Period  Weeks    Status  On-going    Target Date  12/15/17      PT LONG TERM GOAL #4   Title  Patient to return to regular exercise programming with personal trainer and no increase in pain in order to asisst in return to PLOF based activities     Time  4    Period  Weeks  Status  On-going    Target Date  12/15/17            Plan - 11/19/17 1514    Clinical Impression Statement  Treatment today continued to focus on increasing R knee extension. Pt still lacking approximately 10 degrees of extension; 6 degrees with forced extension by therapist. Pt performed prone knee hangs at home.  Incorporated TKEs with weight shifting and blue theraband to encourage increased knee extension    PT Treatment/Interventions  ADLs/Self Care Home Management;Biofeedback;Cryotherapy;Electrical Stimulation;Iontophoresis 4mg /ml Dexamethasone;Moist Heat;Ultrasound;DME Instruction;Stair training;Functional mobility training;Gait training;Therapeutic activities;Therapeutic exercise;Balance training;Neuromuscular re-education;Patient/family education;Manual techniques;Scar mobilization;Passive range of motion;Dry needling;Taping    PT Next Visit Plan  RESISTED WALKING with side steps, mobs for knee extension, superior patellar mobs, initial focus on ROM/edema and pain control, progress to advanced functional strength. Gait training on unlevel surfaces, strengthening using machines, wall squats update HEP PRN, balance with head turns, mini squats at parallel bars, modified bowling, lunges    PT Home Exercise Plan  continue with HHPT HEP as it remains appropriate, prone knee hangs    Consulted and Agree with Plan of Care  Patient       Patient will benefit from skilled therapeutic intervention in order to improve the following deficits and impairments:  Abnormal gait, Increased fascial restricitons, Improper body mechanics, Pain, Decreased coordination, Decreased mobility, Decreased scar mobility, Increased muscle spasms, Postural dysfunction, Decreased activity tolerance, Decreased range of motion, Decreased strength, Hypomobility, Decreased balance, Difficulty walking, Impaired flexibility  Visit Diagnosis: No diagnosis found.     Problem List Patient Active Problem List   Diagnosis Date Noted  . Status post total knee replacement, right 10/07/2017  . Unilateral primary osteoarthritis, right knee 07/14/2017  . History of left knee replacement 07/14/2017  . Trigger finger, right middle finger 06/09/2017  . Carpal tunnel syndrome, left upper limb 06/09/2017  . Status post total left knee replacement  03/28/2017  . Unilateral primary osteoarthritis, left knee 02/19/2017  . Chronic pain of left knee 02/19/2017  . S/P lumbar laminectomy 12/25/2016  . 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 HTN (hypertension)   . Hypercholesteremia   . Hypothyroidism   . PVC's (premature ventricular contractions)    Worthy Flank, SPT 11/19/17 3:20 PM   [facility] [address] [place], [place], [address] Phone: [phone]   Fax:  [phone]  Name: Carlos Juarez MRN: [id number] Date of Birth: 1940-05-24

## 2017-11-21 ENCOUNTER — Other Ambulatory Visit: Payer: Self-pay | Admitting: Orthopaedic Surgery

## 2017-11-24 ENCOUNTER — Ambulatory Visit: Payer: Medicare Other | Admitting: Physical Therapy

## 2017-11-24 ENCOUNTER — Encounter: Payer: Self-pay | Admitting: Physical Therapy

## 2017-11-24 DIAGNOSIS — M25561 Pain in right knee: Secondary | ICD-10-CM | POA: Diagnosis not present

## 2017-11-24 DIAGNOSIS — M6281 Muscle weakness (generalized): Secondary | ICD-10-CM

## 2017-11-24 DIAGNOSIS — R262 Difficulty in walking, not elsewhere classified: Secondary | ICD-10-CM

## 2017-11-24 DIAGNOSIS — R600 Localized edema: Secondary | ICD-10-CM

## 2017-11-24 DIAGNOSIS — M25661 Stiffness of right knee, not elsewhere classified: Secondary | ICD-10-CM

## 2017-11-24 NOTE — Therapy (Signed)
[place], [place], [address] Phone: [phone]   Fax:  [phone]  Physical Therapy Treatment  Patient Details  Name: Carlos Juarez MRN: [id number] Date of Birth: 16-Oct-1940 Referring Provider: Mcarthur Rossetti    Encounter Date: 11/24/2017  PT End of Session - 11/24/17 1154    Visit Number  14    Number of Visits  20    Date for PT Re-Evaluation  12/15/17    Authorization Type  Medicare/TRICARE (PN visit 33,  KX visit 34)    Authorization Time Period  10/21/17 to 12/22/17    PT Start Time  1101    PT Stop Time  1146    PT Time Calculation (min)  45 min    Activity Tolerance  Patient tolerated treatment well    Behavior During Therapy  [facility] for tasks assessed/performed       Past Medical History:  Diagnosis Date  . Arthritis   . Cancer ([place])    skin cancer removed rom nose non melanoma  . HTN (hypertension)   . Hypercholesteremia   . Hypothyroidism   . Pneumonia   . PVC's (premature ventricular contractions)   . Synovial cyst   . Trigger middle finger    right    Past Surgical History:  Procedure Laterality Date  . CARPAL TUNNEL RELEASE    . HERNIA REPAIR     as a child r inguinal  . JOINT REPLACEMENT     TKA Dr. [name] 03/28/17  . LUMBAR LAMINECTOMY/DECOMPRESSION MICRODISCECTOMY N/A 12/25/2016   Procedure: Laminectomy and Foraminotomy - Lumbar two-Lumbar three - Lumbar three-Lumbar four, resection of synovial cyst;  Surgeon: Eustace Moore, MD;  Location: [facility];  Service: Neurosurgery;  Laterality: N/A;  . MENISCUS REPAIR Left   . STERIOD INJECTION Right 10/07/2017   Procedure: RIGHT MIDDLE TRIGGER FINGER INJECTION;  Surgeon: Mcarthur Rossetti, MD;  Location: [facility];  Service: Orthopedics;  Laterality: Right;  . synovial cyst removed     12/25/16  . TONSILLECTOMY    . TOTAL KNEE ARTHROPLASTY Left 03/28/2017   Procedure: LEFT TOTAL KNEE ARTHROPLASTY;  Surgeon: Mcarthur Rossetti, MD;  [facility];  Service: Orthopedics;  Laterality: Left;  . TOTAL KNEE ARTHROPLASTY Right 10/07/2017   Procedure: RIGHT TOTAL KNEE ARTHROPLASTY;  Surgeon: Mcarthur Rossetti, MD;  Location: [facility];  Service: Orthopedics;  Laterality: Right;    There were no vitals filed for this visit.  Subjective Assessment - 11/24/17 1152    Subjective  "I feel good. Only 2/10 pain today."    Currently in Pain?  Yes    Pain Score  2     Pain Location  Knee    Pain Orientation  Left    Pain Descriptors / Indicators  Sore   some stiffnes   Pain Type  Surgical pain    Pain Onset  More than a month ago    Pain Frequency  Constant    Aggravating Factors   sitting for too long    Pain Relieving Factors  execises, stretch, ice         [facility] PT Assessment - 11/24/17 0001      AROM   Right Knee Extension  7    Right Knee Flexion  120                   [facility] Adult PT Treatment/Exercise - 11/24/17 0001      Knee/Hip Exercises:  Stretches   Passive Hamstring Stretch  Right;30 seconds;3 reps      Knee/Hip Exercises: Aerobic   Stationary Bike  L4 x 4 min    Elliptical  L4 x 4 min      Knee/Hip Exercises: Supine   Quad Sets  1 set;Right;AROM   x 15     Manual Therapy   Manual Therapy  Passive ROM;Joint mobilization;Taping;Soft tissue mobilization    Joint Mobilization  grade 3-4 superior patellar mobilizations    Soft tissue mobilization  IASTM R hamstrings    Passive ROM  Forced sustained knee extension in supine 5 x 30-45 seconds             PT Education - 11/24/17 1153    Education Details  knee ROM    Person(s) Educated  Patient    Methods  Explanation    Comprehension  Verbalized understanding       PT Short Term Goals - 11/17/17 1031      PT SHORT TERM GOAL #1   Title  Patient to be compliant with appropriate HEP, to be updated PRN     Status  Achieved      PT SHORT TERM GOAL #2   Title  Patient to improve L knee AROM to 0-115  degrees in order to improve gross knee ROM and general biomechanics     Baseline  Knee extension 12 degrees    Time  4    Period  Weeks    Status  On-going    Target Date  12/15/17      PT SHORT TERM GOAL #3   Title  Patient to demonstrate ability to ambulate without assistive device and consistent heel-toe pattern, equal step lengths/stance times, in order to improve general mobility     Baseline  fluctuating in nature    Time  4    Period  Weeks    Status  On-going    Target Date  12/15/17      PT SHORT TERM GOAL #4   Title  Patient to report R knee pain as being no more than 2/10 in order to show improvement of general condition     Baseline  pain 3/10 today. Pain fluctuates between 2/10 and 3/10 each day    Time  4    Period  Weeks    Status  On-going    Target Date  12/15/17        PT Long Term Goals - 11/17/17 1034      PT LONG TERM GOAL #1   Title  Patient to demonstrate MMT as being 5/5 in all tested muscle groups in order to improve gross knee stability and show improved strength     Time  4    Period  Weeks    Status  On-going    Target Date  12/15/17      PT LONG TERM GOAL #2   Title  Patient to be able to reciprocally ascend/descend full flight of stairs with U railing, good eccentric control, and minimal unsteadiness in order to improve access to home and community     Status  Achieved      PT LONG TERM GOAL #3   Title  Patient to be able to complete mock-bowling task with Mod(I) in order to demonstrate strength and balance needed to safely complete this activity independently     Time  4    Period  Weeks    Status  On-going  Target Date  12/15/17      PT LONG TERM GOAL #4   Title  Patient to return to regular exercise programming with personal trainer and no increase in pain in order to asisst in return to PLOF based activities     Time  4    Period  Weeks    Status  On-going    Target Date  12/15/17            Plan - 11/24/17 1154     Clinical Impression Statement  Treatment today focused on manual therapy to increase R knee extension including superior patellar mobilizations, forced & sustained knee extension, and IASTM to R hamstrings. Following manual therapy and while having pt perform multiple quad sets, pt able to achieve 7 degrees from full knee extension. Pt was also able to achieve 120 degrees of knee flexion.     PT Treatment/Interventions  ADLs/Self Care Home Management;Biofeedback;Cryotherapy;Electrical Stimulation;Iontophoresis 4mg /ml Dexamethasone;Moist Heat;Ultrasound;DME Instruction;Stair training;Functional mobility training;Gait training;Therapeutic activities;Therapeutic exercise;Balance training;Neuromuscular re-education;Patient/family education;Manual techniques;Scar mobilization;Passive range of motion;Dry needling;Taping    PT Next Visit Plan  manual therapy to focus on increasing R knee extension including superior patellar mobs, joint mobs prn, and hamstring stretching; hip, knee, and ankle strengthening, balance with head turns, lunges    PT Home Exercise Plan  continue with HHPT HEP as it remains appropriate, prone knee hangs    Consulted and Agree with Plan of Care  Patient       Patient will benefit from skilled therapeutic intervention in order to improve the following deficits and impairments:  Abnormal gait, Increased fascial restricitons, Improper body mechanics, Pain, Decreased coordination, Decreased mobility, Decreased scar mobility, Increased muscle spasms, Postural dysfunction, Decreased activity tolerance, Decreased range of motion, Decreased strength, Hypomobility, Decreased balance, Difficulty walking, Impaired flexibility  Visit Diagnosis: Acute pain of right knee  Stiffness of right knee, not elsewhere classified  Localized edema  Muscle weakness (generalized)  Difficulty in walking, not elsewhere classified     Problem List Patient Active Problem List   Diagnosis Date Noted   . Status post total knee replacement, right 10/07/2017  . Unilateral primary osteoarthritis, right knee 07/14/2017  . History of left knee replacement 07/14/2017  . Trigger finger, right middle finger 06/09/2017  . Carpal tunnel syndrome, left upper limb 06/09/2017  . Status post total left knee replacement 03/28/2017  . Unilateral primary osteoarthritis, left knee 02/19/2017  . Chronic pain of left knee 02/19/2017  . S/P lumbar laminectomy 12/25/2016  . 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 HTN (hypertension)   . Hypercholesteremia   . Hypothyroidism   . PVC's (premature ventricular contractions)    Worthy Flank, SPT 11/24/17 12:35 PM   [facility] [address] [place], [place], [address] Phone: [phone]   Fax:  [phone]  Name: JACORION KLEM MRN: [id number] Date of Birth: Jul 10, 1940

## 2017-11-26 ENCOUNTER — Ambulatory Visit (INDEPENDENT_AMBULATORY_CARE_PROVIDER_SITE_OTHER): Payer: Medicare Other | Admitting: Orthopaedic Surgery

## 2017-11-26 ENCOUNTER — Encounter (INDEPENDENT_AMBULATORY_CARE_PROVIDER_SITE_OTHER): Payer: Self-pay | Admitting: Orthopaedic Surgery

## 2017-11-26 DIAGNOSIS — Z96651 Presence of right artificial knee joint: Secondary | ICD-10-CM

## 2017-11-26 DIAGNOSIS — M7061 Trochanteric bursitis, right hip: Secondary | ICD-10-CM | POA: Diagnosis not present

## 2017-11-26 MED ORDER — METHYLPREDNISOLONE ACETATE 40 MG/ML IJ SUSP
40.0000 mg | INTRAMUSCULAR | Status: AC | PRN
  Administered 2017-11-26: 40 mg via INTRA_ARTICULAR

## 2017-11-26 MED ORDER — LIDOCAINE HCL 1 % IJ SOLN
3.0000 mL | INTRAMUSCULAR | Status: AC | PRN
  Administered 2017-11-26: 3 mL

## 2017-11-26 NOTE — Progress Notes (Signed)
Office Visit Note   Patient: Carlos Juarez           Date of Birth: [birth year]           MRN: [id number] Visit Date: 11/26/2017              Requested by: Dorothyann Peng, NP [place], [place] [address] PCP: Dorothyann Peng, NP   Assessment & Plan: Visit Diagnoses:  1. Status post right knee replacement   2. Status post total knee replacement, right   3. Trochanteric bursitis, right hip     Plan:  Continue to work on quad strengthening both knees.  We will see him back in March of next year and at that time obtain AP and lateral views of both knees.  In regards to his right hip bursitis shown IT band stretching exercise.  Perform this.  Follow-up sooner if he has any questions or concerns.  Follow-Up Instructions: Return in about 7 months (around 06/27/2018) for Radiographs.   Orders:  Orders Placed This Encounter  Procedures  . Large Joint Inj   No orders of the defined types were placed in this encounter.     Procedures: Large Joint Inj on 11/26/2017 10:40 AM Indications: pain Details: 22 G 1.5 in needle, lateral approach  Arthrogram: No  Medications: 3 mL lidocaine 1 %; 40 mg methylPREDNISolone acetate 40 MG/ML Outcome: tolerated well, no immediate complications Procedure, treatment alternatives, risks and benefits explained, specific risks discussed. Consent was given by the patient. Immediately prior to procedure a time out was called to verify the correct patient, procedure, equipment, support staff and site/side marked as required. Patient was prepped and draped in the usual sterile fashion.       Clinical Data: No additional findings.   Subjective: Chief Complaint  Patient presents with  . Right Knee - Routine Post Op    HPI  Carlos Juarez returns today follow-up of his right total knee arthroplasty and right middle finger trigger finger injection.  He is now 50 days status post right total knee arthroplasty and injection of the  finger.  He states both the finger in the knee are doing well.  He does lack a few degrees and full extension of the knee and flexes to around 120 degrees and is working with a Physiological scientist.  He is having some pain lateral aspect of the right hip.  Wakes him up around the hip at night.   Review of Systems Please see HPI otherwise negative  Objective: Vital Signs: There were no vitals taken for this visit.  Physical Exam  Constitutional: He is oriented to person, place, and time. He appears well-developed and well-nourished. No distress.  Pulmonary/Chest: Effort normal.  Neurological: He is alert and oriented to person, place, and time.  Skin: He is not diaphoretic.  Psychiatric: He has a normal mood and affect.    Ortho Exam Right knee he has near full extension maybe lacking 1 degree.  He flexes to 120 degrees.  No instability valgus varus stressing surgical incisions well-healed calf supple nontender.  Right hip he has tenderness over the trochanteric region.  Otherwise good range of motion of the hip without pain. Specialty Comments:  No specialty comments available.  Imaging: No results found.   PMFS History: Patient Active Problem List   Diagnosis Date Noted  . Status post total knee replacement, right 10/07/2017  . Unilateral primary osteoarthritis, right knee 07/14/2017  . History of left knee replacement 07/14/2017  .  Trigger finger, right middle finger 06/09/2017  . Carpal tunnel syndrome, left upper limb 06/09/2017  . Status post total left knee replacement 03/28/2017  . Unilateral primary osteoarthritis, left knee 02/19/2017  . Chronic pain of left knee 02/19/2017  . S/P lumbar laminectomy 12/25/2016  . 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 HTN (hypertension)   . Hypercholesteremia   . Hypothyroidism   . PVC's (premature ventricular contractions)    Past Medical History:  Diagnosis Date  . Arthritis   . Cancer ([place])    skin cancer removed rom nose non  melanoma  . HTN (hypertension)   . Hypercholesteremia   . Hypothyroidism   . Pneumonia   . PVC's (premature ventricular contractions)   . Synovial cyst   . Trigger middle finger    right    Family History  Problem Relation Age of Onset  . Hypertension Mother   . Heart disease Father   . Heart attack Father 85    Past Surgical History:  Procedure Laterality Date  . CARPAL TUNNEL RELEASE    . HERNIA REPAIR     as a child r inguinal  . JOINT REPLACEMENT     TKA Dr. [name] 03/28/17  . LUMBAR LAMINECTOMY/DECOMPRESSION MICRODISCECTOMY N/A 12/25/2016   Procedure: Laminectomy and Foraminotomy - Lumbar two-Lumbar three - Lumbar three-Lumbar four, resection of synovial cyst;  Surgeon: Eustace Moore, MD;  Location: [facility];  Service: Neurosurgery;  Laterality: N/A;  . MENISCUS REPAIR Left   . STERIOD INJECTION Right 10/07/2017   Procedure: RIGHT MIDDLE TRIGGER FINGER INJECTION;  Surgeon: Mcarthur Rossetti, MD;  Location: [facility];  Service: Orthopedics;  Laterality: Right;  . synovial cyst removed     12/25/16  . TONSILLECTOMY    . TOTAL KNEE ARTHROPLASTY Left 03/28/2017   Procedure: LEFT TOTAL KNEE ARTHROPLASTY;  Surgeon: Mcarthur Rossetti, MD;  [facility];  Service: Orthopedics;  Laterality: Left;  . TOTAL KNEE ARTHROPLASTY Right 10/07/2017   Procedure: RIGHT TOTAL KNEE ARTHROPLASTY;  Surgeon: Mcarthur Rossetti, MD;  Location: [facility];  Service: Orthopedics;  Laterality: Right;   Social History   Occupational History  . Not on file  Tobacco Use  . Smoking status: Former Smoker    Packs/day: 1.00    Years: 15.00    Pack years: 15.00    Last attempt to quit: 07/15/1974    Years since quitting: 43.3  . Smokeless tobacco: Never Used  . Tobacco comment: smoked heavy 15 years and then quit  Substance and Sexual Activity  . Alcohol use: Yes    Alcohol/week: 0.0 standard drinks    Comment: Wine with meals at times   . Drug use: No  . Sexual activity: Yes

## 2017-11-27 ENCOUNTER — Ambulatory Visit: Payer: Medicare Other

## 2017-11-27 DIAGNOSIS — R600 Localized edema: Secondary | ICD-10-CM | POA: Diagnosis not present

## 2017-11-27 DIAGNOSIS — M6281 Muscle weakness (generalized): Secondary | ICD-10-CM | POA: Diagnosis not present

## 2017-11-27 DIAGNOSIS — R262 Difficulty in walking, not elsewhere classified: Secondary | ICD-10-CM

## 2017-11-27 DIAGNOSIS — M25661 Stiffness of right knee, not elsewhere classified: Secondary | ICD-10-CM

## 2017-11-27 DIAGNOSIS — M25561 Pain in right knee: Secondary | ICD-10-CM

## 2017-11-27 NOTE — Therapy (Signed)
[place], [place], [address] Phone: [phone]   Fax:  [phone]  Physical Therapy Treatment  Patient Details  Name: Carlos Juarez MRN: [id number] Date of Birth: [birth year] Referring Provider: Mcarthur Rossetti    Encounter Date: 11/27/2017  PT End of Session - 11/27/17 1107    Visit Number  15    Number of Visits  20    Date for PT Re-Evaluation  12/15/17    Authorization Type  Medicare/TRICARE (PN visit 25,  KX visit 82)    Authorization Time Period  10/21/17 to 12/22/17    Authorization - Visit Number  4    Authorization - Number of Visits  10    PT Start Time  1100    PT Stop Time  1145    PT Time Calculation (min)  45 min    Activity Tolerance  Patient tolerated treatment well    Behavior During Therapy  [facility] for tasks assessed/performed       Past Medical History:  Diagnosis Date  . Arthritis   . 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 HTN (hypertension)   . Hypercholesteremia   . Hypothyroidism   . Pneumonia   . PVC's (premature ventricular contractions)   . Synovial cyst   . Trigger middle finger    right    Past Surgical History:  Procedure Laterality Date  . CARPAL TUNNEL RELEASE    . HERNIA REPAIR     as a child r inguinal  . JOINT REPLACEMENT     TKA Dr. [name] 03/28/17  . LUMBAR LAMINECTOMY/DECOMPRESSION MICRODISCECTOMY N/A 12/25/2016   Procedure: Laminectomy and Foraminotomy - Lumbar two-Lumbar three - Lumbar three-Lumbar four, resection of synovial cyst;  Surgeon: Eustace Moore, MD;  Location: [facility];  Service: Neurosurgery;  Laterality: N/A;  . MENISCUS REPAIR Left   . STERIOD INJECTION Right 10/07/2017   Procedure: RIGHT MIDDLE TRIGGER FINGER INJECTION;  Surgeon: Mcarthur Rossetti, MD;  Location: [facility];  Service: Orthopedics;  Laterality: Right;  . synovial cyst removed     12/25/16  . TONSILLECTOMY    . TOTAL KNEE ARTHROPLASTY Left  03/28/2017   Procedure: LEFT TOTAL KNEE ARTHROPLASTY;  Surgeon: Mcarthur Rossetti, MD;  Location: [facility];  Service: Orthopedics;  Laterality: Left;  . TOTAL KNEE ARTHROPLASTY Right 10/07/2017   Procedure: RIGHT TOTAL KNEE ARTHROPLASTY;  Surgeon: Mcarthur Rossetti, MD;  Location: [facility];  Service: Orthopedics;  Laterality: Right;    There were no vitals filed for this visit.  Subjective Assessment - 11/27/17 1105    Subjective  Knee no pain , RT hip pain . Had bursa injected.  No benefit yet    Pain Score  4     Pain Location  Hip    Pain Orientation  Right    Pain Descriptors / Indicators  Sore    Pain Type  Acute pain    Pain Onset  1 to 4 weeks ago    Pain Frequency  Intermittent    Aggravating Factors   worse at hip pain keeps awake at timesnight          [facility] PT Assessment - 11/27/17 0001      AROM   Right Knee Flexion  125                   [facility] Adult PT Treatment/Exercise - 11/27/17 0001  Knee/Hip Exercises: Stretches   Active Hamstring Stretch  Right   QS with knee ext off mat seated     Knee/Hip Exercises: Aerobic   Stationary Bike  L4 x 6 min      Knee/Hip Exercises: Standing   Forward Lunges  20 reps    Other Standing Knee Exercises  side step lunge x 20    Other Standing Knee Exercises  Single and double leg hip hinge x 15 reps,         Knee/Hip Exercises: Seated   Long Arc Quad  Right;20 reps;5 sets      Manual Therapy   Passive ROM  prone knee flexion  5 degrees less thn  to RT              PT Education - 11/27/17 1203    Education Details  Hip hinge exer cued to stop if has incr knee or hip pain    Person(s) Educated  Patient    Methods  Explanation;Demonstration;Tactile cues;Verbal cues    Comprehension  Returned demonstration;Verbalized understanding       PT Short Term Goals - 11/27/17 1206      PT SHORT TERM GOAL #1   Title  Patient to be compliant with appropriate HEP, to be updated PRN     Status   Achieved      PT SHORT TERM GOAL #2   Title  Patient to improve L knee AROM to 0-115 degrees in order to improve gross knee ROM and general biomechanics     Status  Unable to assess      PT SHORT TERM GOAL #3   Title  Patient to demonstrate ability to ambulate without assistive device and consistent heel-toe pattern, equal step lengths/stance times, in order to improve general mobility     Status  On-going      PT SHORT TERM GOAL #4   Title  Patient to report R knee pain as being no more than 2/10 in order to show improvement of general condition     Status  On-going        PT Long Term Goals - 11/17/17 1034      PT LONG TERM GOAL #1   Title  Patient to demonstrate MMT as being 5/5 in all tested muscle groups in order to improve gross knee stability and show improved strength     Time  4    Period  Weeks    Status  On-going    Target Date  12/15/17      PT LONG TERM GOAL #2   Title  Patient to be able to reciprocally ascend/descend full flight of stairs with U railing, good eccentric control, and minimal unsteadiness in order to improve access to home and community     Status  Achieved      PT LONG TERM GOAL #3   Title  Patient to be able to complete mock-bowling task with Mod(I) in order to demonstrate strength and balance needed to safely complete this activity independently     Time  4    Period  Weeks    Status  On-going    Target Date  12/15/17      PT LONG TERM GOAL #4   Title  Patient to return to regular exercise programming with personal trainer and no increase in pain in order to asisst in return to PLOF based activities     Time  4    Period  Weeks  Status  On-going    Target Date  12/15/17            Plan - 11/27/17 1108    Clinical Impression Statement  Worked on hip strength more today .  He did well with hip hinge exercise.  Progressing well but we should be careful with hip pain to not aggravate bursitis    PT Next Visit Plan  manual therapy to  focus on increasing R knee extension including superior patellar mobs, joint mobs prn, and hamstring stretching; hip, knee, and ankle strengthening, balance with head turns, lunges    PT Home Exercise Plan  continue with HHPT HEP as it remains appropriate, prone knee hangs    Consulted and Agree with Plan of Care  Patient       Patient will benefit from skilled therapeutic intervention in order to improve the following deficits and impairments:  Abnormal gait, Increased fascial restricitons, Improper body mechanics, Pain, Decreased coordination, Decreased mobility, Decreased scar mobility, Increased muscle spasms, Postural dysfunction, Decreased activity tolerance, Decreased range of motion, Decreased strength, Hypomobility, Decreased balance, Difficulty walking, Impaired flexibility  Visit Diagnosis: Acute pain of right knee  Stiffness of right knee, not elsewhere classified  Localized edema  Muscle weakness (generalized)  Difficulty in walking, not elsewhere classified     Problem List Patient Active Problem List   Diagnosis Date Noted  . Status post total knee replacement, right 10/07/2017  . Unilateral primary osteoarthritis, right knee 07/14/2017  . History of left knee replacement 07/14/2017  . Trigger finger, right middle finger 06/09/2017  . Carpal tunnel syndrome, left upper limb 06/09/2017  . Status post total left knee replacement 03/28/2017  . Unilateral primary osteoarthritis, left knee 02/19/2017  . Chronic pain of left knee 02/19/2017  . S/P lumbar laminectomy 12/25/2016  . 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 HTN (hypertension)   . Hypercholesteremia   . Hypothyroidism   . PVC's (premature ventricular contractions)     Darrel Hoover  PT 11/27/2017, 12:21 PM  [facility] [address] [place], [place], [address] Phone: [phone]   Fax:  [phone]  Name: Carlos Juarez MRN: [id number] Date of  Birth: Sep 20, 1940

## 2017-11-27 NOTE — Patient Instructions (Signed)
Hip Extension: Hamstring Single Leg Deadlift (Eccentric)   Holding weights, stand on affected leg with knee slightly flexed. Lift other leg while slowly bending forward at the hip. Use ___ lb weight. ___ reps per set, ___ sets per day, ___ days per week. Add ___ lbs when you achieve ___ repetitions. Touch floor with weight.  Copyright  VHI. All rights reserved.  Squat: Wide Leg   Feet wide apart, toes out, squat, holding weight between knees. Weight should be at ankles.  Bend at the hips with hips going back behind hips. Keep back straight.  Put eight on stool if you cannot get to floor.  Repeat _3-15___ times per set. Do ___1-2_ sets per session. Do __2-5__ sessions per week. Use __0-10__ lb weight.  Copyright  VHI. All rights reserved.  Squat: Half   Arms hanging at sides, squat by dropping hips back as if sitting on a chair. Keep knees over ankles, hips behind heels.  Keep back straight.  Bend at hips and knees will bend with hips.   Repeat ___3-15_ times per set. Do __1-2__ sets per session. Do __2-5__ sessions per week. Use __0-10HIP / KNEE: Flexion / Extension, Squat Unilateral Hip / Glute Extension: Standing - Straight Leg (Machine) Hip Extension (Standing) Healthy Back - Yoga Tree Balance  

## 2017-12-02 ENCOUNTER — Encounter: Payer: Self-pay | Admitting: Physical Therapy

## 2017-12-02 ENCOUNTER — Ambulatory Visit: Payer: Medicare Other | Admitting: Physical Therapy

## 2017-12-02 DIAGNOSIS — R600 Localized edema: Secondary | ICD-10-CM

## 2017-12-02 DIAGNOSIS — R262 Difficulty in walking, not elsewhere classified: Secondary | ICD-10-CM

## 2017-12-02 DIAGNOSIS — M25661 Stiffness of right knee, not elsewhere classified: Secondary | ICD-10-CM | POA: Diagnosis not present

## 2017-12-02 DIAGNOSIS — M6281 Muscle weakness (generalized): Secondary | ICD-10-CM | POA: Diagnosis not present

## 2017-12-02 DIAGNOSIS — M25561 Pain in right knee: Secondary | ICD-10-CM

## 2017-12-02 NOTE — Therapy (Signed)
[place], [place], [address] Phone: [phone]   Fax:  [phone]  Physical Therapy Treatment  Patient Details  Name: Carlos Juarez MRN: [id number] Date of Birth: 01/09/1941 Referring Provider: Mcarthur Rossetti    Encounter Date: 12/02/2017  PT End of Session - 12/02/17 1459    Visit Number  16    Number of Visits  20    Date for PT Re-Evaluation  12/15/17    Authorization Type  Medicare/TRICARE (PN visit 18,  KX visit 32)    Authorization Time Period  10/21/17 to 12/22/17    PT Start Time  1500    PT Stop Time  1543    PT Time Calculation (min)  43 min    Activity Tolerance  Patient tolerated treatment well    Behavior During Therapy  [facility] for tasks assessed/performed       Past Medical History:  Diagnosis Date  . Arthritis   . Cancer ([place])    skin cancer removed rom nose non melanoma  . HTN (hypertension)   . Hypercholesteremia   . Hypothyroidism   . Pneumonia   . PVC's (premature ventricular contractions)   . Synovial cyst   . Trigger middle finger    right    Past Surgical History:  Procedure Laterality Date  . CARPAL TUNNEL RELEASE    . HERNIA REPAIR     as a child r inguinal  . JOINT REPLACEMENT     TKA Dr. [name] 03/28/17  . LUMBAR LAMINECTOMY/DECOMPRESSION MICRODISCECTOMY N/A 12/25/2016   Procedure: Laminectomy and Foraminotomy - Lumbar two-Lumbar three - Lumbar three-Lumbar four, resection of synovial cyst;  Surgeon: Eustace Moore, MD;  Location: [facility];  Service: Neurosurgery;  Laterality: N/A;  . MENISCUS REPAIR Left   . STERIOD INJECTION Right 10/07/2017   Procedure: RIGHT MIDDLE TRIGGER FINGER INJECTION;  Surgeon: Mcarthur Rossetti, MD;  Location: [facility];  Service: Orthopedics;  Laterality: Right;  . synovial cyst removed     12/25/16  . TONSILLECTOMY    . TOTAL KNEE ARTHROPLASTY Left 03/28/2017   Procedure: LEFT TOTAL KNEE ARTHROPLASTY;  Surgeon: Mcarthur Rossetti, MD;  Location[facility];  Service: Orthopedics;  Laterality: Left;  . TOTAL KNEE ARTHROPLASTY Right 10/07/2017   Procedure: RIGHT TOTAL KNEE ARTHROPLASTY;  Surgeon: Mcarthur Rossetti, MD;  Location: [facility];  Service: Orthopedics;  Laterality: Right;    There were no vitals filed for this visit.  Subjective Assessment - 12/02/17 1459    Subjective  "I saw the MD and he was pleased, he said the extension would come but wanted me to continued strengthening. Today I am only a 2/10 and I think its because I am trying to avoid take medication"     Currently in Pain?  Yes    Pain Score  2     Pain Location  Knee    Pain Orientation  Right    Pain Type  Chronic pain    Pain Frequency  Intermittent    Aggravating Factors   intermittent    Pain Relieving Factors  exercise and stretching                       [facility] Adult PT Treatment/Exercise - 12/02/17 1504      Knee/Hip Exercises: Stretches   Active Hamstring Stretch  3 reps;30 seconds   PNF contract/ relax   ITB Stretch  2 reps;30 seconds  R only in L sidelying   Gastroc Stretch  3 reps;30 seconds   slant board     Knee/Hip Exercises: Aerobic   Elliptical  L5 x 5 min elevation L 5      Knee/Hip Exercises: Machines for Strengthening   Cybex Leg Press  2 x 15 20#, single leg with green band beneath R knee focusing on TKE to promote exteion   measured at end range pt demonstrated 3 degrees ext     Manual Therapy   Manual therapy comments  MTPR along the glute medius and piriformis    Joint Mobilization  grade 3-4 superior patellar mobilizations    Soft tissue mobilization  IASTM R hamstrings   also performed during hamstring stretchin   Passive ROM  g               PT Short Term Goals - 11/27/17 1206      PT SHORT TERM GOAL #1   Title  Patient to be compliant with appropriate HEP, to be updated PRN     Status  Achieved      PT SHORT TERM GOAL #2   Title  Patient to improve L knee AROM  to 0-115 degrees in order to improve gross knee ROM and general biomechanics     Status  Unable to assess      PT SHORT TERM GOAL #3   Title  Patient to demonstrate ability to ambulate without assistive device and consistent heel-toe pattern, equal step lengths/stance times, in order to improve general mobility     Status  On-going      PT SHORT TERM GOAL #4   Title  Patient to report R knee pain as being no more than 2/10 in order to show improvement of general condition     Status  On-going        PT Long Term Goals - 11/17/17 1034      PT LONG TERM GOAL #1   Title  Patient to demonstrate MMT as being 5/5 in all tested muscle groups in order to improve gross knee stability and show improved strength     Time  4    Period  Weeks    Status  On-going    Target Date  12/15/17      PT LONG TERM GOAL #2   Title  Patient to be able to reciprocally ascend/descend full flight of stairs with U railing, good eccentric control, and minimal unsteadiness in order to improve access to home and community     Status  Achieved      PT LONG TERM GOAL #3   Title  Patient to be able to complete mock-bowling task with Mod(I) in order to demonstrate strength and balance needed to safely complete this activity independently     Time  4    Period  Weeks    Status  On-going    Target Date  12/15/17      PT LONG TERM GOAL #4   Title  Patient to return to regular exercise programming with personal trainer and no increase in pain in order to asisst in return to PLOF based activities     Time  4    Period  Weeks    Status  On-going    Target Date  12/15/17            Plan - 12/02/17 1544    Clinical Impression Statement  pt reported that his MD was happy with  the progress he has made so far. continued working on knee extenion and hip/ quad strength with emphasis on TKE to promote extension. today pt was able to get to 3 degrees at end of session. end of session he reported doing better and  declined modalities.     PT Treatment/Interventions  ADLs/Self Care Home Management;Biofeedback;Cryotherapy;Electrical Stimulation;Iontophoresis 4mg /ml Dexamethasone;Moist Heat;Ultrasound;DME Instruction;Stair training;Functional mobility training;Gait training;Therapeutic activities;Therapeutic exercise;Balance training;Neuromuscular re-education;Patient/family education;Manual techniques;Scar mobilization;Passive range of motion;Dry needling;Taping    PT Next Visit Plan  manual therapy to focus on increasing R knee extension including superior patellar mobs, joint mobs prn, and hamstring stretching; hip, knee, and ankle strengthening, balance with head turns, lunges    PT Home Exercise Plan  continue with HHPT HEP as it remains appropriate, prone knee hangs    Consulted and Agree with Plan of Care  Patient       Patient will benefit from skilled therapeutic intervention in order to improve the following deficits and impairments:  Abnormal gait, Increased fascial restricitons, Improper body mechanics, Pain, Decreased coordination, Decreased mobility, Decreased scar mobility, Increased muscle spasms, Postural dysfunction, Decreased activity tolerance, Decreased range of motion, Decreased strength, Hypomobility, Decreased balance, Difficulty walking, Impaired flexibility  Visit Diagnosis: Acute pain of right knee  Stiffness of right knee, not elsewhere classified  Localized edema  Muscle weakness (generalized)  Difficulty in walking, not elsewhere classified     Problem List Patient Active Problem List   Diagnosis Date Noted  . Status post total knee replacement, right 10/07/2017  . Unilateral primary osteoarthritis, right knee 07/14/2017  . History of left knee replacement 07/14/2017  . Trigger finger, right middle finger 06/09/2017  . Carpal tunnel syndrome, left upper limb 06/09/2017  . Status post total left knee replacement 03/28/2017  . Unilateral primary osteoarthritis, left  knee 02/19/2017  . Chronic pain of left knee 02/19/2017  . S/P lumbar laminectomy 12/25/2016  . 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 HTN (hypertension)   . Hypercholesteremia   . Hypothyroidism   . PVC's (premature ventricular contractions)    Starr Lake PT, DPT, LAT, ATC  12/02/17  3:49 PM      [facility] [address] [place], [place], [address] Phone: [phone]   Fax:  [phone]  Name: Carlos Juarez MRN: [id number] Date of Birth: [birth year]

## 2017-12-04 ENCOUNTER — Encounter: Payer: Self-pay | Admitting: Physical Therapy

## 2017-12-04 ENCOUNTER — Ambulatory Visit: Payer: Medicare Other | Admitting: Physical Therapy

## 2017-12-04 DIAGNOSIS — M25661 Stiffness of right knee, not elsewhere classified: Secondary | ICD-10-CM | POA: Diagnosis not present

## 2017-12-04 DIAGNOSIS — R600 Localized edema: Secondary | ICD-10-CM

## 2017-12-04 DIAGNOSIS — R262 Difficulty in walking, not elsewhere classified: Secondary | ICD-10-CM | POA: Diagnosis not present

## 2017-12-04 DIAGNOSIS — M6281 Muscle weakness (generalized): Secondary | ICD-10-CM

## 2017-12-04 DIAGNOSIS — M25561 Pain in right knee: Secondary | ICD-10-CM | POA: Diagnosis not present

## 2017-12-04 NOTE — Therapy (Signed)
[place], [place], [address] Phone: [phone]   Fax:  [phone]  Physical Therapy Treatment  Patient Details  Name: Carlos Juarez MRN: [id number] Date of Birth: Apr 26, 1940 Referring Provider: Mcarthur Rossetti    Encounter Date: 12/04/2017  PT End of Session - 12/04/17 0931    Visit Number  17    Number of Visits  20    Date for PT Re-Evaluation  12/15/17    PT Start Time  0931    PT Stop Time  1015    PT Time Calculation (min)  44 min    Activity Tolerance  Patient tolerated treatment well    Behavior During Therapy  [facility] for tasks assessed/performed       Past Medical History:  Diagnosis Date  . Arthritis   . Cancer ([place])    skin cancer removed rom nose non melanoma  . HTN (hypertension)   . Hypercholesteremia   . Hypothyroidism   . Pneumonia   . PVC's (premature ventricular contractions)   . Synovial cyst   . Trigger middle finger    right    Past Surgical History:  Procedure Laterality Date  . CARPAL TUNNEL RELEASE    . HERNIA REPAIR     as a child r inguinal  . JOINT REPLACEMENT     TKA Dr. [name] 03/28/17  . LUMBAR LAMINECTOMY/DECOMPRESSION MICRODISCECTOMY N/A 12/25/2016   Procedure: Laminectomy and Foraminotomy - Lumbar two-Lumbar three - Lumbar three-Lumbar four, resection of synovial cyst;  Surgeon: Eustace Moore, MD;  Location: [place];  Service: Neurosurgery;  Laterality: N/A;  . MENISCUS REPAIR Left   . STERIOD INJECTION Right 10/07/2017   Procedure: RIGHT MIDDLE TRIGGER FINGER INJECTION;  Surgeon: Mcarthur Rossetti, MD;  Location: [place];  Service: Orthopedics;  Laterality: Right;  . synovial cyst removed     12/25/16  . TONSILLECTOMY    . TOTAL KNEE ARTHROPLASTY Left 03/28/2017   Procedure: LEFT TOTAL KNEE ARTHROPLASTY;  Surgeon: Mcarthur Rossetti, MD;  [facility];  Service: Orthopedics;  Laterality: Left;  . TOTAL KNEE ARTHROPLASTY Right  10/07/2017   Procedure: RIGHT TOTAL KNEE ARTHROPLASTY;  Surgeon: Mcarthur Rossetti, MD;  Location: [place];  Service: Orthopedics;  Laterality: Right;    There were no vitals filed for this visit.  Subjective Assessment - 12/04/17 0933    Subjective  " I was pretty sore after the last session for a day or two, but today I am at about a 1-2/10"     Currently in Pain?  Yes    Pain Score  1     Pain Location  Knee    Pain Orientation  Right    Pain Type  Chronic pain    Pain Onset  1 to 4 weeks ago    Pain Frequency  Intermittent         [facility] PT Assessment - 12/04/17 0001      AROM   Right Knee Extension  5                   [facility] Adult PT Treatment/Exercise - 12/04/17 0001      Knee/Hip Exercises: Stretches   Active Hamstring Stretch  2 reps;30 seconds    ITB Stretch  2 reps;30 seconds;Right    Gastroc Stretch  2 reps;30 seconds   slant board     Knee/Hip Exercises: Aerobic   Elliptical  L5 x 6 min elevation L  5      Knee/Hip Exercises: Machines for Strengthening   Cybex Knee Extension  2 x 15 bil 25 # concentric bil and eccentric with RLE     Cybex Leg Press  2 x 15 20#, single leg with green band beneath R knee focusing on TKE to promote exteion   with foot in eversion   Other Machine  heel raise using cybex leg press machine, 1 x 40# going to fatigue bil LE   cues to keep R knee as extended as possible.      Knee/Hip Exercises: Standing   Gait Training  retro walking in // with green band at posterior proximal tibia to promote knee extension       Knee/Hip Exercises: Supine   Quad Sets  2 sets;10 reps   with manual over pressure, holding last rep 30 sec     Manual Therapy   Joint Mobilization  grade 3-4 superior patellar mobilizations, and slow creep stretching holding 2 x min pushing patella superiorly    Passive ROM  forced sustained extension combined with active quad set, with tibia externally rotated to promote terminal extension   with towel  in popliteal space to promote quad activation              PT Short Term Goals - 11/27/17 1206      PT SHORT TERM GOAL #1   Title  Patient to be compliant with appropriate HEP, to be updated PRN     Status  Achieved      PT SHORT TERM GOAL #2   Title  Patient to improve L knee AROM to 0-115 degrees in order to improve gross knee ROM and general biomechanics     Status  Unable to assess      PT SHORT TERM GOAL #3   Title  Patient to demonstrate ability to ambulate without assistive device and consistent heel-toe pattern, equal step lengths/stance times, in order to improve general mobility     Status  On-going      PT SHORT TERM GOAL #4   Title  Patient to report R knee pain as being no more than 2/10 in order to show improvement of general condition     Status  On-going        PT Long Term Goals - 11/17/17 1034      PT LONG TERM GOAL #1   Title  Patient to demonstrate MMT as being 5/5 in all tested muscle groups in order to improve gross knee stability and show improved strength     Time  4    Period  Weeks    Status  On-going    Target Date  12/15/17      PT LONG TERM GOAL #2   Title  Patient to be able to reciprocally ascend/descend full flight of stairs with U railing, good eccentric control, and minimal unsteadiness in order to improve access to home and community     Status  Achieved      PT LONG TERM GOAL #3   Title  Patient to be able to complete mock-bowling task with Mod(I) in order to demonstrate strength and balance needed to safely complete this activity independently     Time  4    Period  Weeks    Status  On-going    Target Date  12/15/17      PT LONG TERM GOAL #4   Title  Patient to return to regular exercise programming  with personal trainer and no increase in pain in order to asisst in return to PLOF based activities     Time  4    Period  Weeks    Status  On-going    Target Date  12/15/17            Plan - 12/04/17 1016    Clinical  Impression Statement  pt reported some soreness following last session but reports he is feeling better today. continued patellar mobs and knee extension activities. continued with hip/ knee strengthening today which he continues to perform well. end of session reported no pain and declined modalities.     PT Next Visit Plan  manual therapy to focus on increasing R knee extension including superior patellar mobs, joint mobs prn, and hamstring stretching; hip, knee, and ankle strengthening, balance with head turns, lunges    PT Home Exercise Plan  continue with HHPT HEP as it remains appropriate, prone knee hangs    Consulted and Agree with Plan of Care  Patient       Patient will benefit from skilled therapeutic intervention in order to improve the following deficits and impairments:  Abnormal gait, Increased fascial restricitons, Improper body mechanics, Pain, Decreased coordination, Decreased mobility, Decreased scar mobility, Increased muscle spasms, Postural dysfunction, Decreased activity tolerance, Decreased range of motion, Decreased strength, Hypomobility, Decreased balance, Difficulty walking, Impaired flexibility  Visit Diagnosis: Acute pain of right knee  Stiffness of right knee, not elsewhere classified  Localized edema  Muscle weakness (generalized)     Problem List Patient Active Problem List   Diagnosis Date Noted  . Status post total knee replacement, right 10/07/2017  . Unilateral primary osteoarthritis, right knee 07/14/2017  . History of left knee replacement 07/14/2017  . Trigger finger, right middle finger 06/09/2017  . Carpal tunnel syndrome, left upper limb 06/09/2017  . Status post total left knee replacement 03/28/2017  . Unilateral primary osteoarthritis, left knee 02/19/2017  . Chronic pain of left knee 02/19/2017  . S/P lumbar laminectomy 12/25/2016  . 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 HTN (hypertension)   . Hypercholesteremia   . Hypothyroidism   .  PVC's (premature ventricular contractions)    Starr Lake PT, DPT, LAT, ATC  12/04/17  10:20 AM      [facility] [address] [place], [place], [address] Phone: [phone]   Fax:  [phone]  Name: Carlos Juarez MRN: [id number] Date of Birth: 1940-05-25

## 2017-12-08 ENCOUNTER — Ambulatory Visit: Payer: Medicare Other | Admitting: Physical Therapy

## 2017-12-08 DIAGNOSIS — M6281 Muscle weakness (generalized): Secondary | ICD-10-CM

## 2017-12-08 DIAGNOSIS — M25661 Stiffness of right knee, not elsewhere classified: Secondary | ICD-10-CM

## 2017-12-08 DIAGNOSIS — R600 Localized edema: Secondary | ICD-10-CM

## 2017-12-08 DIAGNOSIS — M25561 Pain in right knee: Secondary | ICD-10-CM

## 2017-12-08 DIAGNOSIS — R262 Difficulty in walking, not elsewhere classified: Secondary | ICD-10-CM | POA: Diagnosis not present

## 2017-12-08 NOTE — Therapy (Signed)
[place], [place], [address] Phone: [phone]   Fax:  [phone]  Physical Therapy Treatment  Patient Details  Name: Carlos Juarez MRN: [id number] Date of Birth: 05-Jun-1940 Referring Provider: Mcarthur Rossetti    Encounter Date: 12/08/2017  PT End of Session - 12/08/17 1057    Visit Number  18    Number of Visits  20    Date for PT Re-Evaluation  12/15/17    Authorization Type  Medicare/TRICARE (PN visit 69,  KX visit 51)    PT Start Time  1058    PT Stop Time  1143    PT Time Calculation (min)  45 min    Activity Tolerance  Patient tolerated treatment well    Behavior During Therapy  [facility] for tasks assessed/performed       Past Medical History:  Diagnosis Date  . Arthritis   . Cancer ([place])    skin cancer removed rom nose non melanoma  . HTN (hypertension)   . Hypercholesteremia   . Hypothyroidism   . Pneumonia   . PVC's (premature ventricular contractions)   . Synovial cyst   . Trigger middle finger    right    Past Surgical History:  Procedure Laterality Date  . CARPAL TUNNEL RELEASE    . HERNIA REPAIR     as a child r inguinal  . JOINT REPLACEMENT     TKA Dr. [name] 03/28/17  . LUMBAR LAMINECTOMY/DECOMPRESSION MICRODISCECTOMY N/A 12/25/2016   Procedure: Laminectomy and Foraminotomy - Lumbar two-Lumbar three - Lumbar three-Lumbar four, resection of synovial cyst;  Surgeon: Eustace Moore, MD;  Location: [place];  Service: Neurosurgery;  Laterality: N/A;  . MENISCUS REPAIR Left   . STERIOD INJECTION Right 10/07/2017   Procedure: RIGHT MIDDLE TRIGGER FINGER INJECTION;  Surgeon: Mcarthur Rossetti, MD;  Location: [place];  Service: Orthopedics;  Laterality: Right;  . synovial cyst removed     12/25/16  . TONSILLECTOMY    . TOTAL KNEE ARTHROPLASTY Left 03/28/2017   Procedure: LEFT TOTAL KNEE ARTHROPLASTY;  Surgeon: Mcarthur Rossetti, MD;  [facility];  Service:  Orthopedics;  Laterality: Left;  . TOTAL KNEE ARTHROPLASTY Right 10/07/2017   Procedure: RIGHT TOTAL KNEE ARTHROPLASTY;  Surgeon: Mcarthur Rossetti, MD;  Location: [place];  Service: Orthopedics;  Laterality: Right;    There were no vitals filed for this visit.  Subjective Assessment - 12/08/17 1058    Subjective  "No pain today and after the last session I wasn't feeling bad at all"     Currently in Pain?  Yes    Pain Score  0-No pain    Pain Location  Knee    Pain Orientation  Right         [facility] PT Assessment - 12/08/17 1117      AROM   Right Knee Extension  6                   [facility] Adult PT Treatment/Exercise - 12/08/17 1103      Therapeutic Activites    Therapeutic Activities  Other Therapeutic Activities    Other Therapeutic Activities  mimicked bowling with lb bowling ball x 10 and was able to maintan balance and reported no pain.       Knee/Hip Exercises: Stretches   Passive Hamstring Stretch  2 reps;Right;30 seconds    Gastroc Stretch  2 reps;30 seconds   slant board  Knee/Hip Exercises: Aerobic   Elliptical  L5 x 6 min elevation L7      Knee/Hip Exercises: Machines for Strengthening   Cybex Knee Extension  2 x going to fatigue bil 25 # concentric bil and eccentric with RLE       Knee/Hip Exercises: Standing   Gait Training  retro walking in // with green band at posterior proximal tibia to promote knee extension       Knee/Hip Exercises: Supine   Quad Sets  2 sets;10 reps   with manual overperssuring holding 2 sec each    Quad Sets Limitations  held last rep 30 sec     Bridges  2 sets;10 reps   with heels on red physioball and knee to promote ext     Manual Therapy   Joint Mobilization  grade 3-4 superior patellar mobilizations, and slow creep stretching holding 2 x min pushing patella superiorly    Passive ROM  forced sustained extension combined with active quad set, with tibia externally rotated to promote terminal extension                PT Short Term Goals - 11/27/17 1206      PT SHORT TERM GOAL #1   Title  Patient to be compliant with appropriate HEP, to be updated PRN     Status  Achieved      PT SHORT TERM GOAL #2   Title  Patient to improve L knee AROM to 0-115 degrees in order to improve gross knee ROM and general biomechanics     Status  Unable to assess      PT SHORT TERM GOAL #3   Title  Patient to demonstrate ability to ambulate without assistive device and consistent heel-toe pattern, equal step lengths/stance times, in order to improve general mobility     Status  On-going      PT SHORT TERM GOAL #4   Title  Patient to report R knee pain as being no more than 2/10 in order to show improvement of general condition     Status  On-going        PT Long Term Goals - 11/17/17 1034      PT LONG TERM GOAL #1   Title  Patient to demonstrate MMT as being 5/5 in all tested muscle groups in order to improve gross knee stability and show improved strength     Time  4    Period  Weeks    Status  On-going    Target Date  12/15/17      PT LONG TERM GOAL #2   Title  Patient to be able to reciprocally ascend/descend full flight of stairs with U railing, good eccentric control, and minimal unsteadiness in order to improve access to home and community     Status  Achieved      PT LONG TERM GOAL #3   Title  Patient to be able to complete mock-bowling task with Mod(I) in order to demonstrate strength and balance needed to safely complete this activity independently     Time  4    Period  Weeks    Status  On-going    Target Date  12/15/17      PT LONG TERM GOAL #4   Title  Patient to return to regular exercise programming with personal trainer and no increase in pain in order to asisst in return to PLOF based activities     Time  4  Period  Weeks    Status  On-going    Target Date  12/15/17            Plan - 12/08/17 1145    Clinical Impression Statement  no pain reported today.  Continued working on knee extension ROM which is was able to get 6 degrees today. Also worked on Midwife which he performed well with minimal issues. plan to see pt for 1-2 more visits to finalize HEP.     PT Treatment/Interventions  ADLs/Self Care Home Management;Biofeedback;Cryotherapy;Electrical Stimulation;Iontophoresis 4mg /ml Dexamethasone;Moist Heat;Ultrasound;DME Instruction;Stair training;Functional mobility training;Gait training;Therapeutic activities;Therapeutic exercise;Balance training;Neuromuscular re-education;Patient/family education;Manual techniques;Scar mobilization;Passive range of motion;Dry needling;Taping    PT Next Visit Plan  manual therapy to focus on increasing R knee extension including superior patellar mobs, joint mobs prn, and hamstring stretching; hip, knee, and ankle strengthening, balance with head turns, lunges    PT Home Exercise Plan  continue with HHPT HEP as it remains appropriate, prone knee hangs       Patient will benefit from skilled therapeutic intervention in order to improve the following deficits and impairments:  Abnormal gait, Increased fascial restricitons, Improper body mechanics, Pain, Decreased coordination, Decreased mobility, Decreased scar mobility, Increased muscle spasms, Postural dysfunction, Decreased activity tolerance, Decreased range of motion, Decreased strength, Hypomobility, Decreased balance, Difficulty walking, Impaired flexibility  Visit Diagnosis: Acute pain of right knee  Stiffness of right knee, not elsewhere classified  Localized edema  Muscle weakness (generalized)  Difficulty in walking, not elsewhere classified     Problem List Patient Active Problem List   Diagnosis Date Noted  . Status post total knee replacement, right 10/07/2017  . Unilateral primary osteoarthritis, right knee 07/14/2017  . History of left knee replacement 07/14/2017  . Trigger finger, right middle finger 06/09/2017  .  Carpal tunnel syndrome, left upper limb 06/09/2017  . Status post total left knee replacement 03/28/2017  . Unilateral primary osteoarthritis, left knee 02/19/2017  . Chronic pain of left knee 02/19/2017  . S/P lumbar laminectomy 12/25/2016  . 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 HTN (hypertension)   . Hypercholesteremia   . Hypothyroidism   . PVC's (premature ventricular contractions)    Starr Lake PT, DPT, LAT, ATC  12/08/17  12:43 PM      [facility] [address] [place], [place], [address] Phone: [phone]   Fax:  [phone]  Name: JAE SKEET MRN: [id number] Date of Birth: Jan 28, 1941

## 2017-12-10 ENCOUNTER — Ambulatory Visit: Payer: Medicare Other | Admitting: Physical Therapy

## 2017-12-10 ENCOUNTER — Encounter: Payer: Self-pay | Admitting: Physical Therapy

## 2017-12-10 DIAGNOSIS — M6281 Muscle weakness (generalized): Secondary | ICD-10-CM | POA: Diagnosis not present

## 2017-12-10 DIAGNOSIS — M25561 Pain in right knee: Secondary | ICD-10-CM

## 2017-12-10 DIAGNOSIS — R600 Localized edema: Secondary | ICD-10-CM

## 2017-12-10 DIAGNOSIS — M25661 Stiffness of right knee, not elsewhere classified: Secondary | ICD-10-CM

## 2017-12-10 DIAGNOSIS — R262 Difficulty in walking, not elsewhere classified: Secondary | ICD-10-CM | POA: Diagnosis not present

## 2017-12-10 NOTE — Therapy (Signed)
[place], [place], [address] Phone: [phone]   Fax:  [phone]  Physical Therapy Treatment  Patient Details  Name: Carlos Juarez MRN: [id number] Date of Birth: 04/13/1941 Referring Provider: Mcarthur Rossetti    Encounter Date: 12/10/2017  PT End of Session - 12/10/17 1326    Visit Number  19    Number of Visits  20    Date for PT Re-Evaluation  12/15/17    Authorization Type  Medicare/TRICARE (PN visit 83,  KX visit 8)    PT Start Time  1327    PT Stop Time  1410    PT Time Calculation (min)  43 min    Activity Tolerance  Patient tolerated treatment well    Behavior During Therapy  [facility] for tasks assessed/performed       Past Medical History:  Diagnosis Date  . Arthritis   . Cancer ([place])    skin cancer removed rom nose non melanoma  . HTN (hypertension)   . Hypercholesteremia   . Hypothyroidism   . Pneumonia   . PVC's (premature ventricular contractions)   . Synovial cyst   . Trigger middle finger    right    Past Surgical History:  Procedure Laterality Date  . CARPAL TUNNEL RELEASE    . HERNIA REPAIR     as a child r inguinal  . JOINT REPLACEMENT     TKA Dr. [name] 03/28/17  . LUMBAR LAMINECTOMY/DECOMPRESSION MICRODISCECTOMY N/A 12/25/2016   Procedure: Laminectomy and Foraminotomy - Lumbar two-Lumbar three - Lumbar three-Lumbar four, resection of synovial cyst;  Surgeon: Eustace Moore, MD;  Location: [place];  Service: Neurosurgery;  Laterality: N/A;  . MENISCUS REPAIR Left   . STERIOD INJECTION Right 10/07/2017   Procedure: RIGHT MIDDLE TRIGGER FINGER INJECTION;  Surgeon: Mcarthur Rossetti, MD;  Location: [place];  Service: Orthopedics;  Laterality: Right;  . synovial cyst removed     12/25/16  . TONSILLECTOMY    . TOTAL KNEE ARTHROPLASTY Left 03/28/2017   Procedure: LEFT TOTAL KNEE ARTHROPLASTY;  Surgeon: Mcarthur Rossetti, MD;  [facility];  Service:  Orthopedics;  Laterality: Left;  . TOTAL KNEE ARTHROPLASTY Right 10/07/2017   Procedure: RIGHT TOTAL KNEE ARTHROPLASTY;  Surgeon: Mcarthur Rossetti, MD;  Location: [place];  Service: Orthopedics;  Laterality: Right;    There were no vitals filed for this visit.  Subjective Assessment - 12/10/17 1327    Subjective  "doing well "     Currently in Pain?  No/denies    Pain Score  0-No pain    Pain Frequency  Rarely    Aggravating Factors   N/A    Pain Relieving Factors  exercise                       [facility] Adult PT Treatment/Exercise - 12/10/17 1331      Knee/Hip Exercises: Stretches   Passive Hamstring Stretch  2 reps;Right;30 seconds   with strap   Gastroc Stretch  2 reps;30 seconds      Knee/Hip Exercises: Aerobic   Elliptical  L5 x 6 min elevation L7      Knee/Hip Exercises: Machines for Strengthening   Cybex Knee Extension  2 x going to fatigue bil 25 # concentric bil and eccentric with RLE     Cybex Leg Press  2 x 15 60# bil LE, 2 x 15 concentric bil and eccentric with RLE  Other Machine  heel raise using cybex leg press machine, 1 x 60# going to fatigue bil LE      Knee/Hip Exercises: Standing   Forward Lunges  2 sets;10 reps;Both   touching down on to BOSU for tactile cues   Gait Training  retro walking in // with green band at posterior proximal tibia to promote knee extension           Balance Exercises - 12/10/17 1407      Balance Exercises: Standing   Standing Eyes Opened  1 rep;30 secs;Narrow base of support (BOS)    Standing Eyes Closed  Narrow base of support (BOS);2 reps;30 secs    Tandem Stance  Eyes open;Eyes closed;2 reps;30 secs        PT Education - 12/10/17 1410    Education Details  reviewed balance training and beneftis of doing at home in the corner for safety.     Person(s) Educated  Patient    Methods  Explanation;Verbal cues    Comprehension  Verbalized understanding;Verbal cues required       PT Short Term Goals  - 11/27/17 1206      PT SHORT TERM GOAL #1   Title  Patient to be compliant with appropriate HEP, to be updated PRN     Status  Achieved      PT SHORT TERM GOAL #2   Title  Patient to improve L knee AROM to 0-115 degrees in order to improve gross knee ROM and general biomechanics     Status  Unable to assess      PT SHORT TERM GOAL #3   Title  Patient to demonstrate ability to ambulate without assistive device and consistent heel-toe pattern, equal step lengths/stance times, in order to improve general mobility     Status  On-going      PT SHORT TERM GOAL #4   Title  Patient to report R knee pain as being no more than 2/10 in order to show improvement of general condition     Status  On-going        PT Long Term Goals - 11/17/17 1034      PT LONG TERM GOAL #1   Title  Patient to demonstrate MMT as being 5/5 in all tested muscle groups in order to improve gross knee stability and show improved strength     Time  4    Period  Weeks    Status  On-going    Target Date  12/15/17      PT LONG TERM GOAL #2   Title  Patient to be able to reciprocally ascend/descend full flight of stairs with U railing, good eccentric control, and minimal unsteadiness in order to improve access to home and community     Status  Achieved      PT LONG TERM GOAL #3   Title  Patient to be able to complete mock-bowling task with Mod(I) in order to demonstrate strength and balance needed to safely complete this activity independently     Time  4    Period  Weeks    Status  On-going    Target Date  12/15/17      PT LONG TERM GOAL #4   Title  Patient to return to regular exercise programming with personal trainer and no increase in pain in order to asisst in return to PLOF based activities     Time  4    Period  Weeks  Status  On-going    Target Date  12/15/17            Plan - 12/10/17 1411    Clinical Impression Statement  continued working on hip/ knee strength and balance training  techniques. he continues to report no pain or problems during or following treatment. plan to see pt back in one week due to him being OOT and reassess and likely discharge at time.     PT Next Visit Plan  manual therapy to focus on increasing R knee extension including superior patellar mobs, joint mobs prn, and hamstring stretching; hip, knee, and ankle strengthening, balance with head turns, lunges    PT Home Exercise Plan  continue with HHPT HEP as it remains appropriate, prone knee hangs    Consulted and Agree with Plan of Care  Patient       Patient will benefit from skilled therapeutic intervention in order to improve the following deficits and impairments:  Abnormal gait, Increased fascial restricitons, Improper body mechanics, Pain, Decreased coordination, Decreased mobility, Decreased scar mobility, Increased muscle spasms, Postural dysfunction, Decreased activity tolerance, Decreased range of motion, Decreased strength, Hypomobility, Decreased balance, Difficulty walking, Impaired flexibility  Visit Diagnosis: Acute pain of right knee  Stiffness of right knee, not elsewhere classified  Localized edema  Muscle weakness (generalized)     Problem List Patient Active Problem List   Diagnosis Date Noted  . Status post total knee replacement, right 10/07/2017  . Unilateral primary osteoarthritis, right knee 07/14/2017  . History of left knee replacement 07/14/2017  . Trigger finger, right middle finger 06/09/2017  . Carpal tunnel syndrome, left upper limb 06/09/2017  . Status post total left knee replacement 03/28/2017  . Unilateral primary osteoarthritis, left knee 02/19/2017  . Chronic pain of left knee 02/19/2017  . S/P lumbar laminectomy 12/25/2016  . 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 HTN (hypertension)   . Hypercholesteremia   . Hypothyroidism   . PVC's (premature ventricular contractions)    Starr Lake PT, DPT, LAT, ATC  12/10/17  2:13 PM      [facility] [address] [place], [place], [address] Phone: [phone]   Fax:  [phone]  Name: LYON DUMONT MRN: [id number] Date of Birth: 09-03-1940

## 2017-12-16 ENCOUNTER — Ambulatory Visit: Payer: Medicare Other | Admitting: Physical Therapy

## 2017-12-18 ENCOUNTER — Ambulatory Visit: Payer: Medicare Other | Admitting: Physical Therapy

## 2017-12-22 ENCOUNTER — Ambulatory Visit: Payer: Medicare Other | Attending: Orthopaedic Surgery | Admitting: Physical Therapy

## 2017-12-22 ENCOUNTER — Encounter: Payer: Self-pay | Admitting: Physical Therapy

## 2017-12-22 DIAGNOSIS — R600 Localized edema: Secondary | ICD-10-CM

## 2017-12-22 DIAGNOSIS — M6281 Muscle weakness (generalized): Secondary | ICD-10-CM | POA: Diagnosis not present

## 2017-12-22 DIAGNOSIS — R262 Difficulty in walking, not elsewhere classified: Secondary | ICD-10-CM | POA: Diagnosis not present

## 2017-12-22 DIAGNOSIS — M25561 Pain in right knee: Secondary | ICD-10-CM | POA: Diagnosis not present

## 2017-12-22 DIAGNOSIS — M25661 Stiffness of right knee, not elsewhere classified: Secondary | ICD-10-CM | POA: Diagnosis not present

## 2017-12-22 NOTE — Therapy (Signed)
[place], [place], [address] Phone: [phone]   Fax:  [phone]  Physical Therapy Treatment / Discharge Summary   Patient Details  Name: Carlos Juarez MRN: [id number] Date of Birth: Dec 28, 1940 Referring Provider: Mcarthur Rossetti    Encounter Date: 12/22/2017  PT End of Session - 12/22/17 1215    Visit Number  20    Number of Visits  20    Date for PT Re-Evaluation  12/22/17    PT Start Time  1448    PT Stop Time  1227    PT Time Calculation (min)  39 min    Activity Tolerance  Patient tolerated treatment well    Behavior During Therapy  [facility] for tasks assessed/performed       Past Medical History:  Diagnosis Date  . Arthritis   . Cancer ([place])    skin cancer removed rom nose non melanoma  . HTN (hypertension)   . Hypercholesteremia   . Hypothyroidism   . Pneumonia   . PVC's (premature ventricular contractions)   . Synovial cyst   . Trigger middle finger    right    Past Surgical History:  Procedure Laterality Date  . CARPAL TUNNEL RELEASE    . HERNIA REPAIR     as a child r inguinal  . JOINT REPLACEMENT     TKA Dr. [name] 03/28/17  . LUMBAR LAMINECTOMY/DECOMPRESSION MICRODISCECTOMY N/A 12/25/2016   Procedure: Laminectomy and Foraminotomy - Lumbar two-Lumbar three - Lumbar three-Lumbar four, resection of synovial cyst;  Surgeon: Eustace Moore, MD;  Location: [place];  Service: Neurosurgery;  Laterality: N/A;  . MENISCUS REPAIR Left   . STERIOD INJECTION Right 10/07/2017   Procedure: RIGHT MIDDLE TRIGGER FINGER INJECTION;  Surgeon: Mcarthur Rossetti, MD;  Location: [place];  Service: Orthopedics;  Laterality: Right;  . synovial cyst removed     12/25/16  . TONSILLECTOMY    . TOTAL KNEE ARTHROPLASTY Left 03/28/2017   Procedure: LEFT TOTAL KNEE ARTHROPLASTY;  Surgeon: Mcarthur Rossetti, MD;  [facility];  Service: Orthopedics;  Laterality: Left;  . TOTAL KNEE  ARTHROPLASTY Right 10/07/2017   Procedure: RIGHT TOTAL KNEE ARTHROPLASTY;  Surgeon: Mcarthur Rossetti, MD;  Location: [place];  Service: Orthopedics;  Laterality: Right;    There were no vitals filed for this visit.  Subjective Assessment - 12/22/17 1151    Subjective  "I am doing pretty good, maybe a 1/10 only when I do alot. I bowled Friday and got 198"     Currently in Pain?  No/denies         [facility] PT Assessment - 12/22/17 1153      Observation/Other Assessments   Focus on Therapeutic Outcomes (FOTO)   10% limited      AROM   Right Knee Extension  6    Right Knee Flexion  123      Strength   Right Hip Extension  4/5    Right Hip ABduction  4/5    Right Knee Flexion  5/5    Right Knee Extension  5/5                   [facility] Adult PT Treatment/Exercise - 12/22/17 0001      Knee/Hip Exercises: Stretches   Passive Hamstring Stretch  2 reps;Right;30 seconds    Gastroc Stretch  2 reps;30 seconds   slant board     Knee/Hip Exercises: Aerobic   Elliptical  L5 x 55mn elevation L7      Knee/Hip Exercises: Machines for Strengthening   Cybex Knee Extension  RLE only 1 x 10 20# moving straight into bil concencentric and eccentric with RLE.     Cybex Knee Flexion  2 x 20 25 lbs; double leg concentric, single leg eccentric      Knee/Hip Exercises: Standing   Forward Lunges  2 sets;10 reps;Both          Balance Exercises - 12/22/17 1224      Balance Exercises: Standing   Tandem Stance  Eyes open;Eyes closed;2 reps;30 secs        PT Education - 12/22/17 1226    Education Details  reviewed previously provided HEP, how to progress balance and exercise. benefits of continued exercise.     Person(s) Educated  Patient    Methods  Explanation;Verbal cues    Comprehension  Verbalized understanding;Verbal cues required       PT Short Term Goals - 12/22/17 1212      PT SHORT TERM GOAL #1   Title  Patient to be compliant with appropriate HEP, to be updated  PRN     Time  4    Period  Weeks    Status  Achieved      PT SHORT TERM GOAL #2   Title  Patient to improve L knee AROM to 0-115 degrees in order to improve gross knee ROM and general biomechanics     Baseline  knee extens 6 degrees    Period  Weeks    Status  Partially Met      PT SHORT TERM GOAL #3   Title  Patient to demonstrate ability to ambulate without assistive device and consistent heel-toe pattern, equal step lengths/stance times, in order to improve general mobility     Time  4    Period  Weeks    Status  Achieved      PT SHORT TERM GOAL #4   Title  Patient to report R knee pain as being no more than 2/10 in order to show improvement of general condition     Time  4    Period  Weeks    Status  Achieved        PT Long Term Goals - 12/22/17 1213      PT LONG TERM GOAL #1   Title  Patient to demonstrate MMT as being 5/5 in all tested muscle groups in order to improve gross knee stability and show improved strength     Time  4    Period  Weeks    Status  Achieved      PT LONG TERM GOAL #2   Title  Patient to be able to reciprocally ascend/descend full flight of stairs with U railing, good eccentric control, and minimal unsteadiness in order to improve access to home and community     Time  8    Period  Weeks    Status  Achieved      PT LONG TERM GOAL #3   Title  Patient to be able to complete mock-bowling task with Mod(I) in order to demonstrate strength and balance needed to safely complete this activity independently     Time  4    Status  Achieved      PT LONG TERM GOAL #4   Title  Patient to return to regular exercise programming with personal trainer and no increase in pain in order to asisst in return  to PLOF based activities     Time  4    Period  Weeks    Status  Achieved            Plan - 12/22/17 1224    Clinical Impression Statement  Carlos Juarez has made great progress with physical therapy increaseing both knee ROM and strength. He  continues to demonstrate mild limitation of R knee extension at 6 degrees but demonstrates functional mobility. He met or partially met all goals today and is able to maintain and progress his current level of function independently and will be discharged from PT today.     PT Treatment/Interventions  ADLs/Self Care Home Management;Biofeedback;Cryotherapy;Electrical Stimulation;Iontophoresis 59m/ml Dexamethasone;Moist Heat;Ultrasound;DME Instruction;Stair training;Functional mobility training;Gait training;Therapeutic activities;Therapeutic exercise;Balance training;Neuromuscular re-education;Patient/family education;Manual techniques;Scar mobilization;Passive range of motion;Dry needling;Taping    PT Next Visit Plan  D/C    PT Home Exercise Plan  continue with HHPT HEP as it remains appropriate, prone knee hangs    Consulted and Agree with Plan of Care  Patient       Patient will benefit from skilled therapeutic intervention in order to improve the following deficits and impairments:  Abnormal gait, Increased fascial restricitons, Improper body mechanics, Pain, Decreased coordination, Decreased mobility, Decreased scar mobility, Increased muscle spasms, Postural dysfunction, Decreased activity tolerance, Decreased range of motion, Decreased strength, Hypomobility, Decreased balance, Difficulty walking, Impaired flexibility  Visit Diagnosis: Acute pain of right knee  Stiffness of right knee, not elsewhere classified  Localized edema  Muscle weakness (generalized)  Difficulty in walking, not elsewhere classified     Problem List Patient Active Problem List   Diagnosis Date Noted  . Status post total knee replacement, right 10/07/2017  . Unilateral primary osteoarthritis, right knee 07/14/2017  . History of left knee replacement 07/14/2017  . Trigger finger, right middle finger 06/09/2017  . Carpal tunnel syndrome, left upper limb 06/09/2017  . Status post total left knee replacement  03/28/2017  . Unilateral primary osteoarthritis, left knee 02/19/2017  . Chronic pain of left knee 02/19/2017  . S/P lumbar laminectomy 12/25/2016  . 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 HTN (hypertension)   . Hypercholesteremia   . Hypothyroidism   . PVC's (premature ventricular contractions)     LStarr Lake9/12/2017, 12:30 PM  [facility] [place] [address]Phone: [phone]  Fax:  [phone] Name: Carlos DENOMMEMRN: [id number] of Birth: [birth year]         PHYSICAL THERAPY DISCHARGE SUMMARY  Visits from Start of Care: 20  Current functional level related to goals / functional outcomes: See goals, FOTO 10% limited   Remaining deficits: R knee extension 6 degrees, intermittent soreness with prolonged walking/ standing.    Education / Equipment: HEP, theraband, posture, balance,   Plan: Patient agrees to discharge.  Patient goals were partially met. Patient is being discharged due to being pleased with the current functional level.  ?????        Yusif Gnau PT, DPT, LAT, ATC  12/22/17  12:31 PM

## 2018-01-06 DIAGNOSIS — Z85828 Personal history of other malignant neoplasm of skin: Secondary | ICD-10-CM | POA: Diagnosis not present

## 2018-01-06 DIAGNOSIS — L821 Other seborrheic keratosis: Secondary | ICD-10-CM | POA: Diagnosis not present

## 2018-01-06 DIAGNOSIS — D485 Neoplasm of uncertain behavior of skin: Secondary | ICD-10-CM | POA: Diagnosis not present

## 2018-01-09 ENCOUNTER — Other Ambulatory Visit: Payer: Medicare Other | Admitting: *Deleted

## 2018-01-09 ENCOUNTER — Other Ambulatory Visit: Payer: Self-pay | Admitting: Orthopaedic Surgery

## 2018-01-09 DIAGNOSIS — E785 Hyperlipidemia, unspecified: Secondary | ICD-10-CM

## 2018-01-09 DIAGNOSIS — E782 Mixed hyperlipidemia: Secondary | ICD-10-CM | POA: Diagnosis not present

## 2018-01-09 DIAGNOSIS — I10 Essential (primary) hypertension: Secondary | ICD-10-CM

## 2018-01-09 LAB — COMPREHENSIVE METABOLIC PANEL
ALBUMIN: 3.8 g/dL (ref 3.5–4.8)
ALT: 22 IU/L (ref 0–44)
AST: 23 IU/L (ref 0–40)
Albumin/Globulin Ratio: 1.9 (ref 1.2–2.2)
Alkaline Phosphatase: 72 IU/L (ref 39–117)
BILIRUBIN TOTAL: 1 mg/dL (ref 0.0–1.2)
BUN / CREAT RATIO: 17 (ref 10–24)
BUN: 17 mg/dL (ref 8–27)
CALCIUM: 9.2 mg/dL (ref 8.6–10.2)
CHLORIDE: 98 mmol/L (ref 96–106)
CO2: 25 mmol/L (ref 20–29)
Creatinine, Ser: 1.02 mg/dL (ref 0.76–1.27)
GFR, EST AFRICAN AMERICAN: 82 mL/min/{1.73_m2} (ref >59)
GFR, EST NON AFRICAN AMERICAN: 71 mL/min/{1.73_m2} (ref >59)
GLUCOSE: 103 mg/dL — AB (ref 65–99)
Globulin, Total: 2 g/dL (ref 1.5–4.5)
Potassium: 3.8 mmol/L (ref 3.5–5.2)
Sodium: 139 mmol/L (ref 134–144)
TOTAL PROTEIN: 5.8 g/dL — AB (ref 6.0–8.5)

## 2018-01-09 LAB — LIPID PANEL
Chol/HDL Ratio: 2.3 ratio (ref 0.0–5.0)
Cholesterol, Total: 171 mg/dL (ref 100–199)
HDL: 75 mg/dL (ref >39)
LDL CALC: 78 mg/dL (ref 0–99)
Triglycerides: 91 mg/dL (ref 0–149)
VLDL Cholesterol Cal: 18 mg/dL (ref 5–40)

## 2018-01-14 ENCOUNTER — Ambulatory Visit (INDEPENDENT_AMBULATORY_CARE_PROVIDER_SITE_OTHER): Payer: Medicare Other | Admitting: Cardiovascular Disease

## 2018-01-14 ENCOUNTER — Encounter: Payer: Self-pay | Admitting: Cardiovascular Disease

## 2018-01-14 VITALS — BP 134/72 | HR 53 | Ht 71.0 in | Wt 195.2 lb

## 2018-01-14 DIAGNOSIS — I10 Essential (primary) hypertension: Secondary | ICD-10-CM | POA: Diagnosis not present

## 2018-01-14 DIAGNOSIS — E782 Mixed hyperlipidemia: Secondary | ICD-10-CM

## 2018-01-14 DIAGNOSIS — I493 Ventricular premature depolarization: Secondary | ICD-10-CM | POA: Diagnosis not present

## 2018-01-14 MED ORDER — EZETIMIBE-SIMVASTATIN 10-20 MG PO TABS: 1.0000 | ORAL_TABLET | Freq: Every day | ORAL | 3 refills | Status: DC

## 2018-01-14 NOTE — Patient Instructions (Signed)
Medication Instructions:  Your physician recommends that you continue on your current medications as directed. Please refer to the Current Medication list given to you today.   Labwork: Your physician recommends that you return for lab work in: 1 year on the day of or a few days before your office visit with Dr. Nahser.  You will need to FAST for this appointment - nothing to eat or drink after midnight the night before except water.   Testing/Procedures: None Ordered   Follow-Up: Your physician wants you to follow-up in: 1 year with Dr. Nahser.  You will receive a reminder letter in the mail two months in advance. If you don't receive a letter, please call our office to schedule the follow-up appointment.   If you need a refill on your cardiac medications before your next appointment, please call your pharmacy.   Thank you for choosing [facility]! Amunique Neyra, RN [phone]    

## 2018-01-14 NOTE — Progress Notes (Signed)
CARDIOLOGY OFFICE NOTE  Date:  01/14/2018    Carlos Juarez Date of Birth: 08-Jan-1941 Medical Record #[id number]  PCP:  Dorothyann Peng, NP  Cardiologist:  Former patient of Dr. Sherryl Barters  , now [facility]   Chief Complaint  Patient presents with  . Hyperlipidemia   Problem list 1. Essential hypertension 2. Hyperlipidemia 3. Hypothyroidism   Notes from Truitt Merle.  Carlos Juarez is a 77 y.o. male who presents today for a 4 month check. Former patient of Dr. Mare Ferrari.   He has a history of essential hypertension and a history of hypercholesterolemia. He does not have any history of ischemic heart disease. He had a normal treadmill Cardiolite stress test on 07/24/05 which showed no ischemia and his ejection fraction was 57% with no wall motion abnormalities. He also has a history of hypothyroid disease.   Last seen back in November - cardiac status was felt to be stable. Had had a fall.   Comes back today. Here alone. Remains on NSAID. BP high here today but says he has good control at home. Asking for his Xanax refill - would defer to PCP. He had his labs done earlier this month - those are reviewed. Blood sugar creeping up. He says he is walking most days but knows he needs to lose weight. No chest pain. Breathing is good.   Sept. 21, 2017:  Carlos Juarez is seen today for the first time.  Transfer from [place] .  BP is normal at home.   Is elevated here.  No CP or dyspnea  Works out with a Clinical research associate.  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   July 03, 2016   Doing well from a cardiac standpoint. Had carpal tunnel surgery on his right hand several weeks ago. Seems to be healing up nicely. His blood pressure and heart rate are well controlled.  He's back working out with his trainer on a regular basis. His blood pressure at home is very well-controlled. His blood pressure here today in the office is slightly  elevated. Weight and chol levels have increased    Jan 14 2017: Carlos Juarez is seen today for follow up visit . BP is elevated today here in the office.   BP readings at home look good.  Avoids salt Has started back exercise.   Had back surgery several months ,  Feeling better.  No further hip pain .   Oct. 2, 2019: Doing well from a cardiac standpoint  Has had bilateral knee replacements [name] )   Past Medical History:  Diagnosis Date  . Arthritis   . Cancer ([place])    skin cancer removed rom nose non melanoma  . HTN (hypertension)   . Hypercholesteremia   . Hypothyroidism   . Pneumonia   . PVC's (premature ventricular contractions)   . Synovial cyst   . Trigger middle finger    right    Past Surgical History:  Procedure Laterality Date  . CARPAL TUNNEL RELEASE    . HERNIA REPAIR     as a child r inguinal  . JOINT REPLACEMENT     TKA Dr. [name] 03/28/17  . LUMBAR LAMINECTOMY/DECOMPRESSION MICRODISCECTOMY N/A 12/25/2016   Procedure: Laminectomy and Foraminotomy - Lumbar two-Lumbar three - Lumbar three-Lumbar four, resection of synovial cyst;  Surgeon: Eustace Moore, MD;  Location: [place];  Service: Neurosurgery;  Laterality: N/A;  . MENISCUS REPAIR Left   .  STERIOD INJECTION Right 10/07/2017   Procedure: RIGHT MIDDLE TRIGGER FINGER INJECTION;  Surgeon: Mcarthur Rossetti, MD;  Location: [place];  Service: Orthopedics;  Laterality: Right;  . synovial cyst removed     12/25/16  . TONSILLECTOMY    . TOTAL KNEE ARTHROPLASTY Left 03/28/2017   Procedure: LEFT TOTAL KNEE ARTHROPLASTY;  Surgeon: Mcarthur Rossetti, MD;  [facility];  Service: Orthopedics;  Laterality: Left;  . TOTAL KNEE ARTHROPLASTY Right 10/07/2017   Procedure: RIGHT TOTAL KNEE ARTHROPLASTY;  Surgeon: Mcarthur Rossetti, MD;  Location: [place];  Service: Orthopedics;  Laterality: Right;     Medications: Current Outpatient Medications  Medication Sig Dispense Refill  . atenolol  (TENORMIN) 50 MG tablet Take 50 mg by mouth daily.      Marland Kitchen ezetimibe-simvastatin (VYTORIN) 10-20 MG tablet Take 1 tablet by mouth daily. 90 tablet 3  . levothyroxine (SYNTHROID, LEVOTHROID) 150 MCG tablet Take 150 mcg by mouth daily before breakfast.     . oxymetazoline (AFRIN) 0.05 % nasal spray Place 1 spray into both nostrils 2 (two) times daily as needed for congestion.     No current facility-administered medications for this visit.     Allergies: Allergies  Allergen Reactions  . Epinephrine Other (See Comments)    Light headedness    Social History: The patient  reports that he quit smoking about 43 years ago. He has a 15.00 pack-year smoking history. He has never used smokeless tobacco. He reports that he drinks alcohol. He reports that he does not use drugs.   Family History: The patient's family history includes Heart attack (age of onset: 74) in his father; Heart disease in his father; Hypertension in his mother.   Review of Systems: Please see the history of present illness.   Otherwise, the review of systems is positive for none.   All other systems are reviewed and negative.   Physical Exam: Blood pressure 134/72, pulse (!) 53, height 5\' 11"  (1.803 m), weight 195 lb 3.2 oz (88.5 kg), SpO2 95 %.  GEN:  Well nourished, well developed in no acute distress HEENT: Normal NECK: No JVD; No carotid bruits LYMPHATICS: No lymphadenopathy CARDIAC: RR, no murmurs, rubs, gallops RESPIRATORY:  Clear to auscultation without rales, wheezing or rhonchi  ABDOMEN: Soft, non-tender, non-distended MUSCULOSKELETAL:   S/p bilateral knee replacements ,   Mild swelling of right knww ( replaced in June )   SKIN: Warm and dry NEUROLOGIC:  Alert and oriented x 3    LABORATORY DATA:  EKG:     Lab Results  Component Value Date   WBC 9.8 10/08/2017   HGB 13.8 10/08/2017   HCT 41.2 10/08/2017   PLT 174 10/08/2017   GLUCOSE 103 (H) 01/09/2018   CHOL 171 01/09/2018   TRIG 91 01/09/2018    HDL 75 01/09/2018   LDLDIRECT 103.7 01/03/2012   LDLCALC 78 01/09/2018   ALT 22 01/09/2018   AST 23 01/09/2018   NA 139 01/09/2018   K 3.8 01/09/2018   CL 98 01/09/2018   CREATININE 1.02 01/09/2018   BUN 17 01/09/2018   CO2 25 01/09/2018   TSH 0.58 06/30/2015   INR 0.98 12/25/2016    BNP (last 3 results) No results for input(s): BNP in the last 8760 hours.  ProBNP (last 3 results) No results for input(s): PROBNP in the last 8760 hours.   Other Studies Reviewed Today:   Assessment/Plan: 1. Essential Hypertension:  BP is well controlled.    2. Hypercholesterolemia -  Continue vytorin.   Recent labs look great Recheck in 1 year   4. Hypothyroidism -  On synthroid    The following changes have been made:  See above.  Labs/ tests ordered today include:    Orders Placed This Encounter  Procedures  . Lipid Profile  . Basic Metabolic Panel (BMET)  . Hepatic function panel  . EKG 12-Lead     Mertie Moores, MD  01/14/2018 1:41 PM    [facility] [place],  [place],   [address] Pager [phone] Phone: [phone]; Fax: [phone]

## 2018-02-25 ENCOUNTER — Ambulatory Visit (INDEPENDENT_AMBULATORY_CARE_PROVIDER_SITE_OTHER): Payer: Medicare Other | Admitting: Orthopaedic Surgery

## 2018-02-25 ENCOUNTER — Encounter (INDEPENDENT_AMBULATORY_CARE_PROVIDER_SITE_OTHER): Payer: Self-pay | Admitting: Orthopaedic Surgery

## 2018-02-25 DIAGNOSIS — G5602 Carpal tunnel syndrome, left upper limb: Secondary | ICD-10-CM | POA: Diagnosis not present

## 2018-02-25 NOTE — Progress Notes (Signed)
Office Visit Note   Patient: Carlos Juarez           Date of Birth: 1940-07-12           MRN: [id number] Visit Date: 02/25/2018              Requested by: Dorothyann Peng, NP [place], [place] [address] PCP: Dorothyann Peng, NP   Assessment & Plan: Visit Diagnoses:  1. Carpal tunnel syndrome, left upper limb     Plan: He did wish to try a steroid injection in the transverse carpal ligament near the left hand.  Weeks plan the risks and benefits of this and provided him without difficulty.  I do feel at some point he should consider a carpal tunnel release on the left side.  This is not something he was rushing to and he knows that as well.  We will see him back in 3 months to see how is doing overall but also at that visit to get an AP and lateral of both his knee replacements.  All question concerns were answered and addressed.  Follow-Up Instructions: Return in about 3 months (around 05/28/2018).   Orders:  Orders Placed This Encounter  Procedures  . Hand/UE Inj   No orders of the defined types were placed in this encounter.     Procedures: Hand/UE Inj: L carpal tunnel for carpal tunnel syndrome on 02/25/2018 2:27 PM      Clinical Data: No additional findings.   Subjective: Chief Complaint  Patient presents with  . Left Hand - Pain  The patient comes in today for evaluation treatment of left hand pain and numbness.  He does have a history of carpal tunnel surgery from carpal tunnel syndrome on the right side years ago.  This was done elsewhere.  We do see him for his knee replacements that we performed.  He does wear a night splint on his left wrist and hand and this was bothering him the most.  He wakes up at night with numbness and pain in his left hand.  He denies any neck issues.  He points the median nerve distribution as the main source of his numbness and pain.  HPI  Review of Systems He currently denies any headache, chest pain, shortness  of breath, fever, chills, nausea, vomiting  Objective: Vital Signs: There were no vitals taken for this visit.  Physical Exam He is alert and oriented x3 and in no acute distress Ortho Exam Examination of his left hand does show good pinch and grip strength but he does have numbness in the median nerve distribution.  He has a positive Phalen's and Tinel's exam. Specialty Comments:  No specialty comments available.  Imaging: No results found.   PMFS History: Patient Active Problem List   Diagnosis Date Noted  . Status post total knee replacement, right 10/07/2017  . Unilateral primary osteoarthritis, right knee 07/14/2017  . History of left knee replacement 07/14/2017  . Trigger finger, right middle finger 06/09/2017  . Carpal tunnel syndrome, left upper limb 06/09/2017  . Status post total left knee replacement 03/28/2017  . Unilateral primary osteoarthritis, left knee 02/19/2017  . Chronic pain of left knee 02/19/2017  . S/P lumbar laminectomy 12/25/2016  . 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 HTN (hypertension)   . Hypercholesteremia   . Hypothyroidism   . PVC's (premature ventricular contractions)    Past Medical History:  Diagnosis Date  . Arthritis   . Cancer ([place])  skin cancer removed rom nose non melanoma  . HTN (hypertension)   . Hypercholesteremia   . Hypothyroidism   . Pneumonia   . PVC's (premature ventricular contractions)   . Synovial cyst   . Trigger middle finger    right    Family History  Problem Relation Age of Onset  . Hypertension Mother   . Heart disease Father   . Heart attack Father 7    Past Surgical History:  Procedure Laterality Date  . CARPAL TUNNEL RELEASE    . HERNIA REPAIR     as a child r inguinal  . JOINT REPLACEMENT     TKA Dr. [name] 03/28/17  . LUMBAR LAMINECTOMY/DECOMPRESSION MICRODISCECTOMY N/A 12/25/2016   Procedure: Laminectomy and Foraminotomy - Lumbar two-Lumbar three - Lumbar three-Lumbar four, resection of  synovial cyst;  Surgeon: Eustace Moore, MD;  Location: [place];  Service: Neurosurgery;  Laterality: N/A;  . MENISCUS REPAIR Left   . STERIOD INJECTION Right 10/07/2017   Procedure: RIGHT MIDDLE TRIGGER FINGER INJECTION;  Surgeon: Mcarthur Rossetti, MD;  Location: [place];  Service: Orthopedics;  Laterality: Right;  . synovial cyst removed     12/25/16  . TONSILLECTOMY    . TOTAL KNEE ARTHROPLASTY Left 03/28/2017   Procedure: LEFT TOTAL KNEE ARTHROPLASTY;  Surgeon: Mcarthur Rossetti, MD;  [facility];  Service: Orthopedics;  Laterality: Left;  . TOTAL KNEE ARTHROPLASTY Right 10/07/2017   Procedure: RIGHT TOTAL KNEE ARTHROPLASTY;  Surgeon: Mcarthur Rossetti, MD;  Location: [place];  Service: Orthopedics;  Laterality: Right;   Social History   Occupational History  . Not on file  Tobacco Use  . Smoking status: Former Smoker    Packs/day: 1.00    Years: 15.00    Pack years: 15.00    Last attempt to quit: 07/15/1974    Years since quitting: 43.6  . Smokeless tobacco: Never Used  . Tobacco comment: smoked heavy 15 years and then quit  Substance and Sexual Activity  . Alcohol use: Yes    Alcohol/week: 0.0 standard drinks    Comment: Wine with meals at times   . Drug use: No  . Sexual activity: Yes

## 2018-02-27 ENCOUNTER — Other Ambulatory Visit: Payer: Self-pay

## 2018-05-20 ENCOUNTER — Ambulatory Visit (INDEPENDENT_AMBULATORY_CARE_PROVIDER_SITE_OTHER): Payer: Medicare Other

## 2018-05-20 ENCOUNTER — Telehealth (INDEPENDENT_AMBULATORY_CARE_PROVIDER_SITE_OTHER): Payer: Self-pay | Admitting: Orthopaedic Surgery

## 2018-05-20 ENCOUNTER — Ambulatory Visit (INDEPENDENT_AMBULATORY_CARE_PROVIDER_SITE_OTHER): Payer: Medicare Other | Admitting: Physician Assistant

## 2018-05-20 ENCOUNTER — Ambulatory Visit (INDEPENDENT_AMBULATORY_CARE_PROVIDER_SITE_OTHER): Payer: Self-pay

## 2018-05-20 ENCOUNTER — Encounter (INDEPENDENT_AMBULATORY_CARE_PROVIDER_SITE_OTHER): Payer: Self-pay | Admitting: Physician Assistant

## 2018-05-20 DIAGNOSIS — M65331 Trigger finger, right middle finger: Secondary | ICD-10-CM | POA: Diagnosis not present

## 2018-05-20 DIAGNOSIS — M1711 Unilateral primary osteoarthritis, right knee: Secondary | ICD-10-CM | POA: Diagnosis not present

## 2018-05-20 DIAGNOSIS — G5602 Carpal tunnel syndrome, left upper limb: Secondary | ICD-10-CM | POA: Diagnosis not present

## 2018-05-20 DIAGNOSIS — Z96652 Presence of left artificial knee joint: Secondary | ICD-10-CM

## 2018-05-20 MED ORDER — METHYLPREDNISOLONE ACETATE 40 MG/ML IJ SUSP
40.0000 mg | INTRAMUSCULAR | Status: AC | PRN
  Administered 2018-05-20: 40 mg

## 2018-05-20 MED ORDER — DICLOFENAC SODIUM 1 % TD GEL: 2.0000 g | Freq: Four times a day (QID) | TRANSDERMAL | 1 refills | Status: DC

## 2018-05-20 MED ORDER — LIDOCAINE HCL 1 % IJ SOLN
1.0000 mL | INTRAMUSCULAR | Status: AC | PRN
  Administered 2018-05-20: 1 mL

## 2018-05-20 NOTE — Telephone Encounter (Signed)
Patient called left VM wanted to check on his medication refill

## 2018-05-20 NOTE — Progress Notes (Signed)
Office Visit Note   Patient: Carlos Juarez           Date of Birth: 04-26-1940           MRN: [id number] Visit Date: 05/20/2018              Requested by: Dorothyann Peng, NP [place], [place] [address] PCP: Dorothyann Peng, NP   Assessment & Plan: Visit Diagnoses:  1. Unilateral primary osteoarthritis, right knee   2. Status post total left knee replacement   3. Carpal tunnel syndrome, left upper limb   4. Trigger finger, right middle finger     Plan: Plan follow-up as scheduled in March.  At that time we will discuss possible right hand long finger trigger finger release.  Also discussed possible carpal tunnel release on the left hand however he did need EMG nerve conduction studies prior to this.  He will continue work on quad strengthening both knees.  Questions encouraged and answered.  Follow-Up Instructions: Return for has appointment in March.   Orders:  Orders Placed This Encounter  Procedures  . Hand/UE Inj  . XR Knee 1-2 Views Right  . XR Knee 1-2 Views Left   Meds ordered this encounter  Medications  . diclofenac sodium (VOLTAREN) 1 % GEL    Sig: Apply 2 g topically 4 (four) times daily. Right trigger finger.    Dispense:  1 Tube    Refill:  1      Procedures: Hand/UE Inj: L carpal tunnel for carpal tunnel syndrome on 05/20/2018 2:06 PM Indications: therapeutic Medications: 1 mL lidocaine 1 %; 40 mg methylPREDNISolone acetate 40 MG/ML Consent was given by the patient. Patient was prepped and draped in the usual sterile fashion.       Clinical Data: No additional findings.   Subjective: Chief Complaint  Patient presents with  . Left Hand - Pain, Numbness, Tingling  . Right Knee - Follow-up  . Left Knee - Follow-up    HPI Carlos Juarez returns today complaint numbness tingling in his left hand also notes some swelling in the hand.  States pain is 8 out of 10 as numbness tingling in the median nerve distribution of the hand.   Underwent carpal tunnel release in the past on the right right-handed well.  Never had EMG nerve conduction studies on the left upper extremity.  He is also complaining of left long finger triggering.  He states this developed after carpal tunnel release.  A status post left total knee arthroplasty December 2018 and right knee arthroplasty 10/07/2017.  Both knees are doing well. Review of Systems   Objective: Vital Signs: There were no vitals taken for this visit.  Physical Exam Constitutional:      Appearance: He is not ill-appearing or diaphoretic.  Pulmonary:     Effort: Pulmonary effort is normal.  Neurological:     Mental Status: He is oriented to person, place, and time.     Ortho Exam Right hand triggering of the long finger tender nodule at the A1 pulley.  Left hand positive Tinel's over the median nerve at the wrist.  Decreased sensation subjectively left thumb.  No atrophy ecchymosis erythema of the left hand compared to the right. Specialty Comments:  No specialty comments available.  Imaging: Xr Knee 1-2 Views Left  Result Date: 05/20/2018 Left knee 2 views: Status post left total knee arthroplasty well-seated components.  No acute fractures bony abnormalities.  Xr Knee 1-2 Views Right  Result Date: 05/20/2018 Right knee 2 views: Well-seated total knee arthroplasty components without complication.  No acute fractures.  No bony abnormalities.    PMFS History: Patient Active Problem List   Diagnosis Date Noted  . Status post total knee replacement, right 10/07/2017  . Unilateral primary osteoarthritis, right knee 07/14/2017  . History of left knee replacement 07/14/2017  . Trigger finger, right middle finger 06/09/2017  . Carpal tunnel syndrome, left upper limb 06/09/2017  . Status post total left knee replacement 03/28/2017  . Unilateral primary osteoarthritis, left knee 02/19/2017  . Chronic pain of left knee 02/19/2017  . S/P lumbar laminectomy 12/25/2016  .  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 HTN (hypertension)   . Hypercholesteremia   . Hypothyroidism   . PVC's (premature ventricular contractions)    Past Medical History:  Diagnosis Date  . Arthritis   . Cancer ([place])    skin cancer removed rom nose non melanoma  . HTN (hypertension)   . Hypercholesteremia   . Hypothyroidism   . Pneumonia   . PVC's (premature ventricular contractions)   . Synovial cyst   . Trigger middle finger    right    Family History  Problem Relation Age of Onset  . Hypertension Mother   . Heart disease Father   . Heart attack Father 108    Past Surgical History:  Procedure Laterality Date  . CARPAL TUNNEL RELEASE    . HERNIA REPAIR     as a child r inguinal  . JOINT REPLACEMENT     TKA Dr. [name] 03/28/17  . LUMBAR LAMINECTOMY/DECOMPRESSION MICRODISCECTOMY N/A 12/25/2016   Procedure: Laminectomy and Foraminotomy - Lumbar two-Lumbar three - Lumbar three-Lumbar four, resection of synovial cyst;  Surgeon: Eustace Moore, MD;  Location: [facility];  Service: Neurosurgery;  Laterality: N/A;  . MENISCUS REPAIR Left   . STERIOD INJECTION Right 10/07/2017   Procedure: RIGHT MIDDLE TRIGGER FINGER INJECTION;  Surgeon: Mcarthur Rossetti, MD;  Location: [facility];  Service: Orthopedics;  Laterality: Right;  . synovial cyst removed     12/25/16  . TONSILLECTOMY    . TOTAL KNEE ARTHROPLASTY Left 03/28/2017   Procedure: LEFT TOTAL KNEE ARTHROPLASTY;  Surgeon: Mcarthur Rossetti, MD;  [facility];  Service: Orthopedics;  Laterality: Left;  . TOTAL KNEE ARTHROPLASTY Right 10/07/2017   Procedure: RIGHT TOTAL KNEE ARTHROPLASTY;  Surgeon: Mcarthur Rossetti, MD;  Location: [facility];  Service: Orthopedics;  Laterality: Right;   Social History   Occupational History  . Not on file  Tobacco Use  . Smoking status: Former Smoker    Packs/day: 1.00    Years: 15.00    Pack years: 15.00    Last attempt to quit: 07/15/1974    Years since quitting: 43.8  . Smokeless  tobacco: Never Used  . Tobacco comment: smoked heavy 15 years and then quit  Substance and Sexual Activity  . Alcohol use: Yes    Alcohol/week: 0.0 standard drinks    Comment: Wine with meals at times   . Drug use: No  . Sexual activity: Yes

## 2018-05-20 NOTE — Telephone Encounter (Signed)
Patient states he got his Voltaren gel, pharmacy had just taken awhile to get it/fill it

## 2018-05-27 ENCOUNTER — Ambulatory Visit (INDEPENDENT_AMBULATORY_CARE_PROVIDER_SITE_OTHER): Payer: Medicare Other | Admitting: Orthopaedic Surgery

## 2018-06-24 ENCOUNTER — Other Ambulatory Visit: Payer: Self-pay

## 2018-06-24 ENCOUNTER — Ambulatory Visit (INDEPENDENT_AMBULATORY_CARE_PROVIDER_SITE_OTHER): Payer: Medicare Other | Admitting: Orthopaedic Surgery

## 2018-06-24 ENCOUNTER — Encounter (INDEPENDENT_AMBULATORY_CARE_PROVIDER_SITE_OTHER): Payer: Self-pay | Admitting: Orthopaedic Surgery

## 2018-06-24 DIAGNOSIS — M65331 Trigger finger, right middle finger: Secondary | ICD-10-CM

## 2018-06-24 DIAGNOSIS — Z96653 Presence of artificial knee joint, bilateral: Secondary | ICD-10-CM

## 2018-06-24 MED ORDER — LIDOCAINE HCL 1 % IJ SOLN
0.5000 mL | INTRAMUSCULAR | Status: AC | PRN
  Administered 2018-06-24: .5 mL

## 2018-06-24 MED ORDER — METHYLPREDNISOLONE ACETATE 40 MG/ML IJ SUSP
20.0000 mg | INTRAMUSCULAR | Status: AC | PRN
  Administered 2018-06-24: 20 mg

## 2018-06-24 NOTE — Progress Notes (Signed)
Office Visit Note   Patient: Carlos Juarez           Date of Birth: [birth year]           MRN: [id number] Visit Date: 06/24/2018              Requested by: Dorothyann Peng, NP [place], [place] [address] PCP: Dorothyann Peng, NP   Assessment & Plan: Visit Diagnoses:  1. Trigger finger, right middle finger     Plan: I did place a steroid injection in the A1 pulley of the right middle finger.  We will see him back in about 3 weeks and if that is not improved we will consider an A1 pulley release.  Everything else seems to be doing well with him and all questions concerns were answered and addressed.  Follow-Up Instructions: Return in about 3 weeks (around 07/15/2018).   Orders:  Orders Placed This Encounter  Procedures  . Hand/UE Inj   No orders of the defined types were placed in this encounter.     Procedures: Hand/UE Inj: L long A1 for trigger finger on 06/24/2018 10:27 AM Medications: 0.5 mL lidocaine 1 %; 20 mg methylPREDNISolone acetate 40 MG/ML      Clinical Data: No additional findings.   Subjective: Chief Complaint  Patient presents with  . Right Hand - Follow-up  . Left Wrist - Follow-up  The patient is seen in follow-up.  He is over a year out from a total knee arthroplasty on one knee and left knee from a total knee arthroplasty on his other knee.  He 78 year old.  He got back from a cruise recently with his wife and is doing well.  He did have a carpal tunnel injection on his left wrist a month ago and that is doing well.  He is still having active triggering and pain of his right middle finger.  He says the knees are doing well.  He was very pleased with his carpal tunnel injection as well.  HPI  Review of Systems He currently denies any headache, chest pain, short of breath, fever, chills, nausea, vomiting  Objective: Vital Signs: There were no vitals taken for this visit.  Physical Exam She is alert and orient x3 and in no acute  distress Ortho Exam Examination of his hands shows weak grip strength bilaterally.  His right middle finger does have active triggering with pain over the A1 pulley.  Both knees move well and the patella was tracked well both knees are ligamentously stable. Specialty Comments:  No specialty comments available.  Imaging: No results found.   PMFS History: Patient Active Problem List   Diagnosis Date Noted  . Status post total knee replacement, right 10/07/2017  . Unilateral primary osteoarthritis, right knee 07/14/2017  . History of left knee replacement 07/14/2017  . Trigger finger, right middle finger 06/09/2017  . Carpal tunnel syndrome, left upper limb 06/09/2017  . Status post total left knee replacement 03/28/2017  . Unilateral primary osteoarthritis, left knee 02/19/2017  . Chronic pain of left knee 02/19/2017  . S/P lumbar laminectomy 12/25/2016  . 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 HTN (hypertension)   . Hypercholesteremia   . Hypothyroidism   . PVC's (premature ventricular contractions)    Past Medical History:  Diagnosis Date  . Arthritis   . Cancer ([facility])    skin cancer removed rom nose non melanoma  . HTN (hypertension)   . Hypercholesteremia   . Hypothyroidism   . Pneumonia   .  PVC's (premature ventricular contractions)   . Synovial cyst   . Trigger middle finger    right    Family History  Problem Relation Age of Onset  . Hypertension Mother   . Heart disease Father   . Heart attack Father 80    Past Surgical History:  Procedure Laterality Date  . CARPAL TUNNEL RELEASE    . HERNIA REPAIR     as a child r inguinal  . JOINT REPLACEMENT     TKA Dr. [name] 03/28/17  . LUMBAR LAMINECTOMY/DECOMPRESSION MICRODISCECTOMY N/A 12/25/2016   Procedure: Laminectomy and Foraminotomy - Lumbar two-Lumbar three - Lumbar three-Lumbar four, resection of synovial cyst;  Surgeon: Eustace Moore, MD;  Location: [facility];  Service: Neurosurgery;  Laterality: N/A;  .  MENISCUS REPAIR Left   . STERIOD INJECTION Right 10/07/2017   Procedure: RIGHT MIDDLE TRIGGER FINGER INJECTION;  Surgeon: Mcarthur Rossetti, MD;  Location: [facility];  Service: Orthopedics;  Laterality: Right;  . synovial cyst removed     12/25/16  . TONSILLECTOMY    . TOTAL KNEE ARTHROPLASTY Left 03/28/2017   Procedure: LEFT TOTAL KNEE ARTHROPLASTY;  Surgeon: Mcarthur Rossetti, MD;  [facility];  Service: Orthopedics;  Laterality: Left;  . TOTAL KNEE ARTHROPLASTY Right 10/07/2017   Procedure: RIGHT TOTAL KNEE ARTHROPLASTY;  Surgeon: Mcarthur Rossetti, MD;  Location: [facility];  Service: Orthopedics;  Laterality: Right;   Social History   Occupational History  . Not on file  Tobacco Use  . Smoking status: Former Smoker    Packs/day: 1.00    Years: 15.00    Pack years: 15.00    Last attempt to quit: 07/15/1974    Years since quitting: 43.9  . Smokeless tobacco: Never Used  . Tobacco comment: smoked heavy 15 years and then quit  Substance and Sexual Activity  . Alcohol use: Yes    Alcohol/week: 0.0 standard drinks    Comment: Wine with meals at times   . Drug use: No  . Sexual activity: Yes

## 2018-07-22 ENCOUNTER — Ambulatory Visit (INDEPENDENT_AMBULATORY_CARE_PROVIDER_SITE_OTHER): Payer: Medicare Other | Admitting: Orthopaedic Surgery

## 2018-09-15 ENCOUNTER — Encounter: Payer: Self-pay | Admitting: Orthopaedic Surgery

## 2018-09-15 ENCOUNTER — Other Ambulatory Visit: Payer: Self-pay

## 2018-09-15 ENCOUNTER — Ambulatory Visit (INDEPENDENT_AMBULATORY_CARE_PROVIDER_SITE_OTHER): Payer: Medicare Other | Admitting: Orthopaedic Surgery

## 2018-09-15 DIAGNOSIS — M79642 Pain in left hand: Secondary | ICD-10-CM

## 2018-09-15 DIAGNOSIS — R200 Anesthesia of skin: Secondary | ICD-10-CM

## 2018-09-15 MED ORDER — LIDOCAINE HCL 1 % IJ SOLN
1.0000 mL | INTRAMUSCULAR | Status: AC | PRN
  Administered 2018-09-15: 1 mL

## 2018-09-15 MED ORDER — METHYLPREDNISOLONE ACETATE 40 MG/ML IJ SUSP
40.0000 mg | INTRAMUSCULAR | Status: AC | PRN
  Administered 2018-09-15: 40 mg

## 2018-09-15 NOTE — Progress Notes (Signed)
   Procedure Note  Patient: Carlos Juarez             Date of Birth: 1940/08/04           MRN: [id number]             Visit Date: 09/15/2018  HPI: Carlos Juarez comes in today due to numbness in his left hand.  He continues to have numbness in the left hand that wakes him up at night.  He is wanting the injection which she has had in her left hand in the past and is done well.  He is tried a splint without any real relief.  He is considering surgery but would like to do this later on in the fall.  This point he is had no EMG nerve conduction studies.  Does have a history of right carpal tunnel release some years ago had a another orthopedic practice.  Notes that his left hand numbness tingling is now starting to involve the ring finger which has not been the past.  In regards to his trigger finger right hand involving the middle finger the use of diclofenac gel and the injection definitely is helped his no longer have any active triggering.  Still has some pain in the right middle finger.  Physical exam: Left hand no rashes skin lesions ulcerations.  Full range of motion of the hand.  Subjective decreased sensation throughout the median distribution of the hand.  Right hand middle finger full range of motion without active triggering.  Well-healed surgical incision from carpal tunnel release.    Procedures: Visit Diagnoses: Numbness of left hand   Hand/UE Inj: L carpal tunnel for carpal tunnel syndrome on 09/15/2018 10:31 AM Indications: pain and therapeutic Details: 25 G needle, volar approach Medications: 1 mL lidocaine 1 %; 40 mg methylPREDNISolone acetate 40 MG/ML Consent was given by the patient. Immediately prior to procedure a time out was called to verify the correct patient, procedure, equipment, support staff and site/side marked as required. Patient was prepped and draped in the usual sterile fashion.     Plan: Per his wishes will obtain EMG nerve conduction studies of his left  upper extremity rule out carpal tunnel syndrome mid August.  We will see him back after the study discuss the results and possible surgical intervention.  Questions were encouraged and answered at length.  Patient tolerated carpal tunnel injection today without any apparent adverse effects.

## 2018-10-22 DIAGNOSIS — Z85828 Personal history of other malignant neoplasm of skin: Secondary | ICD-10-CM | POA: Diagnosis not present

## 2018-10-22 DIAGNOSIS — L821 Other seborrheic keratosis: Secondary | ICD-10-CM | POA: Diagnosis not present

## 2018-10-22 DIAGNOSIS — D2261 Melanocytic nevi of right upper limb, including shoulder: Secondary | ICD-10-CM | POA: Diagnosis not present

## 2018-10-22 DIAGNOSIS — D225 Melanocytic nevi of trunk: Secondary | ICD-10-CM | POA: Diagnosis not present

## 2018-10-22 DIAGNOSIS — D1801 Hemangioma of skin and subcutaneous tissue: Secondary | ICD-10-CM | POA: Diagnosis not present

## 2018-10-22 DIAGNOSIS — L570 Actinic keratosis: Secondary | ICD-10-CM | POA: Diagnosis not present

## 2018-10-22 DIAGNOSIS — L720 Epidermal cyst: Secondary | ICD-10-CM | POA: Diagnosis not present

## 2018-10-22 DIAGNOSIS — D2262 Melanocytic nevi of left upper limb, including shoulder: Secondary | ICD-10-CM | POA: Diagnosis not present

## 2018-11-02 DIAGNOSIS — H25813 Combined forms of age-related cataract, bilateral: Secondary | ICD-10-CM | POA: Diagnosis not present

## 2018-11-02 DIAGNOSIS — H0100A Unspecified blepharitis right eye, upper and lower eyelids: Secondary | ICD-10-CM | POA: Diagnosis not present

## 2018-11-02 DIAGNOSIS — H52203 Unspecified astigmatism, bilateral: Secondary | ICD-10-CM | POA: Diagnosis not present

## 2018-11-02 DIAGNOSIS — H43813 Vitreous degeneration, bilateral: Secondary | ICD-10-CM | POA: Diagnosis not present

## 2018-11-05 ENCOUNTER — Other Ambulatory Visit: Payer: Self-pay

## 2018-11-05 ENCOUNTER — Ambulatory Visit (INDEPENDENT_AMBULATORY_CARE_PROVIDER_SITE_OTHER): Payer: Medicare Other | Admitting: Physical Medicine and Rehabilitation

## 2018-11-05 DIAGNOSIS — R202 Paresthesia of skin: Secondary | ICD-10-CM | POA: Diagnosis not present

## 2018-11-05 NOTE — Progress Notes (Signed)
 .  Numeric Pain Rating Scale and Functional Assessment Average Pain 0   In the last MONTH (on 0-10 scale) has pain interfered with the following?  1. General activity like being  able to carry out your everyday physical activities such as walking, climbing stairs, carrying groceries, or moving a chair?  Rating(9)

## 2018-11-06 NOTE — Progress Notes (Signed)
Carlos Juarez - 78 y.o. male MRN [id number]  Date of birth: 10/08/1940  Office Visit Note: Visit Date: 11/05/2018 PCP: Dorothyann Peng, NP Referred by: Dorothyann Peng, NP  Subjective: Chief Complaint  Patient presents with  . Left Hand - Numbness, Tingling   HPI: Carlos Juarez is a 78 y.o. male who comes in today At the request of Dr. Jean Rosenthal for electrodiagnostic studies of the left upper limb.  Patient has a history of pain numbness and tingling in the left hand that has really been going on over a year.  He had worn a brace at times pretty religiously and then had not worn it for a while and then started back with that.  He has had a prior history of right carpal tunnel issue status post carpal tunnel release several years ago.  He has had prior lumbar surgery by Dr. Ronnald Ramp which was laminectomy for facet joint cyst.  He has had no cervical surgery.  His left-sided symptoms are mainly in the thumb index and middle finger.  He has worsening symptoms at night does have to hang his hand down for relief with somewhat of a positive flick sign.  He is right-hand dominant has no symptoms on the right.  He is nondiabetic.  Recent carpal tunnel injection has alleviated the symptoms almost entirely.  ROS Otherwise per HPI.  Assessment & Plan: Visit Diagnoses:  1. 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   There is no significant electrodiagnostic evidence of any other focal nerve entrapment, brachial plexopathy or cervical radiculopathy.   Recommendations: 1.  Follow-up with referring physician. 2.  Continue current management of symptoms. 3.  Continue use of resting splint at night-time and as needed during the day. 4.  Suggest surgical evaluation if relief from injection not lasting very  long.  Meds & Orders: No orders of the defined types were placed in this encounter.   Orders Placed This Encounter  Procedures  . NCV with EMG (electromyography)    Follow-up: Return for Jean Rosenthal, M.D..   Procedures: No procedures performed  EMG & NCV Findings: Evaluation of the left median motor nerve showed prolonged distal onset latency (4.8 ms) and decreased conduction velocity (Elbow-Wrist, 49 m/s).  The left median (across palm) sensory nerve showed prolonged distal peak latency (Wrist, 4.6 ms) and prolonged distal peak latency (Palm, 2.1 ms).  All remaining nerves (as indicated in the following tables) were within normal limits.    All examined muscles (as indicated in the following table) showed no evidence of electrical instability.    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   There is no significant electrodiagnostic evidence of any other focal nerve entrapment, brachial plexopathy or cervical radiculopathy.   Recommendations: 1.  Follow-up with referring physician. 2.  Continue current management of symptoms. 3.  Continue use of resting splint at night-time and as needed during the day. 4.  Suggest surgical evaluation if relief from injection not lasting very long.  ___________________________ [employer] Board Certified, American Board of Physical Medicine and Rehabilitation    Nerve Conduction Studies Anti Sensory Summary Table   Stim Site NR Peak (ms) Norm Peak (ms) P-T Amp (V)  Norm P-T Amp Site1 Site2 Delta-P (ms) Dist (cm) Vel (m/s) Norm Vel (m/s)  Left Median Acr Palm Anti Sensory (2nd Digit)  31.7C  Wrist    *4.6 <3.6 16.1 >10 Wrist Palm 2.5 0.0    Palm    *2.1 <2.0 13.8         Left Radial Anti Sensory (Base 1st Digit)  30.8C  Wrist    2.3 <3.1 19.8  Wrist Base 1st Digit 2.3  0.0    Left Ulnar Anti Sensory (5th Digit)  31.6C  Wrist    3.6 <3.7 18.2 >15.0 Wrist 5th Digit 3.6 14.0 39 >38   Motor Summary Table   Stim Site NR Onset (ms) Norm Onset (ms) O-P Amp (mV) Norm O-P Amp Site1 Site2 Delta-0 (ms) Dist (cm) Vel (m/s) Norm Vel (m/s)  Left Median Motor (Abd Poll Brev)  30.9C  Wrist    *4.8 <4.2 6.3 >5 Elbow Wrist 4.7 23.0 *49 >50  Elbow    9.5  5.9         Left Ulnar Motor (Abd Dig Min)  30.9C  Wrist    3.1 <4.2 8.0 >3 B Elbow Wrist 4.2 24.0 57 >53  B Elbow    7.3  7.7  A Elbow B Elbow 1.5 10.0 67 >53  A Elbow    8.8  7.6          EMG   Side Muscle Nerve Root Ins Act Fibs Psw Amp Dur Poly Recrt Int Fraser Din Comment  Left Abd Poll Brev Median C8-T1 Nml Nml Nml Nml Nml 0 Nml Nml   Left 1stDorInt Ulnar C8-T1 Nml Nml Nml Nml Nml 0 Nml Nml   Left PronatorTeres Median C6-7 Nml Nml Nml Nml Nml 0 Nml Nml   Left Biceps Musculocut C5-6 Nml Nml Nml Nml Nml 0 Nml Nml   Left Deltoid Axillary C5-6 Nml Nml Nml Nml Nml 0 Nml Nml     Nerve Conduction Studies Anti Sensory Left/Right Comparison   Stim Site L Lat (ms) R Lat (ms) L-R Lat (ms) L Amp (V) R Amp (V) L-R Amp (%) Site1 Site2 L Vel (m/s) R Vel (m/s) L-R Vel (m/s)  Median Acr Palm Anti Sensory (2nd Digit)  31.7C  Wrist *4.6   16.1   Wrist Palm     Palm *2.1   13.8         Radial Anti Sensory (Base 1st Digit)  30.8C  Wrist 2.3   19.8   Wrist Base 1st Digit     Ulnar Anti Sensory (5th Digit)  31.6C  Wrist 3.6   18.2   Wrist 5th Digit 39     Motor Left/Right Comparison   Stim Site L Lat (ms) R Lat (ms) L-R Lat (ms) L Amp (mV) R Amp (mV) L-R Amp (%) Site1 Site2 L Vel (m/s) R Vel (m/s) L-R Vel (m/s)  Median Motor (Abd Poll Brev)  30.9C  Wrist *4.8   6.3   Elbow Wrist *49    Elbow 9.5   5.9         Ulnar Motor (Abd Dig Min)  30.9C  Wrist 3.1   8.0   B Elbow Wrist 57    B Elbow 7.3   7.7   A Elbow B Elbow 67    A Elbow 8.8   7.6            Waveforms:            Clinical History: No specialty  comments available.   He reports that he quit smoking about 44 years ago. He has a 15.00 pack-year smoking history. He has never used smokeless tobacco. No results for input(s): HGBA1C, LABURIC in the last 8760 hours.  Objective:  VS:  HT:    WT:   BMI:     BP:   HR: bpm  TEMP: ( )  RESP:  Physical Exam Musculoskeletal:        General: No tenderness.     Comments: Inspection reveals no atrophy of the bilateral APB or FDI or hand intrinsics. There is no swelling, color changes, allodynia or dystrophic changes. There is 5 out of 5 strength in the bilateral wrist extension, finger abduction and long finger flexion. There is intact sensation to light touch in all dermatomal and peripheral nerve distributions. There is a negative Hoffmann's test bilaterally.  Skin:    General: Skin is warm and dry.     Findings: No erythema or rash.  Neurological:     General: No focal deficit present.     Mental Status: He is alert and oriented to person, place, and time.     Sensory: No sensory deficit.     Motor: No weakness or abnormal muscle tone.     Coordination: Coordination normal.     Gait: Gait normal.  Psychiatric:        Mood and Affect: Mood normal.        Behavior: Behavior normal.        Thought Content: Thought content normal.     Ortho Exam Imaging: No results found.  Past Medical/Family/Surgical/Social History: Medications & Allergies reviewed per EMR, new medications updated. Patient Active Problem List   Diagnosis Date Noted  . Status post total knee replacement, right 10/07/2017  . Unilateral primary osteoarthritis, right knee 07/14/2017  . History of left knee replacement 07/14/2017  . Trigger finger, right middle finger 06/09/2017  . Carpal tunnel syndrome, left upper limb 06/09/2017  . Status post total left knee replacement 03/28/2017  . Unilateral primary osteoarthritis, left knee 02/19/2017  . Chronic pain of left knee 02/19/2017  . S/P lumbar laminectomy  12/25/2016  . 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 HTN (hypertension)   . Hypercholesteremia   . Hypothyroidism   . PVC's (premature ventricular contractions)    Past Medical History:  Diagnosis Date  . Arthritis   . Cancer ([place])    skin cancer removed rom nose non melanoma  . HTN (hypertension)   . Hypercholesteremia   . Hypothyroidism   . Pneumonia   . PVC's (premature ventricular contractions)   . Synovial cyst   . Trigger middle finger    right   Family History  Problem Relation Age of Onset  . Hypertension Mother   . Heart disease Father   . Heart attack Father 32   Past Surgical History:  Procedure Laterality Date  . CARPAL TUNNEL RELEASE    . HERNIA REPAIR     as a child r inguinal  . JOINT REPLACEMENT     TKA Dr. [name] 03/28/17  . LUMBAR LAMINECTOMY/DECOMPRESSION MICRODISCECTOMY N/A 12/25/2016   Procedure: Laminectomy and Foraminotomy - Lumbar two-Lumbar three - Lumbar three-Lumbar four, resection of synovial cyst;  Surgeon: Eustace Moore, MD;  Location: [facility];  Service: Neurosurgery;  Laterality: N/A;  . MENISCUS REPAIR Left   . STERIOD INJECTION Right 10/07/2017   Procedure: RIGHT MIDDLE TRIGGER FINGER INJECTION;  Surgeon: Mcarthur Rossetti, MD;  Location: [facility];  Service: Orthopedics;  Laterality: Right;  . synovial cyst removed     12/25/16  . TONSILLECTOMY    . TOTAL KNEE ARTHROPLASTY Left 03/28/2017   Procedure: LEFT TOTAL KNEE ARTHROPLASTY;  Surgeon: Mcarthur Rossetti, MD;  Location[facility];  Service: Orthopedics;  Laterality: Left;  . TOTAL KNEE ARTHROPLASTY Right 10/07/2017   Procedure: RIGHT TOTAL KNEE ARTHROPLASTY;  Surgeon: Mcarthur Rossetti, MD;  Location: [facility];  Service: Orthopedics;  Laterality: Right;   Social History   Occupational History  . Not on file  Tobacco Use  . Smoking status: Former Smoker    Packs/day: 1.00    Years: 15.00    Pack years: 15.00    Quit date: 07/15/1974    Years since quitting: 44.3  .  Smokeless tobacco: Never Used  . Tobacco comment: smoked heavy 15 years and then quit  Substance and Sexual Activity  . Alcohol use: Yes    Alcohol/week: 0.0 standard drinks    Comment: Wine with meals at times   . Drug use: No  . Sexual activity: Yes

## 2018-11-06 NOTE — Procedures (Signed)
EMG & NCV Findings: Evaluation of the left median motor nerve showed prolonged distal onset latency (4.8 ms) and decreased conduction velocity (Elbow-Wrist, 49 m/s).  The left median (across palm) sensory nerve showed prolonged distal peak latency (Wrist, 4.6 ms) and prolonged distal peak latency (Palm, 2.1 ms).  All remaining nerves (as indicated in the following tables) were within normal limits.    All examined muscles (as indicated in the following table) showed no evidence of electrical instability.    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   There is no significant electrodiagnostic evidence of any other focal nerve entrapment, brachial plexopathy or cervical radiculopathy.   Recommendations: 1.  Follow-up with referring physician. 2.  Continue current management of symptoms. 3.  Continue use of resting splint at night-time and as needed during the day. 4.  Suggest surgical evaluation if relief from injection not lasting very long.  ___________________________ [facility] Board Certified, American Board of Physical Medicine and Rehabilitation    Nerve Conduction Studies Anti Sensory Summary Table   Stim Site NR Peak (ms) Norm Peak (ms) P-T Amp (V) Norm P-T Amp Site1 Site2 Delta-P (ms) Dist (cm) Vel (m/s) Norm Vel (m/s)  Left Median Acr Palm Anti Sensory (2nd Digit)  31.7C  Wrist    *4.6 <3.6 16.1 >10 Wrist Palm 2.5 0.0    Palm    *2.1 <2.0 13.8         Left Radial Anti Sensory (Base 1st Digit)  30.8C  Wrist    2.3 <3.1 19.8  Wrist Base 1st Digit 2.3 0.0    Left Ulnar Anti Sensory (5th Digit)  31.6C  Wrist    3.6 <3.7 18.2 >15.0 Wrist 5th Digit 3.6 14.0 39 >38   Motor Summary Table   Stim Site NR Onset (ms) Norm Onset (ms) O-P Amp (mV) Norm O-P Amp Site1 Site2 Delta-0 (ms) Dist (cm) Vel (m/s) Norm Vel (m/s)   Left Median Motor (Abd Poll Brev)  30.9C  Wrist    *4.8 <4.2 6.3 >5 Elbow Wrist 4.7 23.0 *49 >50  Elbow    9.5  5.9         Left Ulnar Motor (Abd Dig Min)  30.9C  Wrist    3.1 <4.2 8.0 >3 B Elbow Wrist 4.2 24.0 57 >53  B Elbow    7.3  7.7  A Elbow B Elbow 1.5 10.0 67 >53  A Elbow    8.8  7.6          EMG   Side Muscle Nerve Root Ins Act Fibs Psw Amp Dur Poly Recrt Int Fraser Din Comment  Left Abd Poll Brev Median C8-T1 Nml Nml Nml Nml Nml 0 Nml Nml   Left 1stDorInt Ulnar C8-T1 Nml Nml Nml Nml Nml 0 Nml Nml   Left PronatorTeres Median C6-7 Nml Nml Nml Nml Nml 0 Nml Nml   Left Biceps Musculocut C5-6 Nml Nml Nml Nml Nml 0 Nml Nml   Left Deltoid Axillary C5-6 Nml Nml Nml Nml Nml 0 Nml Nml     Nerve Conduction Studies Anti Sensory Left/Right Comparison   Stim Site L Lat (ms) R Lat (ms) L-R Lat (ms) L Amp (V) R Amp (V) L-R Amp (%) Site1 Site2 L Vel (m/s) R Vel (m/s) L-R Vel (m/s)  Median Acr Palm Anti Sensory (2nd Digit)  31.7C  Wrist *4.6  16.1   Wrist Palm     Palm *2.1   13.8         Radial Anti Sensory (Base 1st Digit)  30.8C  Wrist 2.3   19.8   Wrist Base 1st Digit     Ulnar Anti Sensory (5th Digit)  31.6C  Wrist 3.6   18.2   Wrist 5th Digit 39     Motor Left/Right Comparison   Stim Site L Lat (ms) R Lat (ms) L-R Lat (ms) L Amp (mV) R Amp (mV) L-R Amp (%) Site1 Site2 L Vel (m/s) R Vel (m/s) L-R Vel (m/s)  Median Motor (Abd Poll Brev)  30.9C  Wrist *4.8   6.3   Elbow Wrist *49    Elbow 9.5   5.9         Ulnar Motor (Abd Dig Min)  30.9C  Wrist 3.1   8.0   B Elbow Wrist 57    B Elbow 7.3   7.7   A Elbow B Elbow 67    A Elbow 8.8   7.6            Waveforms:

## 2018-11-11 ENCOUNTER — Encounter: Payer: Self-pay | Admitting: Orthopaedic Surgery

## 2018-11-11 ENCOUNTER — Other Ambulatory Visit: Payer: Self-pay

## 2018-11-11 ENCOUNTER — Ambulatory Visit (INDEPENDENT_AMBULATORY_CARE_PROVIDER_SITE_OTHER): Payer: Medicare Other | Admitting: Orthopaedic Surgery

## 2018-11-11 DIAGNOSIS — G5602 Carpal tunnel syndrome, left upper limb: Secondary | ICD-10-CM

## 2018-11-11 DIAGNOSIS — M65331 Trigger finger, right middle finger: Secondary | ICD-10-CM | POA: Diagnosis not present

## 2018-11-11 MED ORDER — METHYLPREDNISOLONE ACETATE 40 MG/ML IJ SUSP
20.0000 mg | INTRAMUSCULAR | Status: AC | PRN
  Administered 2018-11-11: 20 mg

## 2018-11-11 MED ORDER — LIDOCAINE HCL 1 % IJ SOLN
0.5000 mL | INTRAMUSCULAR | Status: AC | PRN
  Administered 2018-11-11: .5 mL

## 2018-11-11 NOTE — Progress Notes (Signed)
Office Visit Note   Patient: Carlos Juarez           Date of Birth: 09/30/1940           MRN: [id number] Visit Date: 11/11/2018              Requested by: Dorothyann Peng, NP [place],  [place] [address] PCP: Dorothyann Peng, NP   Assessment & Plan: Visit Diagnoses:  1. Carpal tunnel syndrome, left upper limb   2. Trigger finger, right middle finger     Plan: Given the findings of the nerve conduction studies and his clinical exam we are recommending an open carpal tunnel release on the left side.  Having had this before on his right side he is fully aware of the risk benefits of surgery and why we are recommending this.  I did still describe the surgery in detail as well as the risk and benefits and what it involves from an anatomic standpoint.  I did also place a steroid injection in his right hand middle finger A1 pulley which gave him some relief.  We can always perform a trigger release if needed.  All question concerns were answered addressed.  We will see him 2 weeks after postoperative once we get him set up for surgery.  Follow-Up Instructions: Return for 2 weeks post-op.   Orders:  Orders Placed This Encounter  Procedures   Hand/UE Inj   No orders of the defined types were placed in this encounter.     Procedures: Hand/UE Inj: R long A1 for trigger finger on 11/11/2018 3:11 PM Medications: 0.5 mL lidocaine 1 %; 20 mg methylPREDNISolone acetate 40 MG/ML      Clinical Data: No additional findings.   Subjective: Chief Complaint  Patient presents with   Left Hand - Follow-up  The patient is here today to go over nerve conduction studies that were done the left upper extremity.  He has a history of a right carpal tunnel release and had been developing worsening left hand numbness and tingling.  He also has been having triggering of his right middle finger.  He has had numerous injections by another orthopedic group in that left and trigger  finger on the middle finger but is been a long period time since his last injection.  He says is very painful.  He has getting still continued numbness and tingling in his left upper extremity.  HPI  Review of Systems He currently denies any headache, chest pain, shortness of breath, fever, chills, nausea, vomiting  Objective: Vital Signs: There were no vitals taken for this visit.  Physical Exam He is alert and orient x3 and in no acute distress Ortho Exam Examination of his left hand does show positive Phalen's and Tinel sign.  He has weakness with grip and pinch strength and numbness in the median nerve distribution.  Examination of his right hand does show active triggering of the middle finger with pain over the A1 pulley. Specialty Comments:  No specialty comments available.  Imaging: No results found. The nerve studies that were done here at the office by Dr. Ernestina Patches does show moderate entrapment of the median nerve at the transverse carpal ligament consistent with moderate carpal tunnel syndrome.  PMFS History: Patient Active Problem List   Diagnosis Date Noted   Status post total knee replacement, right 10/07/2017   Unilateral primary osteoarthritis, right knee 07/14/2017   History of left knee replacement 07/14/2017   Trigger finger, right  middle finger 06/09/2017   Carpal tunnel syndrome, left upper limb 06/09/2017   Status post total left knee replacement 03/28/2017   Unilateral primary osteoarthritis, left knee 02/19/2017   Chronic pain of left knee 02/19/2017   S/P lumbar laminectomy 12/25/2016   Cervical spondylosis 08/20/2011   HTN (hypertension)    Hypercholesteremia    Hypothyroidism    PVC's (premature ventricular contractions)    Past Medical History:  Diagnosis Date   Arthritis    Cancer ([place])    skin cancer removed rom nose non melanoma   HTN (hypertension)    Hypercholesteremia    Hypothyroidism    Pneumonia    PVC's  (premature ventricular contractions)    Synovial cyst    Trigger middle finger    right    Family History  Problem Relation Age of Onset   Hypertension Mother    Heart disease Father    Heart attack Father 74    Past Surgical History:  Procedure Laterality Date   CARPAL TUNNEL RELEASE     HERNIA REPAIR     as a child r inguinal   JOINT REPLACEMENT     TKA Dr. [name] 03/28/17   LUMBAR LAMINECTOMY/DECOMPRESSION MICRODISCECTOMY N/A 12/25/2016   Procedure: Laminectomy and Foraminotomy - Lumbar two-Lumbar three - Lumbar three-Lumbar four, resection of synovial cyst;  Surgeon: Eustace Moore, MD;  Location: [facility];  Service: Neurosurgery;  Laterality: N/A;   MENISCUS REPAIR Left    STERIOD INJECTION Right 10/07/2017   Procedure: RIGHT MIDDLE TRIGGER FINGER INJECTION;  Surgeon: Mcarthur Rossetti, MD;  Location: [facility];  Service: Orthopedics;  Laterality: Right;   synovial cyst removed     12/25/16   TONSILLECTOMY     TOTAL KNEE ARTHROPLASTY Left 03/28/2017   Procedure: LEFT TOTAL KNEE ARTHROPLASTY;  Surgeon: Mcarthur Rossetti, MD;  Location: [facility];  Service: Orthopedics;  Laterality: Left;   TOTAL KNEE ARTHROPLASTY Right 10/07/2017   Procedure: RIGHT TOTAL KNEE ARTHROPLASTY;  Surgeon: Mcarthur Rossetti, MD;  Location: [facility];  Service: Orthopedics;  Laterality: Right;   Social History   Occupational History   Not on file  Tobacco Use   Smoking status: Former Smoker    Packs/day: 1.00    Years: 15.00    Pack years: 15.00    Quit date: 07/15/1974    Years since quitting: 44.3   Smokeless tobacco: Never Used   Tobacco comment: smoked heavy 15 years and then quit  Substance and Sexual Activity   Alcohol use: Yes    Alcohol/week: 0.0 standard drinks    Comment: Wine with meals at times    Drug use: No   Sexual activity: Yes

## 2018-11-26 DIAGNOSIS — G5602 Carpal tunnel syndrome, left upper limb: Secondary | ICD-10-CM | POA: Diagnosis not present

## 2018-12-10 ENCOUNTER — Ambulatory Visit (INDEPENDENT_AMBULATORY_CARE_PROVIDER_SITE_OTHER): Payer: Medicare Other | Admitting: Orthopaedic Surgery

## 2018-12-10 ENCOUNTER — Encounter: Payer: Self-pay | Admitting: Orthopaedic Surgery

## 2018-12-10 DIAGNOSIS — G5602 Carpal tunnel syndrome, left upper limb: Secondary | ICD-10-CM

## 2018-12-10 DIAGNOSIS — M65331 Trigger finger, right middle finger: Secondary | ICD-10-CM

## 2018-12-10 NOTE — Progress Notes (Signed)
Carlos Juarez is 2 weeks today status post a left open carpal tunnel release.  He says he is doing well.  We also provide a steroid injection in his right middle finger trigger finger and he said it is barely triggering and is not having any pain.  On examination of his right hand there is some slight pain over the A1 pulley of the middle finger and there is only slight triggering but he looks good overall.  The incision on his left palm looks great summer the surgeon place Steri-Strips.  He can move his fingers and thumb easily.  His fingers are well-perfused.  He has only slight pain and slight numbness and tingling.  This point we will see him back in a month for repeat exam of both hands.  I have recommended Voltaren gel to try over the A1 pulley.  He is someone who is had bilateral knee replacements before as well and I recommend him trying Voltaren gel and where he is hurting on the hamstring and gastroc tendons on both sides.  He is also working with a Physiological scientist.  He is a very active 78 years old.  Again we will see him back in 4 weeks.

## 2019-01-07 ENCOUNTER — Encounter: Payer: Self-pay | Admitting: Orthopaedic Surgery

## 2019-01-07 ENCOUNTER — Ambulatory Visit (INDEPENDENT_AMBULATORY_CARE_PROVIDER_SITE_OTHER): Payer: Medicare Other | Admitting: Orthopaedic Surgery

## 2019-01-07 DIAGNOSIS — G5602 Carpal tunnel syndrome, left upper limb: Secondary | ICD-10-CM

## 2019-01-07 DIAGNOSIS — M65331 Trigger finger, right middle finger: Secondary | ICD-10-CM

## 2019-01-07 NOTE — Progress Notes (Signed)
The patient is now 6 weeks status post a left open carpal tunnel release.  We have also injected his right index finger for triggering.  He says the triggering is gone on the right index finger and he is improving from his carpal tunnel surgery.  He also has a history of bilateral knee replacements.  His right knee has been hurting him some underneath the kneecap and he feels like the knee is slightly swollen.  On examination of his hands there is no active triggering on the right hand.  His left hand incision is healing nicely.  He has improved grip strength in his left hand.  There is still some numbness and he understands as to be expected because of length of time it may take to improve and recover given the severity of his disease and his age of 30.  Examination of both knees feel that they are ligamentously stable.  I agree with the right knee is just slightly larger than the left but there is no significant effusion.  I do feel there is some quad atrophy on the right side but does not on the left side.  I have him work on quad strengthening exercises and I would like to see him back in 6 months.  At that visit I would like an AP and lateral both knees.  If there is any issues before then he will let us know.

## 2019-01-08 ENCOUNTER — Other Ambulatory Visit: Payer: Medicare Other | Admitting: *Deleted

## 2019-01-08 ENCOUNTER — Other Ambulatory Visit: Payer: Self-pay

## 2019-01-08 DIAGNOSIS — E782 Mixed hyperlipidemia: Secondary | ICD-10-CM

## 2019-01-08 LAB — HEPATIC FUNCTION PANEL
ALT: 29 IU/L (ref 0–44)
AST: 26 IU/L (ref 0–40)
Albumin: 3.7 g/dL (ref 3.7–4.7)
Alkaline Phosphatase: 66 IU/L (ref 39–117)
Bilirubin Total: 1.3 mg/dL — ABNORMAL HIGH (ref 0.0–1.2)
Bilirubin, Direct: 0.31 mg/dL (ref 0.00–0.40)
Total Protein: 5.6 g/dL — ABNORMAL LOW (ref 6.0–8.5)

## 2019-01-08 LAB — LIPID PANEL
Chol/HDL Ratio: 2.1 ratio (ref 0.0–5.0)
Cholesterol, Total: 171 mg/dL (ref 100–199)
HDL: 81 mg/dL (ref >39)
LDL Chol Calc (NIH): 72 mg/dL (ref 0–99)
Triglycerides: 99 mg/dL (ref 0–149)
VLDL Cholesterol Cal: 18 mg/dL (ref 5–40)

## 2019-01-08 LAB — BASIC METABOLIC PANEL
BUN/Creatinine Ratio: 15 (ref 10–24)
BUN: 19 mg/dL (ref 8–27)
CO2: 26 mmol/L (ref 20–29)
Calcium: 9.2 mg/dL (ref 8.6–10.2)
Chloride: 100 mmol/L (ref 96–106)
Creatinine, Ser: 1.23 mg/dL (ref 0.76–1.27)
GFR calc Af Amer: 65 mL/min/{1.73_m2} (ref >59)
GFR calc non Af Amer: 56 mL/min/{1.73_m2} — ABNORMAL LOW (ref >59)
Glucose: 109 mg/dL — ABNORMAL HIGH (ref 65–99)
Potassium: 3.9 mmol/L (ref 3.5–5.2)
Sodium: 140 mmol/L (ref 134–144)

## 2019-01-13 ENCOUNTER — Other Ambulatory Visit: Payer: Self-pay | Admitting: Cardiovascular Disease

## 2019-01-14 ENCOUNTER — Ambulatory Visit (INDEPENDENT_AMBULATORY_CARE_PROVIDER_SITE_OTHER): Payer: Medicare Other | Admitting: Cardiovascular Disease

## 2019-01-14 ENCOUNTER — Encounter: Payer: Self-pay | Admitting: Cardiovascular Disease

## 2019-01-14 ENCOUNTER — Telehealth: Payer: Self-pay | Admitting: *Deleted

## 2019-01-14 ENCOUNTER — Other Ambulatory Visit: Payer: Self-pay

## 2019-01-14 VITALS — BP 110/82 | HR 50 | Ht 71.0 in | Wt 195.8 lb

## 2019-01-14 DIAGNOSIS — I10 Essential (primary) hypertension: Secondary | ICD-10-CM

## 2019-01-14 DIAGNOSIS — E782 Mixed hyperlipidemia: Secondary | ICD-10-CM | POA: Diagnosis not present

## 2019-01-14 DIAGNOSIS — I493 Ventricular premature depolarization: Secondary | ICD-10-CM

## 2019-01-14 DIAGNOSIS — Z23 Encounter for immunization: Secondary | ICD-10-CM

## 2019-01-14 NOTE — Telephone Encounter (Signed)
Staff message sent to Nina ok to schedule sleep study. Patient has Medicare and does not require a PA. 

## 2019-01-14 NOTE — Patient Instructions (Signed)

## 2019-01-14 NOTE — Progress Notes (Signed)
CARDIOLOGY OFFICE NOTE  Date:  01/14/2019    [name] Date of Birth: [birth year] Medical Record [id number]  PCP:  Dorothyann Peng, NP  Cardiologist:  Former patient of Dr. Sherryl Barters  , now [place]   Chief Complaint  Patient presents with  . Hypertension   Problem list 1. Essential hypertension 2. Hyperlipidemia 3. Hypothyroidism   Notes from Truitt Merle.  MARTINE SZALAY is a 78 y.o. male who presents today for a 4 month check. Former patient of Dr. Mare Ferrari.   He has a history of essential hypertension and a history of hypercholesterolemia. He does not have any history of ischemic heart disease. He had a normal treadmill Cardiolite stress test on 07/24/05 which showed no ischemia and his ejection fraction was 57% with no wall motion abnormalities. He also has a history of hypothyroid disease.   Last seen back in November - cardiac status was felt to be stable. Had had a fall.   Comes back today. Here alone. Remains on NSAID. BP high here today but says he has good control at home. Asking for his Xanax refill - would defer to PCP. He had his labs done earlier this month - those are reviewed. Blood sugar creeping up. He says he is walking most days but knows he needs to lose weight. No chest pain. Breathing is good.   Sept. 21, 2017:  [name] is seen today for the first time.  Transfer from [facility] .  BP is normal at home.   Is elevated here.  No CP or dyspnea  Works out with a Clinical research associate.  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   July 03, 2016   Doing well from a cardiac standpoint. Had carpal tunnel surgery on his right hand several weeks ago. Seems to be healing up nicely. His blood pressure and heart rate are well controlled.  He's back working out with his trainer on a regular basis. His blood pressure at home is very well-controlled. His blood pressure here today in the office is slightly elevated.  Weight and chol levels have increased    Jan 14 2017: Mr. Genter is seen today for follow up visit . BP is elevated today here in the office.   BP readings at home look good.  Avoids salt Has started back exercise.   Had back surgery several months ,  Feeling better.  No further hip pain .   Oct. 2, 2019: Doing well from a cardiac standpoint  Has had bilateral knee replacements [name] )   Oct. 1, 2020:  Mr Gonya is see for follow up of his hypertension and hyperlipidemia. Doing well Exercises regularly  works out with trainer twice a week, rowing [employer]   Past Medical History:  Diagnosis Date  . Arthritis   . Cancer ([place])    skin cancer removed rom nose non melanoma  . HTN (hypertension)   . Hypercholesteremia   . Hypothyroidism   . Pneumonia   . PVC's (premature ventricular contractions)   . Synovial cyst   . Trigger middle finger    right    Past Surgical History:  Procedure Laterality Date  . CARPAL TUNNEL RELEASE    . HERNIA REPAIR     as a child r inguinal  . JOINT REPLACEMENT     TKA Dr. [name] 03/28/17  . LUMBAR LAMINECTOMY/DECOMPRESSION MICRODISCECTOMY N/A 12/25/2016   Procedure: Laminectomy and Foraminotomy - Lumbar two-Lumbar three -  Lumbar three-Lumbar four, resection of synovial cyst;  Surgeon: Eustace Moore, MD;  Location: [facility];  Service: Neurosurgery;  Laterality: N/A;  . MENISCUS REPAIR Left   . STERIOD INJECTION Right 10/07/2017   Procedure: RIGHT MIDDLE TRIGGER FINGER INJECTION;  Surgeon: Mcarthur Rossetti, MD;  Location: [facility];  Service: Orthopedics;  Laterality: Right;  . synovial cyst removed     12/25/16  . TONSILLECTOMY    . TOTAL KNEE ARTHROPLASTY Left 03/28/2017   Procedure: LEFT TOTAL KNEE ARTHROPLASTY;  Surgeon: Mcarthur Rossetti, MD;  Location[facility];  Service: Orthopedics;  Laterality: Left;  . TOTAL KNEE ARTHROPLASTY Right 10/07/2017   Procedure: RIGHT TOTAL KNEE ARTHROPLASTY;  Surgeon: Mcarthur Rossetti, MD;   Location: [facility];  Service: Orthopedics;  Laterality: Right;     Medications: Current Outpatient Medications  Medication Sig Dispense Refill  . atenolol (TENORMIN) 50 MG tablet Take 50 mg by mouth daily.      . diclofenac sodium (VOLTAREN) 1 % GEL Apply 2 g topically 4 (four) times daily. Right trigger finger. 1 Tube 1  . ezetimibe-simvastatin (VYTORIN) 10-20 MG tablet Take 1 tablet by mouth daily. 90 tablet 3  . levothyroxine (SYNTHROID, LEVOTHROID) 150 MCG tablet Take 150 mcg by mouth daily before breakfast.     . oxymetazoline (AFRIN) 0.05 % nasal spray Place 1 spray into both nostrils 2 (two) times daily as needed for congestion.     No current facility-administered medications for this visit.     Allergies: Allergies  Allergen Reactions  . Epinephrine Other (See Comments)    Light headedness    Social History: The patient  reports that he quit smoking about 44 years ago. He has a 15.00 pack-year smoking history. He has never used smokeless tobacco. He reports current alcohol use. He reports that he does not use drugs.   Family History: The patient's family history includes Heart attack (age of onset: 78) in his father; Heart disease in his father; Hypertension in his mother.   Review of Systems: Please see the history of present illness.   Otherwise, the review of systems is positive for none.   All other systems are reviewed and negative.   Physical Exam: Blood pressure 110/82, pulse (!) 50, height 5\' 11"  (1.803 m), weight 195 lb 12.8 oz (88.8 kg), SpO2 96 %.  GEN:  Well nourished, well developed in no acute distress HEENT: Normal NECK: No JVD; No carotid bruits LYMPHATICS: No lymphadenopathy CARDIAC: RRR , no murmurs, rubs, gallops RESPIRATORY:  Clear to auscultation without rales, wheezing or rhonchi  ABDOMEN: Soft, non-tender, non-distended MUSCULOSKELETAL:  No edema; No deformity  SKIN: Warm and dry NEUROLOGIC:  Alert and oriented x 3   LABORATORY DATA:   EKG:     Oct. 1, 2020 .   Sinus brady at 50 ,  Inc. RBBB   Lab Results  Component Value Date   WBC 9.8 10/08/2017   HGB 13.8 10/08/2017   HCT 41.2 10/08/2017   PLT 174 10/08/2017   GLUCOSE 109 (H) 01/08/2019   CHOL 171 01/08/2019   TRIG 99 01/08/2019   HDL 81 01/08/2019   LDLDIRECT 103.7 01/03/2012   LDLCALC 72 01/08/2019   ALT 29 01/08/2019   AST 26 01/08/2019   NA 140 01/08/2019   K 3.9 01/08/2019   CL 100 01/08/2019   CREATININE 1.23 01/08/2019   BUN 19 01/08/2019   CO2 26 01/08/2019   TSH 0.58 06/30/2015   INR 0.98 12/25/2016  BNP (last 3 results) No results for input(s): BNP in the last 8760 hours.  ProBNP (last 3 results) No results for input(s): PROBNP in the last 8760 hours.   Other Studies Reviewed Today:   Assessment/Plan: 1. Essential Hypertension:  Blood pressure is well controlled.  Continue current medications.  2. Hypercholesterolemia -recent labs look great.  His total cholesterol is 171.  HDL is 81.  LDL is 72.  4. Hypothyroidism -managed by his primary medical doctor   The following changes have been made:  See above.  Labs/ tests ordered today include:    Orders Placed This Encounter  Procedures  . Flu Vaccine QUAD 36+ mos IM     Mertie Moores, MD  01/14/2019 11:00 AM    [facility] [place],  [address], [place]  [id number] Pager [phone] Phone: [phone]; Fax: [phone]

## 2019-01-18 ENCOUNTER — Telehealth: Payer: Self-pay | Admitting: *Deleted

## 2019-01-18 DIAGNOSIS — I10 Essential (primary) hypertension: Secondary | ICD-10-CM

## 2019-01-18 NOTE — Telephone Encounter (Signed)
-----   Message from Lauralee Evener, [place] sent at 01/14/2019  3:38 PM EDT ----- Ok to schedule sleep study. Patient has Medicare and does not require a PA. ----- Message ----- From: Emmaline Life, RN Sent: 01/14/2019  11:21 AM EDT To: [facility] Sleep Studies  Dr. Acie Fredrickson has ordered a sleep study for this patient who has an Epworth Sleepiness Score of 17.  Thank you! Sharyn Lull

## 2019-01-25 NOTE — Telephone Encounter (Signed)
Patient called the sleep lab to cancel his sleep study on 01/22/19, he states he believes this order was a mistake. I spoke to Dr Acie Fredrickson and he said that was ok and they could discuss this matter at his next doctors visit.

## 2019-01-28 ENCOUNTER — Other Ambulatory Visit: Payer: Self-pay

## 2019-01-28 DIAGNOSIS — Z20822 Contact with and (suspected) exposure to covid-19: Secondary | ICD-10-CM

## 2019-01-28 DIAGNOSIS — Z20828 Contact with and (suspected) exposure to other viral communicable diseases: Secondary | ICD-10-CM | POA: Diagnosis not present

## 2019-01-29 ENCOUNTER — Other Ambulatory Visit (HOSPITAL_COMMUNITY): Payer: Medicare Other

## 2019-01-29 LAB — NOVEL CORONAVIRUS, NAA: SARS-CoV-2, NAA: NOT DETECTED

## 2019-02-01 ENCOUNTER — Encounter (HOSPITAL_BASED_OUTPATIENT_CLINIC_OR_DEPARTMENT_OTHER): Payer: Medicare Other | Admitting: Cardiology

## 2019-03-10 ENCOUNTER — Other Ambulatory Visit: Payer: Self-pay

## 2019-04-27 ENCOUNTER — Ambulatory Visit: Payer: Medicare Other | Attending: Internal Medicine

## 2019-04-27 DIAGNOSIS — Z23 Encounter for immunization: Secondary | ICD-10-CM | POA: Diagnosis not present

## 2019-04-27 NOTE — Progress Notes (Signed)
   Covid-19 Vaccination Clinic  Name:  Carlos Juarez    MRN: [id number] DOB: 08-Jul-1940  04/27/2019  [name] was observed post Covid-19 immunization for 15 minutes without incidence. He was provided with Vaccine Information Sheet and instruction to access the V-Safe system.   [name] was instructed to call 911 with any severe reactions post vaccine: Marland Kitchen Difficulty breathing  . Swelling of your face and throat  . A fast heartbeat  . A bad rash all over your body  . Dizziness and weakness    Immunizations Administered    Name Date Dose VIS Date Route   Pfizer COVID-19 Vaccine 04/27/2019  9:40 AM 0.3 mL 03/26/2019 Intramuscular   Manufacturer: [employer], [employer]   Lot: [id number]   [place]: [id number]

## 2019-04-28 ENCOUNTER — Ambulatory Visit: Payer: Self-pay

## 2019-05-17 ENCOUNTER — Ambulatory Visit: Payer: Medicare Other | Attending: Internal Medicine

## 2019-05-17 DIAGNOSIS — Z23 Encounter for immunization: Secondary | ICD-10-CM

## 2019-05-17 NOTE — Progress Notes (Signed)
   Covid-19 Vaccination Clinic  Name:  Carlos Juarez    MRN: [id number] DOB: 09/25/1940  05/17/2019  Carlos Juarez was observed post Covid-19 immunization for 15 minutes without incidence. He was provided with Vaccine Information Sheet and instruction to access the V-Safe system.   Carlos Juarez was instructed to call 911 with any severe reactions post vaccine: Marland Kitchen Difficulty breathing  . Swelling of your face and throat  . A fast heartbeat  . A bad rash all over your body  . Dizziness and weakness    Immunizations Administered    Name Date Dose VIS Date Route   Pfizer COVID-19 Vaccine 05/17/2019 10:27 AM 0.3 mL 03/26/2019 Intramuscular   Manufacturer: [place]   Lot: [id number]   [place]: [id number]

## 2019-07-07 ENCOUNTER — Encounter: Payer: Self-pay | Admitting: Orthopaedic Surgery

## 2019-07-07 ENCOUNTER — Other Ambulatory Visit: Payer: Self-pay

## 2019-07-07 ENCOUNTER — Ambulatory Visit (INDEPENDENT_AMBULATORY_CARE_PROVIDER_SITE_OTHER): Payer: Medicare Other | Admitting: Orthopaedic Surgery

## 2019-07-07 ENCOUNTER — Ambulatory Visit: Payer: Self-pay

## 2019-07-07 ENCOUNTER — Ambulatory Visit (INDEPENDENT_AMBULATORY_CARE_PROVIDER_SITE_OTHER): Payer: Medicare Other

## 2019-07-07 DIAGNOSIS — M65312 Trigger thumb, left thumb: Secondary | ICD-10-CM

## 2019-07-07 DIAGNOSIS — M65331 Trigger finger, right middle finger: Secondary | ICD-10-CM | POA: Diagnosis not present

## 2019-07-07 DIAGNOSIS — Z96653 Presence of artificial knee joint, bilateral: Secondary | ICD-10-CM

## 2019-07-07 MED ORDER — METHYLPREDNISOLONE ACETATE 40 MG/ML IJ SUSP
20.0000 mg | INTRAMUSCULAR | Status: AC | PRN
  Administered 2019-07-07: 20 mg

## 2019-07-07 MED ORDER — METHYLPREDNISOLONE ACETATE 40 MG/ML IJ SUSP
20.0000 mg | INTRAMUSCULAR | Status: AC | PRN
  Administered 2019-07-07: 14:00:00 20 mg

## 2019-07-07 MED ORDER — LIDOCAINE HCL 1 % IJ SOLN
0.5000 mL | INTRAMUSCULAR | Status: AC | PRN
  Administered 2019-07-07: 14:00:00 .5 mL

## 2019-07-07 NOTE — Progress Notes (Signed)
Office Visit Note   Patient: Carlos Juarez           Date of Birth: 1940-11-07           MRN: [id number] Visit Date: 07/07/2019              Requested by: Dorothyann Peng, NP [place],  [place] [address] PCP: Dorothyann Peng, NP   Assessment & Plan: Visit Diagnoses:  1. History of bilateral knee replacement   2. Trigger thumb, left thumb   3. Trigger finger, right middle finger     Plan: I was able to place steroid injections at the A1 pulley of his right middle finger and the A1 pulley of his left thumb which he tolerated well.  This is the second injection I believe or the third possibly of his right middle finger.  Hopefully this will keep this at [place].  If he has recurrence we can talk about A1 pulley release.  He states the last injection lasted him for very long time.  He understands this is the same situation with his left thumb.  From a knee standpoint, follow-up will be as needed at this standpoint since he is doing well.  If there is any issues with his knees he knows also to let us know.  Follow-Up Instructions: Return if symptoms worsen or fail to improve.   Orders:  Orders Placed This Encounter  Procedures  . Hand/UE Inj  . Hand/UE Inj  . XR Knee 1-2 Views Left  . XR Knee 1-2 Views Right   No orders of the defined types were placed in this encounter.     Procedures: Hand/UE Inj: R long A1 for trigger finger on 07/07/2019 2:15 PM Medications: 0.5 mL lidocaine 1 %; 20 mg methylPREDNISolone acetate 40 MG/ML  Hand/UE Inj: L thumb A1 for trigger finger on 07/07/2019 2:15 PM Medications: 0.5 mL lidocaine 1 %; 20 mg methylPREDNISolone acetate 40 MG/ML      Clinical Data: No additional findings.   Subjective: Chief Complaint  Patient presents with  . Left Knee - Follow-up  . Right Knee - Follow-up  Patient comes in today for continued follow-up as a relates to bilateral total knee arthroplasties.  We replaced his left knee in December 2018  and his right knee in June 2019.  He says the knees are doing well and he has good range of motion and strength with his knees.  He said the left one took a little longer to get over the right one.  He denies any instability symptoms of the knees.  He has recently developed some low back pain and pain into the right side.  He also wants me to evaluate his hands because he is having pain and triggering at his right ring finger and his left thumb.  HPI  Review of Systems He currently denies any headache, chest pain, shortness of breath, fever, chills, nausea, vomiting  Objective: Vital Signs: There were no vitals taken for this visit.  Physical Exam He is alert and orient x3 and in no acute distress Ortho Exam Examination of both knees shows well-healed surgical incisions.  Both knees have excellent and full range of motion.  Both knees are ligamentously stable.  Examination of his right hip shows full range of motion with no pain in the groin at all.  His pain seems to be more in the muscles of the oblique muscles and around the top of the iliac crest and into  the right side of his lower back.  He has excellent strength and normal sensation in his bilateral lower extremities with a negative straight leg raise bilaterally.  Examination of both hands shows triggering of the A1 pulley with pain over the right ring finger A1 pulley and over the left thumb A1 pulley with triggering as well as pain. Specialty Comments:  No specialty comments available.  Imaging: XR Knee 1-2 Views Left  Result Date: 07/07/2019 2 views of the left knee show a total knee arthroplasty with no complicating features.  XR Knee 1-2 Views Right  Result Date: 07/07/2019 2 views of the right knee show well-seated total knee arthroplasty with no complicating features.    PMFS History: Patient Active Problem List   Diagnosis Date Noted  . Status post total knee replacement, right 10/07/2017  . Unilateral primary  osteoarthritis, right knee 07/14/2017  . History of left knee replacement 07/14/2017  . Trigger finger, right middle finger 06/09/2017  . Carpal tunnel syndrome, left upper limb 06/09/2017  . Status post total left knee replacement 03/28/2017  . Unilateral primary osteoarthritis, left knee 02/19/2017  . Chronic pain of left knee 02/19/2017  . S/P lumbar laminectomy 12/25/2016  . 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 HTN (hypertension)   . Hypercholesteremia   . Hypothyroidism   . PVC's (premature ventricular contractions)    Past Medical History:  Diagnosis Date  . Arthritis   . Cancer ([facility])    skin cancer removed rom nose non melanoma  . HTN (hypertension)   . Hypercholesteremia   . Hypothyroidism   . Pneumonia   . PVC's (premature ventricular contractions)   . Synovial cyst   . Trigger middle finger    right    Family History  Problem Relation Age of Onset  . Hypertension Mother   . Heart disease Father   . Heart attack Father 51    Past Surgical History:  Procedure Laterality Date  . CARPAL TUNNEL RELEASE    . HERNIA REPAIR     as a child r inguinal  . JOINT REPLACEMENT     TKA Dr. [name] 03/28/17  . LUMBAR LAMINECTOMY/DECOMPRESSION MICRODISCECTOMY N/A 12/25/2016   Procedure: Laminectomy and Foraminotomy - Lumbar two-Lumbar three - Lumbar three-Lumbar four, resection of synovial cyst;  Surgeon: Eustace Moore, MD;  Location: [facility];  Service: Neurosurgery;  Laterality: N/A;  . MENISCUS REPAIR Left   . STERIOD INJECTION Right 10/07/2017   Procedure: RIGHT MIDDLE TRIGGER FINGER INJECTION;  Surgeon: Mcarthur Rossetti, MD;  Location: [facility];  Service: Orthopedics;  Laterality: Right;  . synovial cyst removed     12/25/16  . TONSILLECTOMY    . TOTAL KNEE ARTHROPLASTY Left 03/28/2017   Procedure: LEFT TOTAL KNEE ARTHROPLASTY;  Surgeon: Mcarthur Rossetti, MD;  [facility];  Service: Orthopedics;  Laterality: Left;  . TOTAL KNEE ARTHROPLASTY Right 10/07/2017    Procedure: RIGHT TOTAL KNEE ARTHROPLASTY;  Surgeon: Mcarthur Rossetti, MD;  Location: [facility];  Service: Orthopedics;  Laterality: Right;   Social History   Occupational History  . Not on file  Tobacco Use  . Smoking status: Former Smoker    Packs/day: 1.00    Years: 15.00    Pack years: 15.00    Quit date: 07/15/1974    Years since quitting: 45.0  . Smokeless tobacco: Never Used  . Tobacco comment: smoked heavy 15 years and then quit  Substance and Sexual Activity  . Alcohol use: Yes    Alcohol/week:  0.0 standard drinks    Comment: Wine with meals at times   . Drug use: No  . Sexual activity: Yes

## 2019-08-19 DIAGNOSIS — Z85828 Personal history of other malignant neoplasm of skin: Secondary | ICD-10-CM | POA: Diagnosis not present

## 2019-08-19 DIAGNOSIS — L309 Dermatitis, unspecified: Secondary | ICD-10-CM | POA: Diagnosis not present

## 2019-08-24 DIAGNOSIS — R21 Rash and other nonspecific skin eruption: Secondary | ICD-10-CM | POA: Diagnosis not present

## 2019-08-24 DIAGNOSIS — Z85828 Personal history of other malignant neoplasm of skin: Secondary | ICD-10-CM | POA: Diagnosis not present

## 2019-09-01 DIAGNOSIS — H25811 Combined forms of age-related cataract, right eye: Secondary | ICD-10-CM | POA: Diagnosis not present

## 2019-09-01 DIAGNOSIS — H1131 Conjunctival hemorrhage, right eye: Secondary | ICD-10-CM | POA: Diagnosis not present

## 2019-09-01 DIAGNOSIS — H11441 Conjunctival cysts, right eye: Secondary | ICD-10-CM | POA: Diagnosis not present

## 2019-09-14 IMAGING — DX DG KNEE 1-2V PORT*L*
3 series · 3 of 3 positions shown · non-contrast
Comparison: MRI of the knee 02/22/2013, knee radiograph 02/05/2017

CLINICAL DATA: Status post left knee replacement.

EXAM:
PORTABLE LEFT KNEE - 1-2 VIEW

[knee ap]
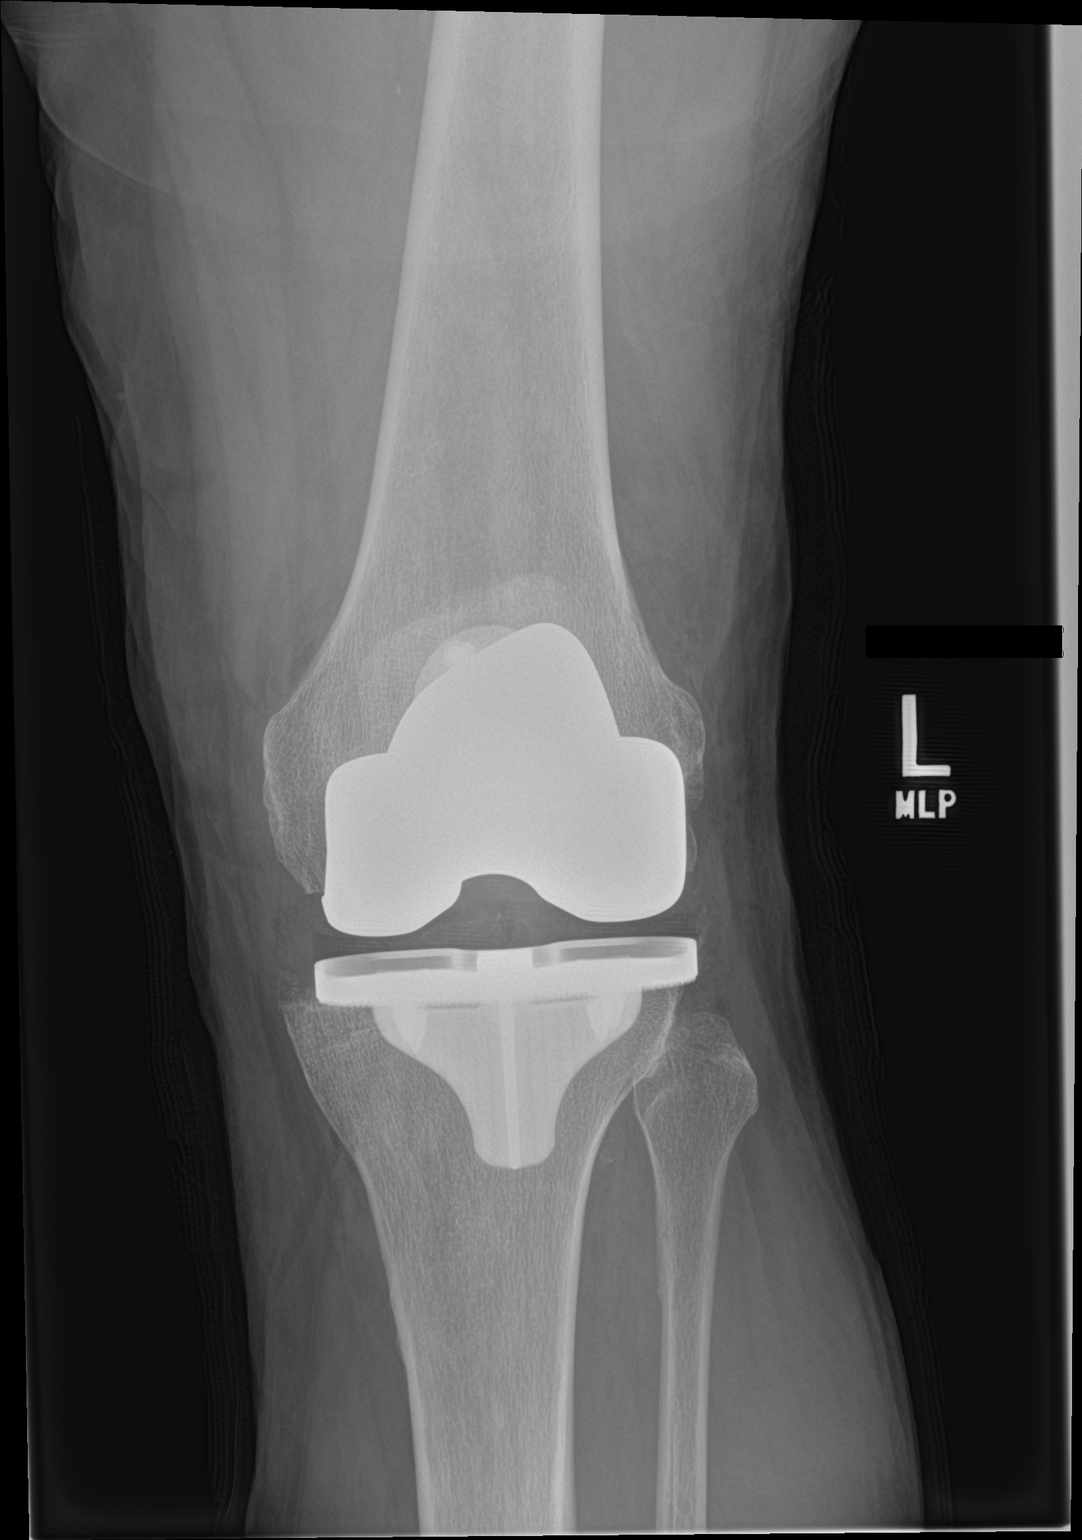

[knee lat (1 of 2)]
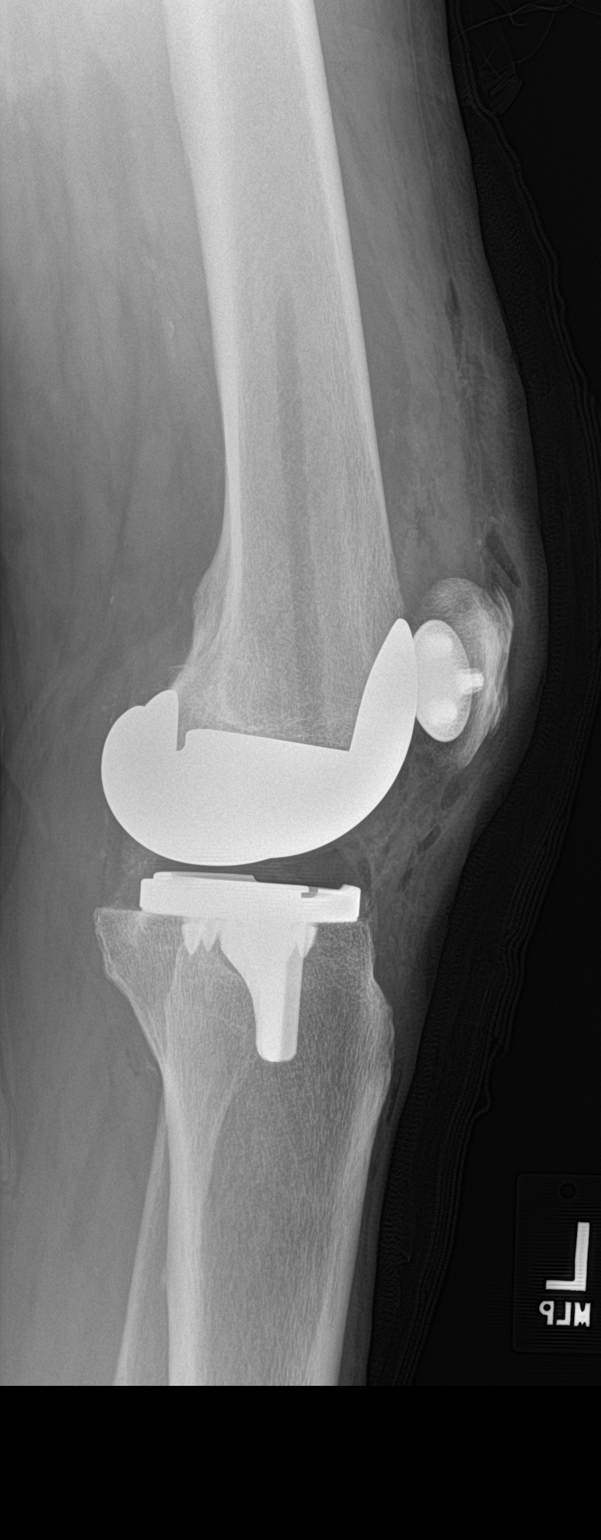

[knee lat (2 of 2)]
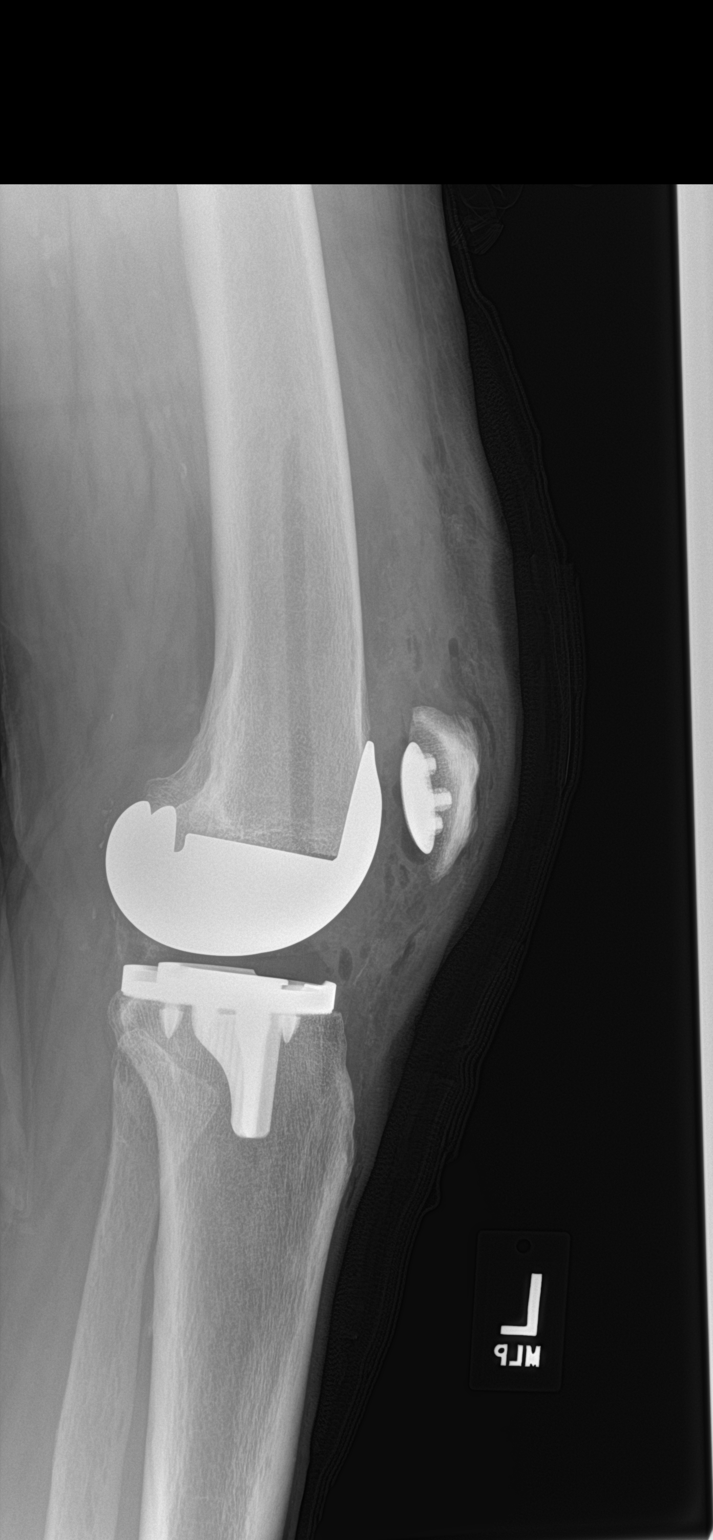

[3 of 3 positions shown; findings below may reference images not displayed]

FINDINGS: Post total left knee arthroplasty with normal alignment of the
orthopedic hardware and no evidence of fracture. Expected
postsurgical soft tissue edema and emphysema.
IMPRESSION: Post recent total left knee arthroplasty without evidence of
immediate complications.

## 2019-11-08 DIAGNOSIS — H52203 Unspecified astigmatism, bilateral: Secondary | ICD-10-CM | POA: Diagnosis not present

## 2019-11-08 DIAGNOSIS — H0100A Unspecified blepharitis right eye, upper and lower eyelids: Secondary | ICD-10-CM | POA: Diagnosis not present

## 2019-11-08 DIAGNOSIS — H25813 Combined forms of age-related cataract, bilateral: Secondary | ICD-10-CM | POA: Diagnosis not present

## 2019-11-08 DIAGNOSIS — H43813 Vitreous degeneration, bilateral: Secondary | ICD-10-CM | POA: Diagnosis not present

## 2019-11-09 ENCOUNTER — Telehealth: Payer: Self-pay | Admitting: Cardiovascular Disease

## 2019-11-09 DIAGNOSIS — I493 Ventricular premature depolarization: Secondary | ICD-10-CM

## 2019-11-09 DIAGNOSIS — I10 Essential (primary) hypertension: Secondary | ICD-10-CM

## 2019-11-09 DIAGNOSIS — E782 Mixed hyperlipidemia: Secondary | ICD-10-CM

## 2019-11-09 NOTE — Telephone Encounter (Signed)
Will forward to Dr Acie Fredrickson to see what labs should be checked

## 2019-11-09 NOTE — Telephone Encounter (Signed)
Patient is requesting orders to have lab work completed prior to appointment scheduled for 12/17/19 with Dr. Acie Fredrickson. Please assist.

## 2019-11-11 NOTE — Telephone Encounter (Signed)
Called patient and scheduled lab appointment for 8/31 for fasting lipid/liver/bmet. Patient verbalized understanding and agreement to come in fasting for labs and thanked me for the call.

## 2019-12-14 ENCOUNTER — Other Ambulatory Visit: Payer: Self-pay

## 2019-12-14 ENCOUNTER — Other Ambulatory Visit: Payer: Medicare Other | Admitting: *Deleted

## 2019-12-14 DIAGNOSIS — I493 Ventricular premature depolarization: Secondary | ICD-10-CM | POA: Diagnosis not present

## 2019-12-14 DIAGNOSIS — E782 Mixed hyperlipidemia: Secondary | ICD-10-CM

## 2019-12-14 DIAGNOSIS — I10 Essential (primary) hypertension: Secondary | ICD-10-CM | POA: Diagnosis not present

## 2019-12-14 LAB — LIPID PANEL
Chol/HDL Ratio: 2.1 ratio (ref 0.0–5.0)
Cholesterol, Total: 179 mg/dL (ref 100–199)
HDL: 86 mg/dL (ref >39)
LDL Chol Calc (NIH): 78 mg/dL (ref 0–99)
Triglycerides: 85 mg/dL (ref 0–149)
VLDL Cholesterol Cal: 15 mg/dL (ref 5–40)

## 2019-12-14 LAB — BASIC METABOLIC PANEL
BUN/Creatinine Ratio: 17 (ref 10–24)
BUN: 19 mg/dL (ref 8–27)
CO2: 23 mmol/L (ref 20–29)
Calcium: 9.1 mg/dL (ref 8.6–10.2)
Chloride: 102 mmol/L (ref 96–106)
Creatinine, Ser: 1.09 mg/dL (ref 0.76–1.27)
GFR calc Af Amer: 75 mL/min/{1.73_m2} (ref >59)
GFR calc non Af Amer: 65 mL/min/{1.73_m2} (ref >59)
Glucose: 101 mg/dL — ABNORMAL HIGH (ref 65–99)
Potassium: 3.6 mmol/L (ref 3.5–5.2)
Sodium: 140 mmol/L (ref 134–144)

## 2019-12-14 LAB — HEPATIC FUNCTION PANEL
ALT: 31 IU/L (ref 0–44)
AST: 28 IU/L (ref 0–40)
Albumin: 4 g/dL (ref 3.7–4.7)
Alkaline Phosphatase: 64 IU/L (ref 48–121)
Bilirubin Total: 1.2 mg/dL (ref 0.0–1.2)
Bilirubin, Direct: 0.3 mg/dL (ref 0.00–0.40)
Total Protein: 5.9 g/dL — ABNORMAL LOW (ref 6.0–8.5)

## 2019-12-14 LAB — SPECIMEN STATUS REPORT

## 2019-12-16 ENCOUNTER — Encounter: Payer: Self-pay | Admitting: Cardiovascular Disease

## 2019-12-16 NOTE — Progress Notes (Signed)
CARDIOLOGY OFFICE NOTE  Date:  12/17/2019    Carlos Juarez Date of Birth: Feb 14, 1941 Medical Record #[id number]  PCP:  Dorothyann Peng, NP  Cardiologist:  Former patient of Dr. Sherryl Barters  , now [place]   Chief Complaint  Patient presents with  . Hyperlipidemia   Problem list 1. Essential hypertension 2. Hyperlipidemia 3. Hypothyroidism   Notes from Truitt Merle.  Carlos Juarez is a 79 y.o. male who presents today for a 4 month check. Former patient of Dr. Mare Ferrari.   He has a history of essential hypertension and a history of hypercholesterolemia. He does not have any history of ischemic heart disease. He had a normal treadmill Cardiolite stress test on 07/24/05 which showed no ischemia and his ejection fraction was 57% with no wall motion abnormalities. He also has a history of hypothyroid disease.   Last seen back in November - cardiac status was felt to be stable. Had had a fall.   Comes back today. Here alone. Remains on NSAID. BP high here today but says he has good control at home. Asking for his Xanax refill - would defer to PCP. He had his labs done earlier this month - those are reviewed. Blood sugar creeping up. He says he is walking most days but knows he needs to lose weight. No chest pain. Breathing is good.   Sept. 21, 2017:  Carlos Juarez is seen today for the first time.  Transfer from [place] .  BP is normal at home.   Is elevated here.  No CP or dyspnea  Works out with a Clinical research associate.  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   July 03, 2016   Doing well from a cardiac standpoint. Had carpal tunnel surgery on his right hand several weeks ago. Seems to be healing up nicely. His blood pressure and heart rate are well controlled.  He's back working out with his trainer on a regular basis. His blood pressure at home is very well-controlled. His blood pressure here today in the office is slightly  elevated. Weight and chol levels have increased    Jan 14 2017: Carlos Juarez is seen today for follow up visit . BP is elevated today here in the office.   BP readings at home look good.  Avoids salt Has started back exercise.   Had back surgery several months ,  Feeling better.  No further hip pain .   Oct. 2, 2019: Doing well from a cardiac standpoint  Has had bilateral knee replacements [name] )   Oct. 1, 2020:  Carlos Juarez is see for follow up of his hypertension and hyperlipidemia. Doing well Exercises regularly  works out with trainer twice a week, rowing machine    Sept. 3, 2021:  Carlos Juarez is seen today for follow up of his HTN, HLD, Still very active BP is typcially well controlled.   Is retired as a Education officer, museum   No cp , no dyspnea  Exercises daily  bp has been well controlled.     Past Medical History:  Diagnosis Date  . Arthritis   . Cancer ([facility])    skin cancer removed rom nose non melanoma  . HTN (hypertension)   . Hypercholesteremia   . Hypothyroidism   . Pneumonia   . PVC's (premature ventricular contractions)   . Synovial cyst   . Trigger middle finger    right    Past Surgical History:  Procedure Laterality Date  . CARPAL TUNNEL RELEASE    . HERNIA REPAIR     as a child r inguinal  . JOINT REPLACEMENT     TKA Dr. [name] 03/28/17  . LUMBAR LAMINECTOMY/DECOMPRESSION MICRODISCECTOMY N/A 12/25/2016   Procedure: Laminectomy and Foraminotomy - Lumbar two-Lumbar three - Lumbar three-Lumbar four, resection of synovial cyst;  Surgeon: Eustace Moore, MD;  Location: [facility];  Service: Neurosurgery;  Laterality: N/A;  . MENISCUS REPAIR Left   . STERIOD INJECTION Right 10/07/2017   Procedure: RIGHT MIDDLE TRIGGER FINGER INJECTION;  Surgeon: Mcarthur Rossetti, MD;  Location: [facility];  Service: Orthopedics;  Laterality: Right;  . synovial cyst removed     12/25/16  . TONSILLECTOMY    . TOTAL KNEE ARTHROPLASTY Left 03/28/2017   Procedure: LEFT  TOTAL KNEE ARTHROPLASTY;  Surgeon: Mcarthur Rossetti, MD;  [facility];  Service: Orthopedics;  Laterality: Left;  . TOTAL KNEE ARTHROPLASTY Right 10/07/2017   Procedure: RIGHT TOTAL KNEE ARTHROPLASTY;  Surgeon: Mcarthur Rossetti, MD;  Location: [facility];  Service: Orthopedics;  Laterality: Right;     Medications: Current Outpatient Medications  Medication Sig Dispense Refill  . atenolol (TENORMIN) 50 MG tablet Take 50 mg by mouth daily.      Marland Kitchen ezetimibe-simvastatin (VYTORIN) 10-20 MG tablet TAKE 1 TABLET DAILY 90 tablet 3  . levothyroxine (SYNTHROID, LEVOTHROID) 150 MCG tablet Take 150 mcg by mouth daily before breakfast.     . oxymetazoline (AFRIN) 0.05 % nasal spray Place 1 spray into both nostrils 2 (two) times daily as needed for congestion.     No current facility-administered medications for this visit.    Allergies: Allergies  Allergen Reactions  . Epinephrine Other (See Comments)    Light headedness    Social History: The patient  reports that he quit smoking about 45 years ago. He has a 15.00 pack-year smoking history. He has never used smokeless tobacco. He reports current alcohol use. He reports that he does not use drugs.   Family History: The patient's family history includes Heart attack (age of onset: 31) in his father; Heart disease in his father; Hypertension in his mother.   Review of Systems: Please see the history of present illness.   Otherwise, the review of systems is positive for none.   All other systems are reviewed and negative.   Physical Exam: Blood pressure (!) 132/92, pulse (!) 52, height 5\' 11"  (1.803 m), weight 195 lb 9.6 oz (88.7 kg), SpO2 95 %.  GEN:  Well nourished, well developed in no acute distress HEENT: Normal NECK: No JVD; No carotid bruits LYMPHATICS: No lymphadenopathy CARDIAC: RRR , no murmurs, rubs, gallops RESPIRATORY:  Clear to auscultation without rales, wheezing or rhonchi  ABDOMEN: Soft, non-tender,  non-distended MUSCULOSKELETAL:  No edema; No deformity  SKIN: Warm and dry NEUROLOGIC:  Alert and oriented x 3    LABORATORY DATA:  EKG:     Lab Results  Component Value Date   WBC 9.8 10/08/2017   HGB 13.8 10/08/2017   HCT 41.2 10/08/2017   PLT 174 10/08/2017   GLUCOSE 101 (H) 12/14/2019   CHOL 179 12/14/2019   TRIG 85 12/14/2019   HDL 86 12/14/2019   LDLDIRECT 103.7 01/03/2012   LDLCALC 78 12/14/2019   ALT 31 12/14/2019   AST 28 12/14/2019   NA 140 12/14/2019   K 3.6 12/14/2019   CL 102 12/14/2019   CREATININE 1.09 12/14/2019   BUN 19 12/14/2019   CO2  23 12/14/2019   TSH 0.58 06/30/2015   INR 0.98 12/25/2016    BNP (last 3 results) No results for input(s): BNP in the last 8760 hours.  ProBNP (last 3 results) No results for input(s): PROBNP in the last 8760 hours.   Other Studies Reviewed Today:   Assessment/Plan: 1. Essential Hypertension:  BP is typically well controlled.  Diastolic BP is a bit elevated today but his readins are good  Continue current medications.  He exercises on a regular basis.   2. Hypercholesterolemia -his last lipid levels look great.  Continue current medications.   4. Hypothyroidism -   Labs/ tests ordered today include:    Orders Placed This Encounter  Procedures  . Hepatic function panel  . Lipid panel  . Basic metabolic panel  . EKG 12-Lead   Will follow up with him in 1 year with lipids, liver enzymes, basic metabolic profile.  Mertie Moores, MD  12/17/2019 11:28 AM    [facility] [place],  [place],   [address] Pager [phone] Phone: [phone]; Fax: [phone]

## 2019-12-17 ENCOUNTER — Ambulatory Visit (INDEPENDENT_AMBULATORY_CARE_PROVIDER_SITE_OTHER): Payer: Medicare Other | Admitting: Cardiovascular Disease

## 2019-12-17 ENCOUNTER — Other Ambulatory Visit: Payer: Self-pay

## 2019-12-17 ENCOUNTER — Encounter: Payer: Self-pay | Admitting: Cardiovascular Disease

## 2019-12-17 VITALS — BP 132/88 | HR 52 | Ht 71.0 in | Wt 195.6 lb

## 2019-12-17 DIAGNOSIS — E782 Mixed hyperlipidemia: Secondary | ICD-10-CM | POA: Diagnosis not present

## 2019-12-17 DIAGNOSIS — E7800 Pure hypercholesterolemia, unspecified: Secondary | ICD-10-CM

## 2019-12-17 DIAGNOSIS — I10 Essential (primary) hypertension: Secondary | ICD-10-CM | POA: Diagnosis not present

## 2019-12-17 NOTE — Patient Instructions (Signed)
Medication Instructions:  Your provider recommends that you continue on your current medications as directed. Please refer to the Current Medication list given to you today.   *If you need a refill on your cardiac medications before your next appointment, please call your pharmacy*  Lab Work: Please have fasting labs drawn prior to your visit in 1 year. If you have labs (blood work) drawn today and your tests are completely normal, you will receive your results only by: Marland Kitchen MyChart Message (if you have MyChart) OR . A paper copy in the mail If you have any lab test that is abnormal or we need to change your treatment, we will call you to review the results.  Follow-Up: At [facility], you and your health needs are our priority.  As part of our continuing mission to provide you with exceptional heart care, we have created designated Provider Care Teams.  These Care Teams include your primary Cardiologist (physician) and Advanced Practice Providers (APPs -  Physician Assistants and Nurse Practitioners) who all work together to provide you with the care you need, when you need it. Your next appointment:   12 month(s) The format for your next appointment:   In Person Provider:   You may see Mertie Moores, MD or one of the following Advanced Practice Providers on your designated Care Team:    Richardson Dopp, PA-C  [place], [place]

## 2019-12-18 ENCOUNTER — Other Ambulatory Visit: Payer: Self-pay | Admitting: Cardiovascular Disease

## 2020-01-10 DIAGNOSIS — Z23 Encounter for immunization: Secondary | ICD-10-CM | POA: Diagnosis not present

## 2020-01-31 ENCOUNTER — Encounter: Payer: Self-pay | Admitting: Physician Assistant

## 2020-01-31 ENCOUNTER — Ambulatory Visit (INDEPENDENT_AMBULATORY_CARE_PROVIDER_SITE_OTHER): Payer: Medicare Other | Admitting: Physician Assistant

## 2020-01-31 ENCOUNTER — Ambulatory Visit: Payer: Self-pay

## 2020-01-31 DIAGNOSIS — S46211A Strain of muscle, fascia and tendon of other parts of biceps, right arm, initial encounter: Secondary | ICD-10-CM | POA: Diagnosis not present

## 2020-01-31 MED ORDER — HYDROCODONE-ACETAMINOPHEN 5-325 MG PO TABS
1.0000 | ORAL_TABLET | Freq: Four times a day (QID) | ORAL | 0 refills | Status: DC | PRN
Start: 2020-01-31 — End: 2020-08-29

## 2020-01-31 MED ORDER — HYDROCODONE-ACETAMINOPHEN 5-325 MG PO TABS
1.0000 | ORAL_TABLET | Freq: Four times a day (QID) | ORAL | 0 refills | Status: DC | PRN
Start: 2020-01-31 — End: 2020-01-31

## 2020-01-31 NOTE — Progress Notes (Signed)
Office Visit Note   Patient: Carlos Juarez           Date of Birth: April 16, 1940           MRN: [id number] Visit Date: 01/31/2020              Requested by: Dorothyann Peng, NP [place],  [place] [address] PCP: Dorothyann Peng, NP   Assessment & Plan: Visit Diagnoses:  1. Rupture of right proximal biceps tendon, initial encounter     Plan: Offered subacromial injection right shoulder he defers.  Therefore recommend conservative measures with relative rest heat and ice.  Discussed with him that there is no surgical intervention for the long head of the biceps rupture of the right arm.  He did ask for some pain medicine they want to take at night this is given.  Follow-Up Instructions: Return if symptoms worsen or fail to improve.   Orders:  Orders Placed This Encounter  Procedures  . XR Shoulder Right   No orders of the defined types were placed in this encounter.     Procedures: No procedures performed   Clinical Data: No additional findings.   Subjective: Chief Complaint  Patient presents with  . Left Shoulder - Pain    HPI Carlos Juarez is a 79 year old male were seen back for right shoulder pain.  He reports that last Thursday or Friday [place] the beach and heard a pop in his right shoulder.  Since that time he has had pain and swelling over the biceps area.  He is having difficulty sleeping due to the pain in the right shoulder.  No particular injury to the right shoulder. Review of Systems See HPI otherwise negative  Objective: Vital Signs: There were no vitals taken for this visit.  Physical Exam General: Well-developed well-nourished male no acute distress mood affect appropriate. Psych: Alert and oriented x3 Ortho Exam Right shoulder he has tenderness over the bicipital groove.  He has ecchymosis with a low riding right biceps muscle belly consistent with proximal biceps tendon rupture.  Distal biceps tendon is intact.  He has full  extension of the elbows bilaterally and full flexion bilateral elbows full supination pronation bilateral forearms. Specialty Comments:  No specialty comments available.  Imaging: No results found.   PMFS History: Patient Active Problem List   Diagnosis Date Noted  . Status post total knee replacement, right 10/07/2017  . Unilateral primary osteoarthritis, right knee 07/14/2017  . History of left knee replacement 07/14/2017  . Trigger finger, right middle finger 06/09/2017  . Carpal tunnel syndrome, left upper limb 06/09/2017  . Status post total left knee replacement 03/28/2017  . Unilateral primary osteoarthritis, left knee 02/19/2017  . Chronic pain of left knee 02/19/2017  . S/P lumbar laminectomy 12/25/2016  . 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 HTN (hypertension)   . Hypercholesteremia   . Hypothyroidism   . PVC's (premature ventricular contractions)    Past Medical History:  Diagnosis Date  . Arthritis   . Cancer ([facility])    skin cancer removed rom nose non melanoma  . HTN (hypertension)   . Hypercholesteremia   . Hypothyroidism   . Pneumonia   . PVC's (premature ventricular contractions)   . Synovial cyst   . Trigger middle finger    right    Family History  Problem Relation Age of Onset  . Hypertension Mother   . Heart disease Father   . Heart attack Father 19  Past Surgical History:  Procedure Laterality Date  . CARPAL TUNNEL RELEASE    . HERNIA REPAIR     as a child r inguinal  . JOINT REPLACEMENT     TKA Dr. [name] 03/28/17  . LUMBAR LAMINECTOMY/DECOMPRESSION MICRODISCECTOMY N/A 12/25/2016   Procedure: Laminectomy and Foraminotomy - Lumbar two-Lumbar three - Lumbar three-Lumbar four, resection of synovial cyst;  Surgeon: Eustace Moore, MD;  Location: [facility];  Service: Neurosurgery;  Laterality: N/A;  . MENISCUS REPAIR Left   . STERIOD INJECTION Right 10/07/2017   Procedure: RIGHT MIDDLE TRIGGER FINGER INJECTION;  Surgeon: Mcarthur Rossetti, MD;   Location: [facility];  Service: Orthopedics;  Laterality: Right;  . synovial cyst removed     12/25/16  . TONSILLECTOMY    . TOTAL KNEE ARTHROPLASTY Left 03/28/2017   Procedure: LEFT TOTAL KNEE ARTHROPLASTY;  Surgeon: Mcarthur Rossetti, MD;  [facility];  Service: Orthopedics;  Laterality: Left;  . TOTAL KNEE ARTHROPLASTY Right 10/07/2017   Procedure: RIGHT TOTAL KNEE ARTHROPLASTY;  Surgeon: Mcarthur Rossetti, MD;  Location: [facility];  Service: Orthopedics;  Laterality: Right;   Social History   Occupational History  . Not on file  Tobacco Use  . Smoking status: Former Smoker    Packs/day: 1.00    Years: 15.00    Pack years: 15.00    Quit date: 07/15/1974    Years since quitting: 45.5  . Smokeless tobacco: Never Used  . Tobacco comment: smoked heavy 15 years and then quit  Vaping Use  . Vaping Use: Never used  Substance and Sexual Activity  . Alcohol use: Yes    Alcohol/week: 0.0 standard drinks    Comment: Wine with meals at times   . Drug use: No  . Sexual activity: Yes

## 2020-02-04 DIAGNOSIS — Z23 Encounter for immunization: Secondary | ICD-10-CM | POA: Diagnosis not present

## 2020-03-25 IMAGING — DX DG KNEE 1-2V PORT*R*
1 series · 2 of 2 positions shown · non-contrast
Comparison: None.

CLINICAL DATA: Status post right knee replacement

EXAM:
PORTABLE RIGHT KNEE - 1-2 VIEW

[Series 1: knee · 0.14mm/px · 2 of 2 slices shown]
[im 1/2]
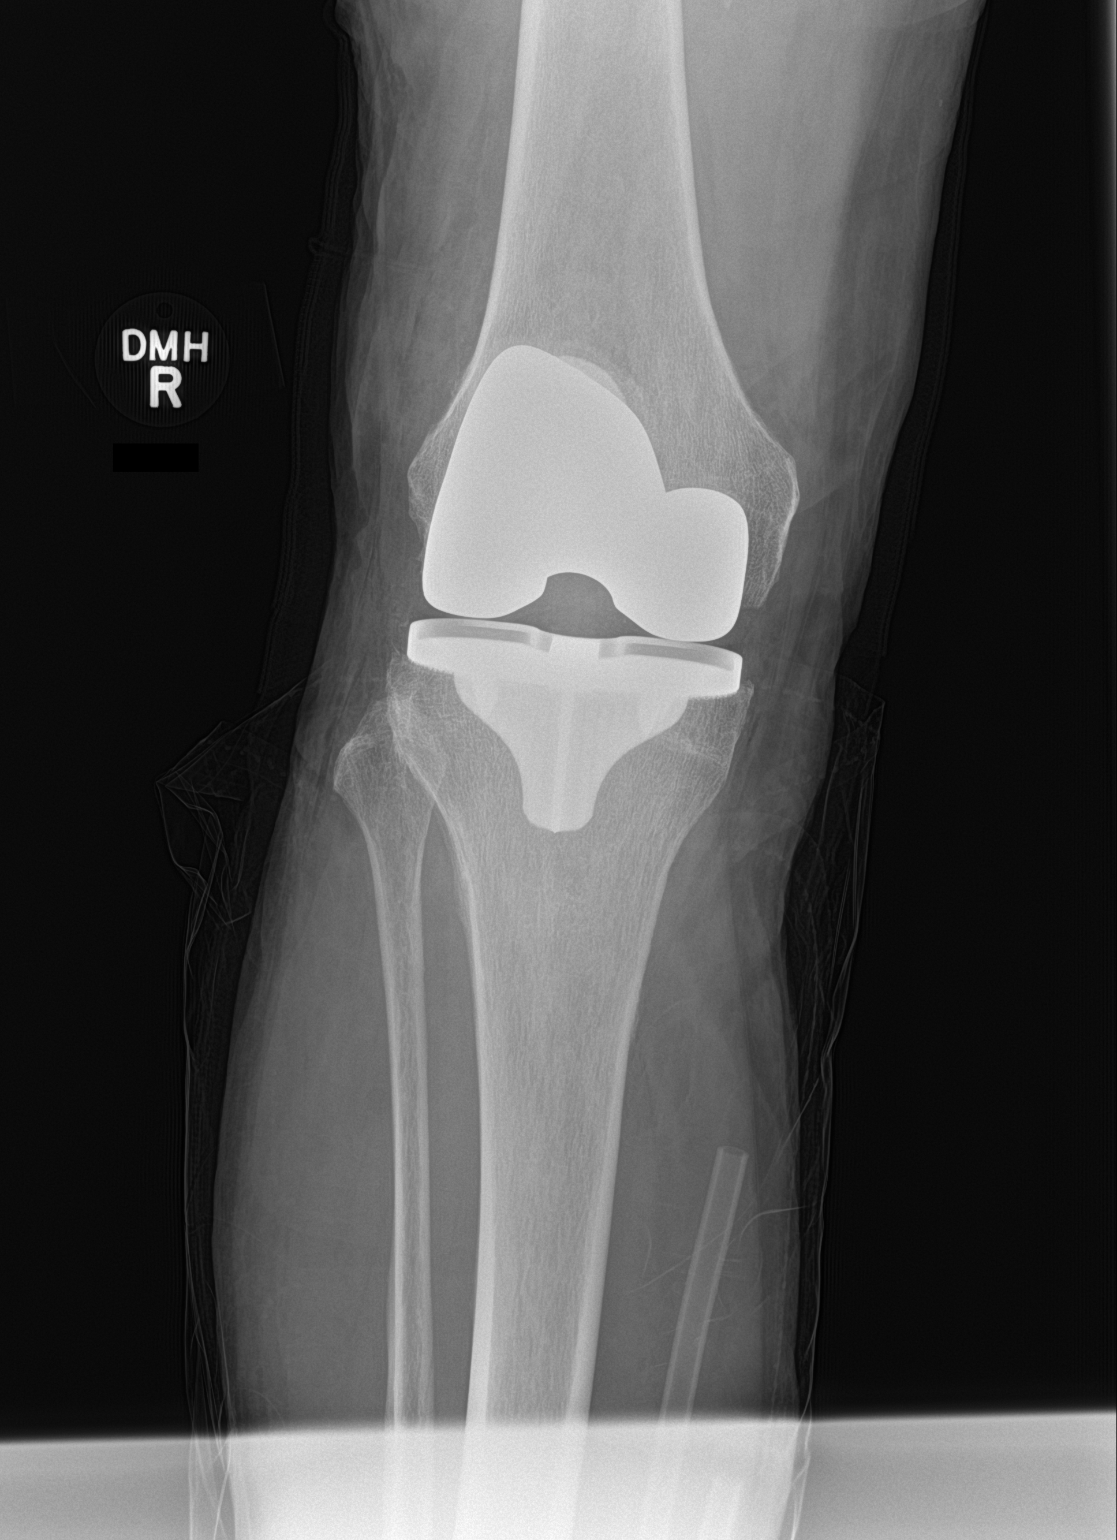
[im 2/2]
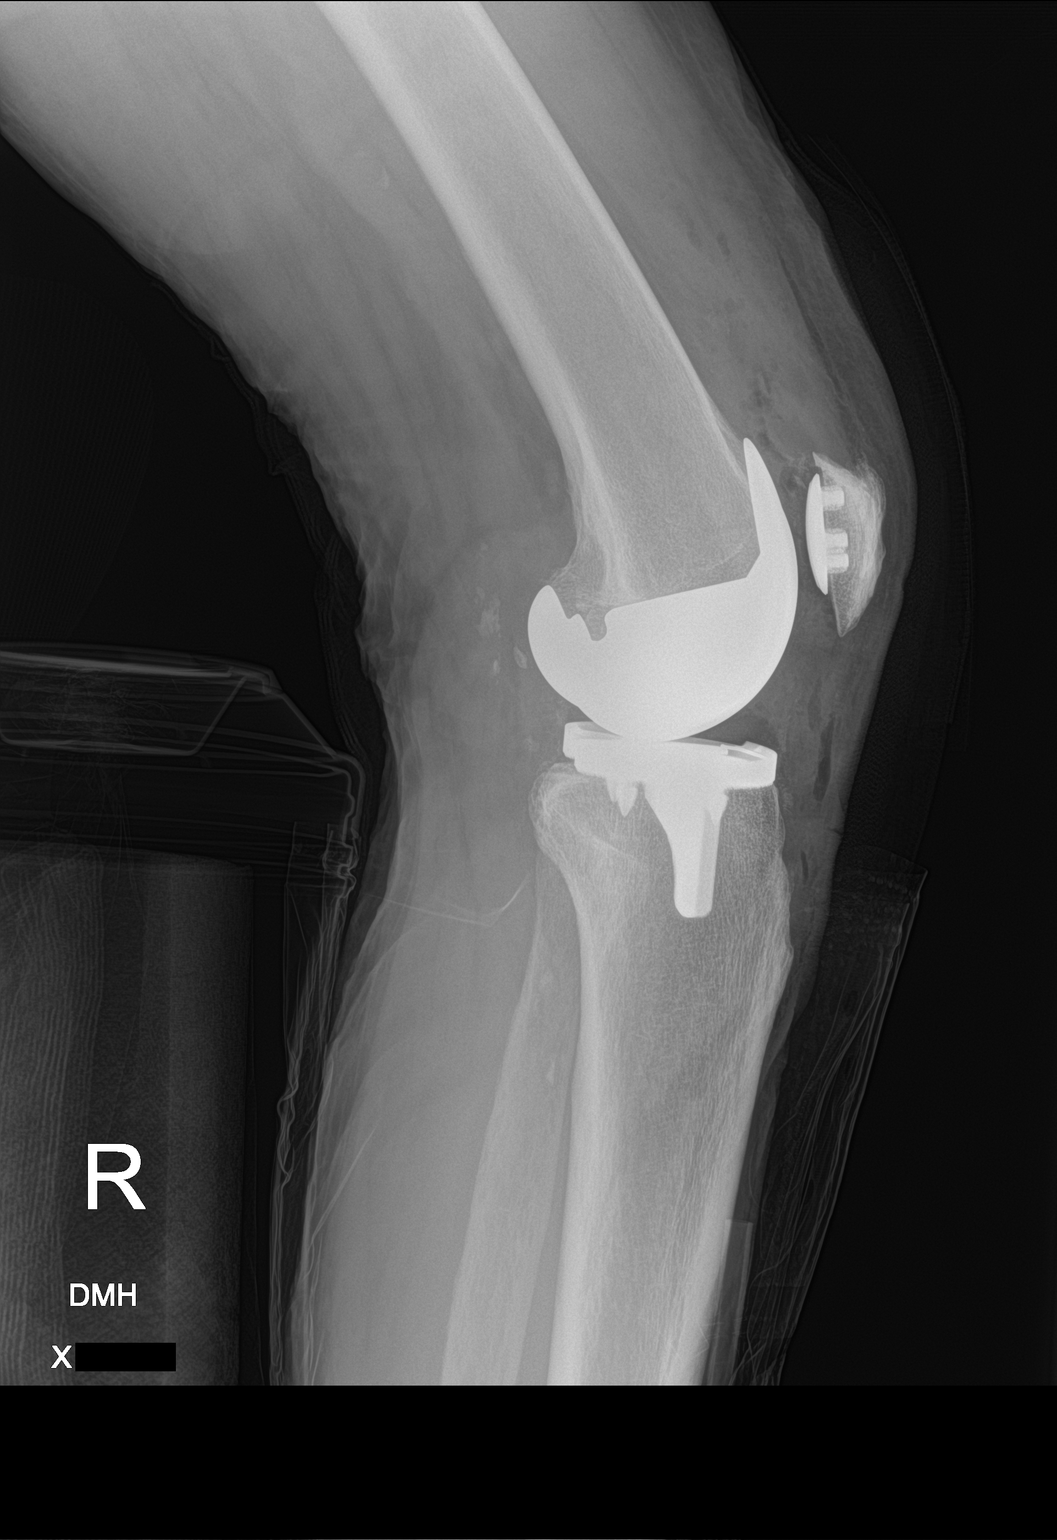

[2 of 2 positions shown; findings below may reference images not displayed]

FINDINGS: Right knee prosthesis is noted in satisfactory position. No acute
bony or soft tissue abnormality is noted.
IMPRESSION: Status post right knee replacement

## 2020-06-27 DIAGNOSIS — L720 Epidermal cyst: Secondary | ICD-10-CM | POA: Diagnosis not present

## 2020-06-27 DIAGNOSIS — L02212 Cutaneous abscess of back [any part, except buttock]: Secondary | ICD-10-CM | POA: Diagnosis not present

## 2020-06-27 DIAGNOSIS — Z85828 Personal history of other malignant neoplasm of skin: Secondary | ICD-10-CM | POA: Diagnosis not present

## 2020-07-05 DIAGNOSIS — H1131 Conjunctival hemorrhage, right eye: Secondary | ICD-10-CM | POA: Diagnosis not present

## 2020-08-24 DIAGNOSIS — Z23 Encounter for immunization: Secondary | ICD-10-CM | POA: Diagnosis not present

## 2020-08-28 ENCOUNTER — Other Ambulatory Visit: Payer: Self-pay

## 2020-08-29 ENCOUNTER — Encounter: Payer: Self-pay | Admitting: Internal Medicine

## 2020-08-29 ENCOUNTER — Ambulatory Visit (INDEPENDENT_AMBULATORY_CARE_PROVIDER_SITE_OTHER): Payer: Medicare Other | Admitting: Internal Medicine

## 2020-08-29 VITALS — BP 110/84 | HR 74 | Temp 97.9°F | Ht 71.0 in | Wt 195.9 lb

## 2020-08-29 DIAGNOSIS — E7800 Pure hypercholesterolemia, unspecified: Secondary | ICD-10-CM | POA: Diagnosis not present

## 2020-08-29 DIAGNOSIS — Z9889 Other specified postprocedural states: Secondary | ICD-10-CM | POA: Diagnosis not present

## 2020-08-29 DIAGNOSIS — I10 Essential (primary) hypertension: Secondary | ICD-10-CM

## 2020-08-29 DIAGNOSIS — J302 Other seasonal allergic rhinitis: Secondary | ICD-10-CM | POA: Diagnosis not present

## 2020-08-29 DIAGNOSIS — E039 Hypothyroidism, unspecified: Secondary | ICD-10-CM | POA: Diagnosis not present

## 2020-08-29 LAB — TSH: TSH: 0.08 u[IU]/mL — ABNORMAL LOW (ref 0.35–4.50)

## 2020-08-29 NOTE — Patient Instructions (Signed)
-  Nice seeing you today!!  -Lab work today; will notify you once results are available.  -Schedule follow up in 3 months for your physical. Please come in fasting that day.

## 2020-08-29 NOTE — Progress Notes (Signed)
New Patient Office Visit     This visit occurred during the SARS-CoV-2 public health emergency.  Safety protocols were in place, including screening questions prior to the visit, additional usage of staff PPE, and extensive cleaning of exam room while observing appropriate contact time as indicated for disinfecting solutions.    CC/Reason for Visit: Establish care, discuss chronic conditions, discuss acute concern Previous PCP: Dorothyann Peng Last Visit: 2016  HPI: Carlos Juarez is a 80 y.o. male who is coming in today for the above mentioned reasons. Past Medical History is significant for: Hypertension, hyperlipidemia and hypothyroidism.  He follows with cardiology once a year.  He has not had his TSH checked in a long time.  He is a retired Tax adviser.  He was a smoker but quit in 1976, he drinks alcohol occasionally.  He has no known drug allergies.  He has had bilateral total knee replacements and lumbar laminectomy.  His family history significant for father who died at age 33 from coronary artery disease.  All immunizations are up-to-date.  He had a colonoscopy in 2018 and was told it was his last one.  He has been having some significant sinus symptoms and seasonal allergies.  He is only taking Flonase.  Has had to take Afrin intermittently for nasal congestion.   Past Medical/Surgical History: Past Medical History:  Diagnosis Date  . Arthritis   . Cancer ([place])    skin cancer removed rom nose non melanoma  . HTN (hypertension)   . Hypercholesteremia   . Hypothyroidism   . Pneumonia   . PVC's (premature ventricular contractions)   . Synovial cyst   . Trigger middle finger    right    Past Surgical History:  Procedure Laterality Date  . CARPAL TUNNEL RELEASE    . HERNIA REPAIR     as a child r inguinal  . JOINT REPLACEMENT     TKA Dr. [name] 03/28/17  . LUMBAR LAMINECTOMY/DECOMPRESSION MICRODISCECTOMY N/A 12/25/2016   Procedure: Laminectomy and  Foraminotomy - Lumbar two-Lumbar three - Lumbar three-Lumbar four, resection of synovial cyst;  Surgeon: Eustace Moore, MD;  Location: [facility];  Service: Neurosurgery;  Laterality: N/A;  . MENISCUS REPAIR Left   . STERIOD INJECTION Right 10/07/2017   Procedure: RIGHT MIDDLE TRIGGER FINGER INJECTION;  Surgeon: Mcarthur Rossetti, MD;  Location: [facility];  Service: Orthopedics;  Laterality: Right;  . synovial cyst removed     12/25/16  . TONSILLECTOMY    . TOTAL KNEE ARTHROPLASTY Left 03/28/2017   Procedure: LEFT TOTAL KNEE ARTHROPLASTY;  Surgeon: Mcarthur Rossetti, MD;  Location[facility];  Service: Orthopedics;  Laterality: Left;  . TOTAL KNEE ARTHROPLASTY Right 10/07/2017   Procedure: RIGHT TOTAL KNEE ARTHROPLASTY;  Surgeon: Mcarthur Rossetti, MD;  Location: [facility];  Service: Orthopedics;  Laterality: Right;    Social History:  reports that he quit smoking about 46 years ago. He has a 15.00 pack-year smoking history. He has never used smokeless tobacco. He reports current alcohol use. He reports that he does not use drugs.  Allergies: Allergies  Allergen Reactions  . Epinephrine Other (See Comments)    Light headedness  . Other Anxiety    Other reaction(s): Anxiety, OTHER REACTION    Family History:  Family History  Problem Relation Age of Onset  . Hypertension Mother   . Heart disease Father   . Heart attack Father 45     Current Outpatient Medications:  .  atenolol (TENORMIN) 50 MG tablet, Take 50 mg by mouth daily., Disp: , Rfl:  .  ezetimibe-simvastatin (VYTORIN) 10-20 MG tablet, TAKE 1 TABLET DAILY, Disp: 90 tablet, Rfl: 3 .  levothyroxine (SYNTHROID, LEVOTHROID) 150 MCG tablet, Take 150 mcg by mouth daily before breakfast., Disp: , Rfl:  .  oxymetazoline (AFRIN) 0.05 % nasal spray, Place 1 spray into both nostrils 2 (two) times daily as needed for congestion., Disp: , Rfl:   Review of Systems:  Constitutional: Denies fever, chills, diaphoresis, appetite change  and fatigue.  HEENT: Denies photophobia, eye pain, redness, hearing loss, ear pain, mouth sores, trouble swallowing, neck pain, neck stiffness and tinnitus.   Respiratory: Denies SOB, DOE, cough, chest tightness,  and wheezing.   Cardiovascular: Denies chest pain, palpitations and leg swelling.  Gastrointestinal: Denies nausea, vomiting, abdominal pain, diarrhea, constipation, blood in stool and abdominal distention.  Genitourinary: Denies dysuria, urgency, frequency, hematuria, flank pain and difficulty urinating.  Endocrine: Denies: hot or cold intolerance, sweats, changes in hair or nails, polyuria, polydipsia. Musculoskeletal: Denies myalgias, back pain, joint swelling, arthralgias and gait problem.  Skin: Denies pallor, rash and wound.  Neurological: Denies dizziness, seizures, syncope, weakness, light-headedness, numbness and headaches.  Hematological: Denies adenopathy. Easy bruising, personal or family bleeding history  Psychiatric/Behavioral: Denies suicidal ideation, mood changes, confusion, nervousness, sleep disturbance and agitation    Physical Exam: Vitals:   08/29/20 1304  BP: 110/84  Pulse: 74  Temp: 97.9 F (36.6 C)  TempSrc: Oral  SpO2: 97%  Weight: 195 lb 14.4 oz (88.9 kg)  Height: 5\' 11"  (1.803 m)   Body mass index is 27.32 kg/m.  Constitutional: NAD, calm, comfortable Eyes: PERRL, lids and conjunctivae normal ENMT: Mucous membranes are moist.  Respiratory: clear to auscultation bilaterally, no wheezing, no crackles. Normal respiratory effort. No accessory muscle use.  Cardiovascular: Regular rate and rhythm, no murmurs / rubs / gallops. No extremity edema.  Neurologic: Grossly intact and nonfocal Psychiatric: Normal judgment and insight. Alert and oriented x 3. Normal mood.    Impression and Plan:  Hypertension, unspecified type  -Excellent control on atenolol which he has been on for years.  Hypercholesteremia  -Last lipid panel in August 2021 with a  total cholesterol of 179, LDL 78 and triglycerides 85.  He is on ezetimibe and simvastatin.  Hypothyroidism, unspecified type  -He is on levothyroxine 150 mcg daily. -Check TSH.  S/P lumbar laminectomy -Noted, no current back pain.  Seasonal allergies -Advised daily antihistamines, guaifenesin, Flonase, advised sporadic use of Afrin as to avoid rebound symptoms.     Patient Instructions  -Nice seeing you today!!  -Lab work today; will notify you once results are available.  -Schedule follow up in 3 months for your physical. Please come in fasting that day.     Lelon Frohlich, MD [facility]

## 2020-08-30 ENCOUNTER — Other Ambulatory Visit: Payer: Self-pay | Admitting: Internal Medicine

## 2020-08-30 DIAGNOSIS — E039 Hypothyroidism, unspecified: Secondary | ICD-10-CM

## 2020-08-30 MED ORDER — LEVOTHYROXINE SODIUM 125 MCG PO TABS: 125.0000 ug | ORAL_TABLET | Freq: Every day | ORAL | 0 refills | Status: DC

## 2020-09-01 ENCOUNTER — Telehealth: Payer: Self-pay | Admitting: *Deleted

## 2020-09-01 NOTE — Chronic Care Management (AMB) (Signed)
  Chronic Care Management   Note  09/01/2020 Name: Carlos Juarez MRN: [id number] DOB: January 23, 1941  Carlos Juarez is a 80 y.o. year old male who is a primary care patient of Isaac Bliss, Rayford Halsted, MD. I reached out to Carlos Juarez by phone today in response to a referral sent by Carlos Juarez's PCP, Isaac Bliss, Rayford Halsted, MD     Mr. Hanf was given information about Chronic Care Management services today including:  1. CCM service includes personalized support from designated clinical staff supervised by his physician, including individualized plan of care and coordination with other care providers 2. 24/7 contact phone numbers for assistance for urgent and routine care needs. 3. Service will only be billed when office clinical staff spend 20 minutes or more in a month to coordinate care. 4. Only one practitioner may furnish and bill the service in a calendar month. 5. The patient may stop CCM services at any time (effective at the end of the month) by phone call to the office staff. 6. The patient will be responsible for cost sharing (co-pay) of up to 20% of the service fee (after annual deductible is met).  Patient did not agree to enrollment in care management services and does not wish to consider at this time.  Follow up plan: Patient declines engagement by the care management team. Appropriate care team members and provider have been notified via electronic communication. The care management team is available to follow up with the patient after provider conversation with the patient regarding recommendation for care management engagement and subsequent re-referral to the care management team.   [facility]

## 2020-09-04 ENCOUNTER — Telehealth: Payer: Self-pay | Admitting: Internal Medicine

## 2020-09-04 NOTE — Telephone Encounter (Signed)
Pts spouse is calling in stating that they are going on a International flight in June and is in need of a Antigen Test done.  Pts spouse would like to have a call back.

## 2020-09-05 NOTE — Telephone Encounter (Signed)
Left message on machine for patient to go to the pharmacy.

## 2020-10-17 DIAGNOSIS — Z20822 Contact with and (suspected) exposure to covid-19: Secondary | ICD-10-CM | POA: Diagnosis not present

## 2020-10-23 ENCOUNTER — Other Ambulatory Visit: Payer: Self-pay

## 2020-10-24 ENCOUNTER — Other Ambulatory Visit (INDEPENDENT_AMBULATORY_CARE_PROVIDER_SITE_OTHER): Payer: Medicare Other

## 2020-10-24 ENCOUNTER — Other Ambulatory Visit: Payer: Self-pay

## 2020-10-24 DIAGNOSIS — E039 Hypothyroidism, unspecified: Secondary | ICD-10-CM | POA: Diagnosis not present

## 2020-10-24 LAB — TSH: TSH: 0.55 u[IU]/mL (ref 0.35–5.50)

## 2020-10-25 DIAGNOSIS — L821 Other seborrheic keratosis: Secondary | ICD-10-CM | POA: Diagnosis not present

## 2020-10-25 DIAGNOSIS — D225 Melanocytic nevi of trunk: Secondary | ICD-10-CM | POA: Diagnosis not present

## 2020-10-25 DIAGNOSIS — Z85828 Personal history of other malignant neoplasm of skin: Secondary | ICD-10-CM | POA: Diagnosis not present

## 2020-10-25 DIAGNOSIS — L723 Sebaceous cyst: Secondary | ICD-10-CM | POA: Diagnosis not present

## 2020-11-13 DIAGNOSIS — H524 Presbyopia: Secondary | ICD-10-CM | POA: Diagnosis not present

## 2020-11-13 DIAGNOSIS — H25041 Posterior subcapsular polar age-related cataract, right eye: Secondary | ICD-10-CM | POA: Diagnosis not present

## 2020-11-13 DIAGNOSIS — H52203 Unspecified astigmatism, bilateral: Secondary | ICD-10-CM | POA: Diagnosis not present

## 2020-11-13 DIAGNOSIS — H2513 Age-related nuclear cataract, bilateral: Secondary | ICD-10-CM | POA: Diagnosis not present

## 2020-12-04 ENCOUNTER — Ambulatory Visit (INDEPENDENT_AMBULATORY_CARE_PROVIDER_SITE_OTHER): Payer: Medicare Other

## 2020-12-04 ENCOUNTER — Ambulatory Visit (INDEPENDENT_AMBULATORY_CARE_PROVIDER_SITE_OTHER): Payer: Medicare Other | Admitting: Orthopaedic Surgery

## 2020-12-04 DIAGNOSIS — M79671 Pain in right foot: Secondary | ICD-10-CM | POA: Diagnosis not present

## 2020-12-04 NOTE — Progress Notes (Signed)
The patient comes in today with acute right foot pain.  He says he does not remember any type of injury to his foot but is very active and works with a Physiological scientist as well.  He 80 years old.  He developed swelling and redness over the weekend and he had difficulty weightbearing on the right foot yesterday.  He has been taking Aleve and taking a load off his foot and so it does feel better today.  On examination of his right foot there is some swelling on the dorsal lateral aspect of the foot and some redness.  There is no evidence of infection at all.  He has excellent range of motion of his ankle.  There is no instability of the midfoot and his foot is well-perfused with normal sensation.  3 views of the right foot show no obvious fracture or malalignment or acute injury.  I want him to continue to be careful over the next week with his foot.  He certainly could have a stress fracture but we would just watch this with observation and activity modification as well as anti-inflammatories.  If this worsens or does not feel better in 2 to 3 weeks he should come back and see [place].  All questions and concerns were answered and addressed.  Follow-up is as needed.

## 2020-12-15 ENCOUNTER — Other Ambulatory Visit: Payer: Medicare Other | Admitting: *Deleted

## 2020-12-15 ENCOUNTER — Other Ambulatory Visit: Payer: Self-pay

## 2020-12-15 DIAGNOSIS — E782 Mixed hyperlipidemia: Secondary | ICD-10-CM

## 2020-12-15 DIAGNOSIS — I10 Essential (primary) hypertension: Secondary | ICD-10-CM

## 2020-12-15 LAB — BASIC METABOLIC PANEL
BUN/Creatinine Ratio: 18 (ref 10–24)
BUN: 19 mg/dL (ref 8–27)
CO2: 27 mmol/L (ref 20–29)
Calcium: 9 mg/dL (ref 8.6–10.2)
Chloride: 99 mmol/L (ref 96–106)
Creatinine, Ser: 1.07 mg/dL (ref 0.76–1.27)
Glucose: 109 mg/dL — ABNORMAL HIGH (ref 65–99)
Potassium: 3.8 mmol/L (ref 3.5–5.2)
Sodium: 138 mmol/L (ref 134–144)
eGFR: 71 mL/min/{1.73_m2} (ref >59)

## 2020-12-15 LAB — HEPATIC FUNCTION PANEL
ALT: 27 IU/L (ref 0–44)
AST: 26 IU/L (ref 0–40)
Albumin: 3.8 g/dL (ref 3.7–4.7)
Alkaline Phosphatase: 73 IU/L (ref 44–121)
Bilirubin Total: 0.9 mg/dL (ref 0.0–1.2)
Bilirubin, Direct: 0.24 mg/dL (ref 0.00–0.40)
Total Protein: 5.9 g/dL — ABNORMAL LOW (ref 6.0–8.5)

## 2020-12-15 LAB — LIPID PANEL
Chol/HDL Ratio: 2.1 ratio (ref 0.0–5.0)
Cholesterol, Total: 195 mg/dL (ref 100–199)
HDL: 92 mg/dL (ref >39)
LDL Chol Calc (NIH): 88 mg/dL (ref 0–99)
Triglycerides: 86 mg/dL (ref 0–149)
VLDL Cholesterol Cal: 15 mg/dL (ref 5–40)

## 2020-12-18 ENCOUNTER — Encounter: Payer: Self-pay | Admitting: Cardiovascular Disease

## 2020-12-18 ENCOUNTER — Other Ambulatory Visit: Payer: Self-pay | Admitting: Cardiovascular Disease

## 2020-12-18 NOTE — Progress Notes (Signed)
CARDIOLOGY OFFICE NOTE  Date:  12/20/2020    [name] Date of Birth: October 19, 1940 Medical Record [id number]  PCP:  Isaac Bliss, Rayford Halsted, MD  Cardiologist:  Former patient of Dr. Sherryl Barters  , now [facility]   Chief Complaint  Patient presents with   Hyperlipidemia   Problem list 1. Essential hypertension 2. Hyperlipidemia 3. Hypothyroidism   Notes from Tru[facility].  Carlos Juarez is a 80 y.o. male who presents today for a 4 month check. Former patient of Dr. Mare Ferrari.   He has a history of essential hypertension and a history of hypercholesterolemia. He does not have any history of ischemic heart disease. He had a normal treadmill Cardiolite stress test on 07/24/05 which showed no ischemia and his ejection fraction was 57% with no wall motion abnormalities. He also has a history of hypothyroid disease.   Last seen back in November - cardiac status was felt to be stable. Had had a fall.   Comes back today. Here alone. Remains on NSAID. BP high here today but says he has good control at home. Asking for his Xanax refill - would defer to PCP. He had his labs done earlier this month - those are reviewed. Blood sugar creeping up. He says he is walking most days but knows he needs to lose weight. No chest pain. Breathing is good.   Sept. 21, 2017:  Carlos Juarez is seen today for the first time.  Transfer from [place] .  BP is normal at home.   Is elevated here.  No CP or dyspnea  Works out with a Clinical research associate.  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   July 03, 2016   Doing well from a cardiac standpoint. Had carpal tunnel surgery on his right hand several weeks ago. Seems to be healing up nicely. His blood pressure and heart rate are well controlled.  He's back working out with his trainer on a regular basis. His blood pressure at home is very well-controlled. His blood pressure here today in the office is  slightly elevated. Weight and chol levels have increased    Jan 14 2017: Carlos Juarez is seen today for follow up visit . BP is elevated today here in the office.   BP readings at home look good.  Avoids salt Has started back exercise.   Had back surgery several months ,  Feeling better.  No further hip pain .   Oct. 2, 2019: Doing well from a cardiac standpoint  Has had bilateral knee replacements [name] )   Oct. 1, 2020:  Carlos Juarez is see for follow up of his hypertension and hyperlipidemia. Doing well Exercises regularly  works out with trainer twice a week, rowing machine    Sept. 3, 2021:  Carlos Juarez is seen today for follow up of his HTN, HLD, Still very active BP is typcially well controlled.   Is retired as a Education officer, museum   No cp , no dyspnea  Exercises daily  bp has been well controlled.   Sept. 7, 2022: Carlos Juarez is seen today for follow up of his HTN and HLD  Exercises regularly ,  No CP or dyspnea.   , no syncope  Lipids from last week were reviewed.   Past Medical History:  Diagnosis Date   Arthritis    Cancer ([place])    skin cancer removed rom nose non melanoma   HTN (hypertension)  Hypercholesteremia    Hypothyroidism    Pneumonia    PVC's (premature ventricular contractions)    Synovial cyst    Trigger middle finger    right    Past Surgical History:  Procedure Laterality Date   CARPAL TUNNEL RELEASE     HERNIA REPAIR     as a child r inguinal   JOINT REPLACEMENT     TKA Dr. [name] 03/28/17   LUMBAR LAMINECTOMY/DECOMPRESSION MICRODISCECTOMY N/A 12/25/2016   Procedure: Laminectomy and Foraminotomy - Lumbar two-Lumbar three - Lumbar three-Lumbar four, resection of synovial cyst;  Surgeon: Eustace Moore, MD;  Location: [facility];  Service: Neurosurgery;  Laterality: N/A;   MENISCUS REPAIR Left    STERIOD INJECTION Right 10/07/2017   Procedure: RIGHT MIDDLE TRIGGER FINGER INJECTION;  Surgeon: Mcarthur Rossetti, MD;  Location: [facility];   Service: Orthopedics;  Laterality: Right;   synovial cyst removed     12/25/16   TONSILLECTOMY     TOTAL KNEE ARTHROPLASTY Left 03/28/2017   Procedure: LEFT TOTAL KNEE ARTHROPLASTY;  Surgeon: Mcarthur Rossetti, MD;  [facility];  Service: Orthopedics;  Laterality: Left;   TOTAL KNEE ARTHROPLASTY Right 10/07/2017   Procedure: RIGHT TOTAL KNEE ARTHROPLASTY;  Surgeon: Mcarthur Rossetti, MD;  Location: [facility];  Service: Orthopedics;  Laterality: Right;     Medications: Current Outpatient Medications  Medication Sig Dispense Refill   atenolol (TENORMIN) 50 MG tablet Take 50 mg by mouth daily.     ezetimibe-simvastatin (VYTORIN) 10-20 MG tablet TAKE 1 TABLET DAILY 90 tablet 3   levothyroxine (SYNTHROID) 125 MCG tablet Take 1 tablet (125 mcg total) by mouth daily. 90 tablet 0   oxymetazoline (AFRIN) 0.05 % nasal spray Place 1 spray into both nostrils 2 (two) times daily as needed for congestion.     No current facility-administered medications for this visit.    Allergies: Allergies  Allergen Reactions   Epinephrine Other (See Comments)    Light headedness   Other Anxiety    Other reaction(s): Anxiety, OTHER REACTION    Social History: The patient  reports that he quit smoking about 46 years ago. He has a 15.00 pack-year smoking history. He has never used smokeless tobacco. He reports current alcohol use. He reports that he does not use drugs.   Family History: The patient's family history includes Heart attack (age of onset: 90) in his father; Heart disease in his father; Hypertension in his mother.   Review of Systems: Please see the history of present illness.   Otherwise, the review of systems is positive for none.   All other systems are reviewed and negative.    Physical Exam: Blood pressure 120/82, pulse (!) 56, height '5\' 10"'$  (1.778 m), weight 194 lb 4 oz (88.1 kg).  GEN:  Well nourished, well developed in no acute distress HEENT: Normal NECK: No JVD; No  carotid bruits LYMPHATICS: No lymphadenopathy CARDIAC: RRR , no murmurs, rubs, gallops RESPIRATORY:  Clear to auscultation without rales, wheezing or rhonchi  ABDOMEN: Soft, non-tender, non-distended MUSCULOSKELETAL:  No edema; No deformity  SKIN: Warm and dry NEUROLOGIC:  Alert and oriented x 3    LABORATORY DATA:  EKG:   December 20, 2020: Sinus bradycardia 47.  No ST or T wave changes.  Lab Results  Component Value Date   WBC 9.8 10/08/2017   HGB 13.8 10/08/2017   HCT 41.2 10/08/2017   PLT 174 10/08/2017   GLUCOSE 109 (H) 12/15/2020   CHOL 195 12/15/2020  TRIG 86 12/15/2020   HDL 92 12/15/2020   LDLDIRECT 103.7 01/03/2012   LDLCALC 88 12/15/2020   ALT 27 12/15/2020   AST 26 12/15/2020   NA 138 12/15/2020   K 3.8 12/15/2020   CL 99 12/15/2020   CREATININE 1.07 12/15/2020   BUN 19 12/15/2020   CO2 27 12/15/2020   TSH 0.55 10/24/2020   INR 0.98 12/25/2016    BNP (last 3 results) No results for input(s): BNP in the last 8760 hours.  ProBNP (last 3 results) No results for input(s): PROBNP in the last 8760 hours.   Other Studies Reviewed Today:   Assessment/Plan: 1.  Essential Hypertension:  Blood pressure is very well controlled.  He exercises regularly.  He watches his diet.    2. Hypercholesterolemia  His lipids are very well controlled.  We will check them again next year.   4. Hypothyroidism - Followed by his primary medical doctor.  Labs/ tests ordered today include:    Orders Placed This Encounter  Procedures   EKG 12-Lead     Mertie Moores, MD  12/20/2020 11:13 AM    [place],  [address] [place], [place]  [id number] Pager [phone] Phone: [phone]; Fax: [phone]

## 2020-12-20 ENCOUNTER — Other Ambulatory Visit: Payer: Self-pay

## 2020-12-20 ENCOUNTER — Ambulatory Visit (INDEPENDENT_AMBULATORY_CARE_PROVIDER_SITE_OTHER): Payer: Medicare Other | Admitting: Cardiovascular Disease

## 2020-12-20 VITALS — BP 120/82 | HR 56 | Ht 70.0 in | Wt 194.2 lb

## 2020-12-20 DIAGNOSIS — E782 Mixed hyperlipidemia: Secondary | ICD-10-CM

## 2020-12-20 DIAGNOSIS — I10 Essential (primary) hypertension: Secondary | ICD-10-CM

## 2020-12-20 DIAGNOSIS — I493 Ventricular premature depolarization: Secondary | ICD-10-CM

## 2020-12-20 NOTE — Patient Instructions (Signed)

## 2021-01-02 DIAGNOSIS — H25811 Combined forms of age-related cataract, right eye: Secondary | ICD-10-CM | POA: Diagnosis not present

## 2021-01-02 DIAGNOSIS — H25041 Posterior subcapsular polar age-related cataract, right eye: Secondary | ICD-10-CM | POA: Diagnosis not present

## 2021-01-02 DIAGNOSIS — H2511 Age-related nuclear cataract, right eye: Secondary | ICD-10-CM | POA: Diagnosis not present

## 2021-01-17 DIAGNOSIS — Z23 Encounter for immunization: Secondary | ICD-10-CM | POA: Diagnosis not present

## 2021-01-22 DIAGNOSIS — Z23 Encounter for immunization: Secondary | ICD-10-CM | POA: Diagnosis not present

## 2021-02-16 ENCOUNTER — Telehealth: Payer: Self-pay | Admitting: Internal Medicine

## 2021-02-16 ENCOUNTER — Other Ambulatory Visit: Payer: Self-pay | Admitting: *Deleted

## 2021-02-16 MED ORDER — EZETIMIBE-SIMVASTATIN 10-20 MG PO TABS: 1.0000 | ORAL_TABLET | Freq: Every day | ORAL | 3 refills | Status: DC

## 2021-02-16 MED ORDER — LEVOTHYROXINE SODIUM 125 MCG PO TABS: 125.0000 ug | ORAL_TABLET | Freq: Every day | ORAL | 1 refills | Status: AC

## 2021-02-16 NOTE — Telephone Encounter (Signed)
Patient called because he needs a new prescription sent in for levothyroxine (SYNTHROID) 125 MCG tablet  Patient states the pharmacy will not fill it because it was sent over with no refills     Please send to   [place], [place] Phone:  [phone]  Fax:  [phone]          Good callback number is [phone]      Please advise

## 2021-02-16 NOTE — Telephone Encounter (Signed)
Refill sent.

## 2021-03-13 DIAGNOSIS — H2512 Age-related nuclear cataract, left eye: Secondary | ICD-10-CM | POA: Diagnosis not present

## 2021-03-13 DIAGNOSIS — H52202 Unspecified astigmatism, left eye: Secondary | ICD-10-CM | POA: Diagnosis not present

## 2021-03-13 DIAGNOSIS — H25042 Posterior subcapsular polar age-related cataract, left eye: Secondary | ICD-10-CM | POA: Diagnosis not present

## 2021-03-13 DIAGNOSIS — H25812 Combined forms of age-related cataract, left eye: Secondary | ICD-10-CM | POA: Diagnosis not present

## 2021-07-16 ENCOUNTER — Encounter: Payer: Self-pay | Admitting: Internal Medicine

## 2021-07-16 ENCOUNTER — Ambulatory Visit (INDEPENDENT_AMBULATORY_CARE_PROVIDER_SITE_OTHER): Payer: Medicare Other | Admitting: Internal Medicine

## 2021-07-16 ENCOUNTER — Telehealth: Payer: Self-pay | Admitting: Internal Medicine

## 2021-07-16 VITALS — BP 110/80 | HR 65 | Temp 97.7°F | Wt 197.7 lb

## 2021-07-16 DIAGNOSIS — J0100 Acute maxillary sinusitis, unspecified: Secondary | ICD-10-CM | POA: Diagnosis not present

## 2021-07-16 DIAGNOSIS — J029 Acute pharyngitis, unspecified: Secondary | ICD-10-CM | POA: Diagnosis not present

## 2021-07-16 DIAGNOSIS — H66002 Acute suppurative otitis media without spontaneous rupture of ear drum, left ear: Secondary | ICD-10-CM

## 2021-07-16 DIAGNOSIS — R051 Acute cough: Secondary | ICD-10-CM

## 2021-07-16 DIAGNOSIS — R519 Headache, unspecified: Secondary | ICD-10-CM

## 2021-07-16 LAB — POCT RAPID STREP A (OFFICE): Rapid Strep A Screen: NEGATIVE

## 2021-07-16 LAB — POCT INFLUENZA A/B
Influenza A, POC: NEGATIVE
Influenza B, POC: NEGATIVE

## 2021-07-16 LAB — POC COVID19 BINAXNOW: SARS Coronavirus 2 Ag: NEGATIVE

## 2021-07-16 MED ORDER — AMOXICILLIN-POT CLAVULANATE 875-125 MG PO TABS: 1.0000 | ORAL_TABLET | Freq: Two times a day (BID) | ORAL | 0 refills | Status: AC

## 2021-07-16 NOTE — Telephone Encounter (Signed)
Patient calling in with respiratory symptoms: ?Shortness of breath, chest pain, palpitations or other red words send to Triage ? ?Does the patient have a fever over 100, cough, congestion, sore throat, runny nose, lost of taste/smell (please list symptoms that patient has)?sinus pressure ?What date did symptoms start?a few days ?(If over 5 days ago, pt may be scheduled for in person visit) ? ?Have you tested for Covid in the last 5 days? No  ? ?If yes, was it positive '[]'$  OR negative '[]'$ ? If positive in the last 5 days, please schedule virtual visit now. If negative, schedule for an in person OV with the next available provider if PCP has no openings. Please also let patient know they will be tested again (follow the script below) ? ?"you will have to arrive 34mns prior to your appt time to be Covid tested. Please park in back of office at the cone & call [phone] let the staff know you have arrived. A staff member will meet you at your car to do a rapid covid test. Once the test has resulted you will be notified by phone of your results to determine if appt will remain an in person visit or be converted to a virtual/phone visit. If you arrive less than 329ms before your appt time, your visit will be automatically converted to virtual & any recommended testing will happen AFTER the visit." ?Pt has an appt with dr heJerilee Hoh-06-2021 130 pm ? ?THINGS TO REMEMBER ? ?If no availability for virtual visit in office,  please schedule another [facility] office ? ?If no availability at another [facility], please instruct patient that they can schedule an evisit or virtual visit through their mychart account. Visits up to 8pm ? ?patients can be seen in office 5 days after positive COVID test ? ?  ?

## 2021-07-16 NOTE — Progress Notes (Signed)
? ? ? ?Acute office Visit ? ? ? ? ?This visit occurred during the SARS-CoV-2 public health emergency.  Safety protocols were in place, including screening questions prior to the visit, additional usage of staff PPE, and extensive cleaning of exam room while observing appropriate contact time as indicated for disinfecting solutions.  ? ? ?CC/Reason for Visit: Left ear pain, sinus pain and pressure, purulent discharge ? ?HPI: Carlos Juarez is a 81 y.o. male who is coming in today for the above mentioned reasons.  2 weeks ago he flew to [place].  About 10 days ago he started experiencing cough that is productive, significant maxillary sinus pain and pressure with purulent mucus discharge, what he describes as pinkeye of his right eye but also progressed to his left eye.  Most significantly he has had left ear pain, no drainage, sensation of fullness.  States he had a fever over the weekend, his Tmax was 99.5. ? ?Past Medical/Surgical History: ?Past Medical History:  ?Diagnosis Date  ? Arthritis   ? Cancer [facility])   ? skin cancer removed rom nose non melanoma  ? HTN (hypertension)   ? Hypercholesteremia   ? Hypothyroidism   ? Pneumonia   ? PVC's (premature ventricular contractions)   ? Synovial cyst   ? Trigger middle finger   ? right  ? ? ?Past Surgical History:  ?Procedure Laterality Date  ? CARPAL TUNNEL RELEASE    ? HERNIA REPAIR    ? as a child r inguinal  ? JOINT REPLACEMENT    ? TKA Dr. [name] 03/28/17  ? LUMBAR LAMINECTOMY/DECOMPRESSION MICRODISCECTOMY N/A 12/25/2016  ? Procedure: Laminectomy and Foraminotomy - Lumbar two-Lumbar three - Lumbar three-Lumbar four, resection of synovial cyst;  Surgeon: Eustace Moore, MD;  Location: [place];  Service: Neurosurgery;  Laterality: N/A;  ? MENISCUS REPAIR Left   ? STERIOD INJECTION Right 10/07/2017  ? Procedure: RIGHT MIDDLE TRIGGER FINGER INJECTION;  Surgeon: Mcarthur Rossetti, MD;  Location: [place];  Service: Orthopedics;  Laterality: Right;  ? synovial  cyst removed    ? 12/25/16  ? TONSILLECTOMY    ? TOTAL KNEE ARTHROPLASTY Left 03/28/2017  ? Procedure: LEFT TOTAL KNEE ARTHROPLASTY;  Surgeon: Mcarthur Rossetti, MD;  [facility];  Service: Orthopedics;  Laterality: Left;  ? TOTAL KNEE ARTHROPLASTY Right 10/07/2017  ? Procedure: RIGHT TOTAL KNEE ARTHROPLASTY;  Surgeon: Mcarthur Rossetti, MD;  Location: [place];  Service: Orthopedics;  Laterality: Right;  ? ? ?Social History: ? reports that he quit smoking about 47 years ago. His smoking use included cigarettes. He has a 15.00 pack-year smoking history. He has never used smokeless tobacco. He reports current alcohol use. He reports that he does not use drugs. ? ?Allergies: ?Allergies  ?Allergen Reactions  ? Epinephrine Other (See Comments)  ?  Light headedness  ? Other Anxiety  ?  Other reaction(s): Anxiety, OTHER REACTION  ? ? ?Family History:  ?Family History  ?Problem Relation Age of Onset  ? Hypertension Mother   ? Heart disease Father   ? Heart attack Father 16  ? ? ? ?Current Outpatient Medications:  ?  amoxicillin-clavulanate (AUGMENTIN) 875-125 MG tablet, Take 1 tablet by mouth 2 (two) times daily for 7 days., Disp: 14 tablet, Rfl: 0 ?  atenolol (TENORMIN) 50 MG tablet, Take 50 mg by mouth daily., Disp: , Rfl:  ?  ezetimibe-simvastatin (VYTORIN) 10-20 MG tablet, Take 1 tablet by mouth daily., Disp: 90 tablet, Rfl: 3 ?  levothyroxine (SYNTHROID) 125  MCG tablet, Take 1 tablet (125 mcg total) by mouth daily., Disp: 90 tablet, Rfl: 1 ?  oxymetazoline (AFRIN) 0.05 % nasal spray, Place 1 spray into both nostrils 2 (two) times daily as needed for congestion., Disp: , Rfl:  ? ?Review of Systems:  ?Constitutional: Denies diaphoresis. ?HEENT: Denies photophobia, eye pain, redness, mouth sores, trouble swallowing, neck pain, neck stiffness and tinnitus.   ?Respiratory: Denies SOB, DOE, cough, chest tightness,  and wheezing.   ?Cardiovascular: Denies chest pain, palpitations and leg swelling.   ?Gastrointestinal: Denies nausea, vomiting, abdominal pain, diarrhea, constipation, blood in stool and abdominal distention.  ?Genitourinary: Denies dysuria, urgency, frequency, hematuria, flank pain and difficulty urinating.  ?Endocrine: Denies: hot or cold intolerance, sweats, changes in hair or nails, polyuria, polydipsia. ?Musculoskeletal: Denies myalgias, back pain, joint swelling, arthralgias and gait problem.  ?Skin: Denies pallor, rash and wound.  ?Neurological: Denies dizziness, seizures, syncope, weakness, light-headedness, numbness and headaches.  ?Hematological: Denies adenopathy. Easy bruising, personal or family bleeding history  ?Psychiatric/Behavioral: Denies suicidal ideation, mood changes, confusion, nervousness, sleep disturbance and agitation ? ? ? ?Physical Exam: ?Vitals:  ? 07/16/21 1319  ?BP: 110/80  ?Pulse: 65  ?Temp: 97.7 ?F (36.5 ?C)  ?TempSrc: Oral  ?SpO2: 97%  ?Weight: 197 lb 11.2 oz (89.7 kg)  ? ? ?Body mass index is 28.37 kg/m?. ? ? ?Constitutional: NAD, calm, comfortable ?Eyes: PERRL, significant erythema of right conjunctiva and sclera also the left to a lesser degree ?ENMT: Mucous membranes are moist. Posterior pharynx is erythematous but clear of any exudate or lesions. Normal dentition. Tympanic membrane is pearly white, no erythema or bulging on the right, left shows significant erythema of the tympanic membrane.  No visible air-fluid levels. ?Neck: normal, supple, no masses, no thyromegaly ?Respiratory: clear to auscultation bilaterally, no wheezing, no crackles. Normal respiratory effort. No accessory muscle use.  ?Cardiovascular: Regular rate and rhythm, no murmurs / rubs / gallops. No extremity edema.  ?Neurologic: Grossly intact and nonfocal ?Psychiatric: Normal judgment and insight. Alert and oriented x 3. Normal mood.  ? ? ?Impression and Plan: ? ?Sore throat - Plan: POCT Influenza A/B, POCT rapid strep A ? ?Acute cough - Plan: POC COVID-19 BinaxNow, POCT Influenza  A/B ? ?Facial pain - Plan: POC COVID-19 BinaxNow, POCT Influenza A/B ? ?Acute suppurative otitis media of left ear without spontaneous rupture of tympanic membrane, recurrence not specified - Plan: amoxicillin-clavulanate (AUGMENTIN) 875-125 MG tablet ? ?Acute non-recurrent maxillary sinusitis ? ?-In office flu, COVID, strep tests have been negative. ?-Based on exam today, suspect left otitis media and possibly acute maxillary sinusitis. ?-I will treat with 7 days of Augmentin.  He knows to return if issues persist. ? ? ? ?Time spent:31 minutes reviewing chart, interviewing and examining patient and formulating plan of care. ? ? ? ? ?Lelon Frohlich, MD ?[facility] ? ? ?

## 2021-07-23 ENCOUNTER — Telehealth: Payer: Self-pay | Admitting: Cardiovascular Disease

## 2021-07-23 NOTE — Telephone Encounter (Signed)
Pt states that his wife was concerned this morning when he took his BP and got 105/59. Pt states that this is not abnormal for him and that his pulse is usually low as well, but denies symptoms associated with these readings. Asked pt for more readings and he supplies: ?07/22/21 112/64  HR=45 ?07/18/21 128/79  HR=63 ?07/15/21 104/56  HR=46 ?07/08/21 119/61  HR=49 ? ?Pt states that he is having "no symptoms at all with any of this." Denies dizziness, confusion, lethargy. Does condone 2-3 episodes in the last year of feeling "shaky and maybe a little dizzy, but that's not happened but once in the last 2-3 months."  ?Verified that above readings are AM readings prior to food or medications. Pt again states that he has no concerns, just wanted to make the doctor aware. ?Will route to Dr Acie Fredrickson for review.  ?

## 2021-07-23 NOTE — Telephone Encounter (Signed)
Pt c/o BP issue: STAT if pt c/o blurred vision, one-sided weakness or slurred speech ? ?1. What are your last 5 BP readings? 111/62 ? ?2. Are you having any other symptoms (ex. Dizziness, headache, blurred vision, passed out)? Dizziness ? ?3. What is your BP issue? Pt states that BP has been low lately. ? ?STAT if HR is under 50 or over 120 ?(normal HR is 60-100 beats per minute) ? ?What is your heart rate? 40 ? ?Do you have a log of your heart rate readings (document readings)? No ? ?Do you have any other symptoms? Dizziness ?

## 2021-07-23 NOTE — Telephone Encounter (Signed)
Nahser, Wonda Cheng, MD  You 1 hour ago (2:59 PM)  ? ?Thank you,  ?With his stable BP and HR reading and lack of symptoms, will continue to observe.  ?He should make sure his stays hydrated.  ? ?PN   ? ?Called and made pt aware to stay hydrated and continue to monitor vitals. ?

## 2021-07-24 ENCOUNTER — Encounter: Payer: Self-pay | Admitting: Family Medicine

## 2021-07-24 ENCOUNTER — Ambulatory Visit (INDEPENDENT_AMBULATORY_CARE_PROVIDER_SITE_OTHER): Payer: Medicare Other | Admitting: Family Medicine

## 2021-07-24 VITALS — BP 110/64 | HR 52 | Temp 97.6°F | Wt 200.0 lb

## 2021-07-24 DIAGNOSIS — H6690 Otitis media, unspecified, unspecified ear: Secondary | ICD-10-CM

## 2021-07-24 MED ORDER — AMOXICILLIN-POT CLAVULANATE 875-125 MG PO TABS: 1.0000 | ORAL_TABLET | Freq: Two times a day (BID) | ORAL | 0 refills | Status: DC

## 2021-07-24 MED ORDER — METHYLPREDNISOLONE 4 MG PO TBPK: ORAL_TABLET | ORAL | 0 refills | Status: DC

## 2021-07-24 NOTE — Progress Notes (Signed)
? ?  Subjective:  ? ? Patient ID: Carlos Juarez, male    DOB: [birth year], 81 y.o.   MRN: [id number] ? ?HPI ?Here to follow up on an ear/sinus infection. He was seen here on 07-16-21 and his left TM was red. He was treated with 7 days of Augmentin, and he improved somewhat. His sinus symptoms have resolved but he still feels fullness in the left ear. No pain or fever.  ? ? ?Review of Systems  ?Constitutional: Negative.   ?HENT:  Positive for congestion and hearing loss. Negative for ear pain, postnasal drip, sinus pain and sore throat.   ?Eyes: Negative.   ?Respiratory: Negative.    ? ?   ?Objective:  ? Physical Exam ?Constitutional:   ?   Appearance: Normal appearance. He is not ill-appearing.  ?HENT:  ?   Right Ear: Tympanic membrane, ear canal and external ear normal.  ?   Left Ear: External ear normal.  ?   Ears:  ?   Comments: Left TM is red and slightly retracted  ?   Nose: Nose normal.  ?   Mouth/Throat:  ?   Pharynx: Oropharynx is clear.  ?Eyes:  ?   Conjunctiva/sclera: Conjunctivae normal.  ?Pulmonary:  ?   Effort: Pulmonary effort is normal.  ?   Breath sounds: Normal breath sounds.  ?Lymphadenopathy:  ?   Cervical: No cervical adenopathy.  ?Neurological:  ?   Mental Status: He is alert.  ? ? ? ? ? ?   ?Assessment & Plan:  ?Partially treated left OM. He is given another 7 days of Augmentin, and we will add a Medrol dose pack. Recheck as needed.  ?Alysia Penna, MD ? ? ?

## 2021-08-08 DIAGNOSIS — H6502 Acute serous otitis media, left ear: Secondary | ICD-10-CM | POA: Diagnosis not present

## 2021-08-08 DIAGNOSIS — H9113 Presbycusis, bilateral: Secondary | ICD-10-CM | POA: Diagnosis not present

## 2021-08-14 DIAGNOSIS — Z20822 Contact with and (suspected) exposure to covid-19: Secondary | ICD-10-CM | POA: Diagnosis not present

## 2021-08-16 DIAGNOSIS — Z20822 Contact with and (suspected) exposure to covid-19: Secondary | ICD-10-CM | POA: Diagnosis not present

## 2021-08-23 ENCOUNTER — Telehealth: Payer: Self-pay | Admitting: Cardiovascular Disease

## 2021-08-23 DIAGNOSIS — E7800 Pure hypercholesterolemia, unspecified: Secondary | ICD-10-CM

## 2021-08-23 DIAGNOSIS — E782 Mixed hyperlipidemia: Secondary | ICD-10-CM

## 2021-08-23 DIAGNOSIS — I10 Essential (primary) hypertension: Secondary | ICD-10-CM

## 2021-08-23 NOTE — Telephone Encounter (Signed)
Patient has appt for 9/5, he would like to get labs done prior to his visit.  ?

## 2021-08-24 NOTE — Telephone Encounter (Signed)
Pt will come in on 12/14/21 for fasting lipid ,liver and bmet  ?

## 2021-08-30 DIAGNOSIS — H6502 Acute serous otitis media, left ear: Secondary | ICD-10-CM | POA: Diagnosis not present

## 2021-08-30 DIAGNOSIS — H9113 Presbycusis, bilateral: Secondary | ICD-10-CM | POA: Diagnosis not present

## 2021-10-15 NOTE — Progress Notes (Signed)
ACUTE VISIT No chief complaint on file.  HPI: Carlos Juarez is a 81 y.o. male, who is here today complaining of *** HPI  Review of Systems Rest see pertinent positives and negatives per HPI.  Current Outpatient Medications on File Prior to Visit  Medication Sig Dispense Refill  . amoxicillin-clavulanate (AUGMENTIN) 875-125 MG tablet Take 1 tablet by mouth 2 (two) times daily. 14 tablet 0  . atenolol (TENORMIN) 50 MG tablet Take 50 mg by mouth daily.    Marland Kitchen ezetimibe-simvastatin (VYTORIN) 10-20 MG tablet Take 1 tablet by mouth daily. 90 tablet 3  . levothyroxine (SYNTHROID) 125 MCG tablet Take 1 tablet (125 mcg total) by mouth daily. 90 tablet 1  . methylPREDNISolone (MEDROL DOSEPAK) 4 MG TBPK tablet As directed 21 tablet 0  . oxymetazoline (AFRIN) 0.05 % nasal spray Place 1 spray into both nostrils 2 (two) times daily as needed for congestion.     No current facility-administered medications on file prior to visit.     Past Medical History:  Diagnosis Date  . Arthritis   . Cancer ([place])    skin cancer removed rom nose non melanoma  . HTN (hypertension)   . Hypercholesteremia   . Hypothyroidism   . Pneumonia   . PVC's (premature ventricular contractions)   . Synovial cyst   . Trigger middle finger    right   Allergies  Allergen Reactions  . Epinephrine Other (See Comments)    Light headedness  . Other Anxiety    Other reaction(s): Anxiety, OTHER REACTION    Social History   Socioeconomic History  . Marital status: Married    Spouse name: Not on file  . Number of children: Not on file  . Years of education: Not on file  . Highest education level: Not on file  Occupational History  . Not on file  Tobacco Use  . Smoking status: Former    Packs/day: 1.00    Years: 15.00    Total pack years: 15.00    Types: Cigarettes    Quit date: 07/15/1974    Years since quitting: 47.2  . Smokeless tobacco: Never  . Tobacco comments:    smoked heavy 15 years  and then quit  Vaping Use  . Vaping Use: Never used  Substance and Sexual Activity  . Alcohol use: Yes    Alcohol/week: 0.0 standard drinks of alcohol    Comment: Wine with meals at times   . Drug use: No  . Sexual activity: Yes  Other Topics Concern  . 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    Married for 49 years    Three children and 4 grandchildren.    Social Determinants of Health   Financial Resource Strain: Not on file  Food Insecurity: Not on file  Transportation Needs: Not on file  Physical Activity: Not on file  Stress: Not on file  Social Connections: Not on file    There were no vitals filed for this visit. There is no height or weight on file to calculate BMI.  Physical Exam  ASSESSMENT AND PLAN:  There are no diagnoses linked to this encounter.   No follow-ups on file.   Leonora Gores G. [place], MD  [facility]. [place] office.  Discharge Instructions   None

## 2021-10-17 ENCOUNTER — Encounter: Payer: Self-pay | Admitting: Family Medicine

## 2021-10-17 ENCOUNTER — Ambulatory Visit (INDEPENDENT_AMBULATORY_CARE_PROVIDER_SITE_OTHER): Payer: Medicare Other | Admitting: Family Medicine

## 2021-10-17 VITALS — BP 128/70 | HR 64 | Temp 97.5°F | Resp 16 | Ht 70.0 in | Wt 193.2 lb

## 2021-10-17 DIAGNOSIS — J069 Acute upper respiratory infection, unspecified: Secondary | ICD-10-CM | POA: Diagnosis not present

## 2021-10-17 DIAGNOSIS — J302 Other seasonal allergic rhinitis: Secondary | ICD-10-CM

## 2021-10-17 MED ORDER — BENZONATATE 100 MG PO CAPS: 100.0000 mg | ORAL_CAPSULE | Freq: Two times a day (BID) | ORAL | 0 refills | Status: AC | PRN

## 2021-10-17 MED ORDER — IPRATROPIUM BROMIDE 0.06 % NA SOLN: 2.0000 | Freq: Four times a day (QID) | NASAL | 0 refills | Status: DC

## 2021-10-17 NOTE — Patient Instructions (Addendum)
A few things to remember from today's visit:  URI, acute - Plan: ipratropium (ATROVENT) 0.06 % nasal spray, benzonatate (TESSALON) 100 MG capsule  Seasonal allergies  If you need refills please call your pharmacy. Do not use My Chart to request refills or for acute issues that need immediate attention.   Symptoms are improving. Nasal saline irrigations as needed. Atrovent nasal spray for nasal congestion. Plain Mucinex may also help. Avoid nasal afrin. If symptoms are worse we will need a chest X ray.  Please be sure medication list is accurate. If a new problem present, please set up appointment sooner than planned today.

## 2021-10-30 DIAGNOSIS — C44319 Basal cell carcinoma of skin of other parts of face: Secondary | ICD-10-CM | POA: Diagnosis not present

## 2021-10-30 DIAGNOSIS — L821 Other seborrheic keratosis: Secondary | ICD-10-CM | POA: Diagnosis not present

## 2021-10-30 DIAGNOSIS — D485 Neoplasm of uncertain behavior of skin: Secondary | ICD-10-CM | POA: Diagnosis not present

## 2021-10-30 DIAGNOSIS — D1801 Hemangioma of skin and subcutaneous tissue: Secondary | ICD-10-CM | POA: Diagnosis not present

## 2021-10-30 DIAGNOSIS — D2262 Melanocytic nevi of left upper limb, including shoulder: Secondary | ICD-10-CM | POA: Diagnosis not present

## 2021-10-30 DIAGNOSIS — D692 Other nonthrombocytopenic purpura: Secondary | ICD-10-CM | POA: Diagnosis not present

## 2021-10-30 DIAGNOSIS — L718 Other rosacea: Secondary | ICD-10-CM | POA: Diagnosis not present

## 2021-10-30 DIAGNOSIS — D225 Melanocytic nevi of trunk: Secondary | ICD-10-CM | POA: Diagnosis not present

## 2021-10-30 DIAGNOSIS — D2371 Other benign neoplasm of skin of right lower limb, including hip: Secondary | ICD-10-CM | POA: Diagnosis not present

## 2021-10-30 DIAGNOSIS — D2261 Melanocytic nevi of right upper limb, including shoulder: Secondary | ICD-10-CM | POA: Diagnosis not present

## 2021-10-30 DIAGNOSIS — Z85828 Personal history of other malignant neoplasm of skin: Secondary | ICD-10-CM | POA: Diagnosis not present

## 2021-12-04 ENCOUNTER — Telehealth: Payer: Self-pay | Admitting: Internal Medicine

## 2021-12-04 NOTE — Telephone Encounter (Signed)
Patient stopped by and dropped off paperwork that needs to be completed. Paperwork placed in folder.      FYI

## 2021-12-05 NOTE — Telephone Encounter (Signed)
Placed in Dr Hernandez's folder 

## 2021-12-10 ENCOUNTER — Ambulatory Visit (INDEPENDENT_AMBULATORY_CARE_PROVIDER_SITE_OTHER): Payer: Medicare Other | Admitting: Orthopaedic Surgery

## 2021-12-10 ENCOUNTER — Ambulatory Visit (INDEPENDENT_AMBULATORY_CARE_PROVIDER_SITE_OTHER): Payer: Medicare Other

## 2021-12-10 ENCOUNTER — Telehealth: Payer: Self-pay

## 2021-12-10 DIAGNOSIS — M25561 Pain in right knee: Secondary | ICD-10-CM

## 2021-12-10 NOTE — Telephone Encounter (Signed)
Patient called in stating that he fell on his R knee while working out. It is very swollen and he would like to know if he can be fit in on the schedule

## 2021-12-10 NOTE — Progress Notes (Signed)
The patient is well-known to me.  We have replaced both of his knees with the right knee being done in June 2019.  He is 81 years old.  He was working out and exercising on Saturday when he fell onto his right knee.  He has had some swelling since then and it does hurt but he said the pain is not extreme and he has not needing an assistive device to walk.  He did workout today.  He said he has not had any swelling prior to this fall in the right knee.  Examination of both knees today shows some slight swelling of the right knee but not of the left.  Its not really warm.  There is no redness.  His right knee extensor mechanism is intact and the knee feels ligamentously stable.  3 views of the right knee show well-seated total knee arthroplasty with no complicating features.  There is no acute findings no fracture.  I gave him reassurance that this should get better as time goes by.  He agrees as well and just wanted to make sure that there was nothing going on given his recent fall in terms of a break.  All questions and concerns were answered and addressed.  Follow-up is as needed.

## 2021-12-11 NOTE — Telephone Encounter (Signed)
Form faxed and confirmed

## 2021-12-12 ENCOUNTER — Other Ambulatory Visit: Payer: Self-pay | Admitting: *Deleted

## 2021-12-12 DIAGNOSIS — E782 Mixed hyperlipidemia: Secondary | ICD-10-CM

## 2021-12-12 DIAGNOSIS — E7800 Pure hypercholesterolemia, unspecified: Secondary | ICD-10-CM

## 2021-12-12 DIAGNOSIS — I10 Essential (primary) hypertension: Secondary | ICD-10-CM

## 2021-12-13 ENCOUNTER — Ambulatory Visit: Payer: Medicare Other | Attending: Cardiovascular Disease

## 2021-12-13 DIAGNOSIS — E782 Mixed hyperlipidemia: Secondary | ICD-10-CM | POA: Insufficient documentation

## 2021-12-13 DIAGNOSIS — E7800 Pure hypercholesterolemia, unspecified: Secondary | ICD-10-CM | POA: Insufficient documentation

## 2021-12-13 DIAGNOSIS — I10 Essential (primary) hypertension: Secondary | ICD-10-CM | POA: Diagnosis not present

## 2021-12-13 LAB — HEPATIC FUNCTION PANEL
ALT: 28 IU/L (ref 0–44)
AST: 24 IU/L (ref 0–40)
Albumin: 3.7 g/dL — ABNORMAL LOW (ref 3.8–4.8)
Alkaline Phosphatase: 64 IU/L (ref 44–121)
Bilirubin Total: 1.3 mg/dL — ABNORMAL HIGH (ref 0.0–1.2)
Bilirubin, Direct: 0.31 mg/dL (ref 0.00–0.40)
Total Protein: 6 g/dL (ref 6.0–8.5)

## 2021-12-13 LAB — LIPID PANEL
Chol/HDL Ratio: 2.5 ratio (ref 0.0–5.0)
Cholesterol, Total: 191 mg/dL (ref 100–199)
HDL: 76 mg/dL (ref >39)
LDL Chol Calc (NIH): 97 mg/dL (ref 0–99)
Triglycerides: 104 mg/dL (ref 0–149)
VLDL Cholesterol Cal: 18 mg/dL (ref 5–40)

## 2021-12-13 LAB — BASIC METABOLIC PANEL
BUN/Creatinine Ratio: 18 (ref 10–24)
BUN: 21 mg/dL (ref 8–27)
CO2: 29 mmol/L (ref 20–29)
Calcium: 9.5 mg/dL (ref 8.6–10.2)
Chloride: 101 mmol/L (ref 96–106)
Creatinine, Ser: 1.19 mg/dL (ref 0.76–1.27)
Glucose: 123 mg/dL — ABNORMAL HIGH (ref 70–99)
Potassium: 4.2 mmol/L (ref 3.5–5.2)
Sodium: 138 mmol/L (ref 134–144)
eGFR: 62 mL/min/{1.73_m2} (ref >59)

## 2021-12-17 ENCOUNTER — Encounter: Payer: Self-pay | Admitting: Cardiovascular Disease

## 2021-12-17 NOTE — Progress Notes (Signed)
CARDIOLOGY OFFICE NOTE  Date:  12/18/2021    Carlos Juarez Date of Birth: [birth year] Medical Record #[id number]  PCP:  Carlos Juarez, Carlos Halsted, MD  Cardiologist:  Former patient of Dr. Sherryl Juarez  , now [place]   Chief Complaint  Patient presents with   Hypertension        Hyperlipidemia   Problem list 1. Essential hypertension 2. Hyperlipidemia 3. Hypothyroidism   Notes from Carlos Juarez.  Carlos Juarez is a 81 y.o. male who presents today for a 4 month check. Former patient of Dr. Mare Juarez.   He has a history of essential hypertension and a history of hypercholesterolemia. He does not have any history of ischemic heart disease. He had a normal treadmill Cardiolite stress test on 07/24/05 which showed no ischemia and his ejection fraction was 57% with no wall motion abnormalities. He also has a history of hypothyroid disease.   Last seen back in November - cardiac status was felt to be stable. Had had a fall.   Comes back today. Here alone. Remains on NSAID. BP high here today but says he has good control at home. Asking for his Xanax refill - would defer to PCP. He had his labs done earlier this month - those are reviewed. Blood sugar creeping up. He says he is walking most days but knows he needs to lose weight. No chest pain. Breathing is good.   Sept. 21, 2017:  Carlos Juarez is seen today for the first time.  Transfer from [place] .  BP is normal at home.   Is elevated here.  No CP or dyspnea  Works out with a Clinical research associate.  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   July 03, 2016   Doing well from a cardiac standpoint. Had carpal tunnel surgery on his right hand several weeks ago. Seems to be healing up nicely. His blood pressure and heart rate are well controlled.  He's back working out with his trainer on a regular basis. His blood pressure at home is very well-controlled. His blood pressure here  today in the office is slightly elevated. Weight and chol levels have increased    Jan 14 2017: Carlos Juarez is seen today for follow up visit . BP is elevated today here in the office.   BP readings at home look good.  Avoids salt Has started back exercise.   Had back surgery several months ,  Feeling better.  No further hip pain .   Oct. 2, 2019: Doing well from a cardiac standpoint  Has had bilateral knee replacements Carlos Juarez )   Oct. 1, 2020:  Carlos Juarez is see for follow up of his hypertension and hyperlipidemia. Doing well Exercises regularly  works out with trainer twice a week, rowing machine    Sept. 3, 2021:  Carlos Juarez is seen today for follow up of his HTN, HLD, Still very active BP is typcially well controlled.   Is retired as a Education officer, museum   No cp , no dyspnea  Exercises daily  bp has been well controlled.   Sept. 7, 2022: Carlos Juarez is seen today for follow up of his HTN and HLD  Exercises regularly ,  No CP or dyspnea.   , no syncope  Lipids from last week were reviewed.   Sept. 5, 2023 Carlos Juarez is seen for follow up of his HTN, HLD Has had 2 dizzy spells - c/w orthostasis When he  goes from sitting to standing .  Will reduce his atenolol from 50 QD to 25  QD Will see him in 6 months,  will consider stopping atenolol at that time     Past Medical History:  Diagnosis Date   Arthritis    Cancer ([place])    skin cancer removed rom nose non melanoma   HTN (hypertension)    Hypercholesteremia    Hypothyroidism    Pneumonia    PVC's (premature ventricular contractions)    Synovial cyst    Trigger middle finger    right    Past Surgical History:  Procedure Laterality Date   CARPAL TUNNEL RELEASE     HERNIA REPAIR     as a child r inguinal   JOINT REPLACEMENT     TKA Dr. Ninfa Juarez 03/28/17   LUMBAR LAMINECTOMY/DECOMPRESSION MICRODISCECTOMY N/A 12/25/2016   Procedure: Laminectomy and Foraminotomy - Lumbar two-Lumbar three - Lumbar three-Lumbar  four, resection of synovial cyst;  Surgeon: Carlos Moore, MD;  Location: [place];  Service: Neurosurgery;  Laterality: N/A;   MENISCUS REPAIR Left    STERIOD INJECTION Right 10/07/2017   Procedure: RIGHT MIDDLE TRIGGER FINGER INJECTION;  Surgeon: Carlos Rossetti, MD;  Location: [place];  Service: Orthopedics;  Laterality: Right;   synovial cyst removed     12/25/16   TONSILLECTOMY     TOTAL KNEE ARTHROPLASTY Left 03/28/2017   Procedure: LEFT TOTAL KNEE ARTHROPLASTY;  Surgeon: Carlos Rossetti, MD;  [facility];  Service: Orthopedics;  Laterality: Left;   TOTAL KNEE ARTHROPLASTY Right 10/07/2017   Procedure: RIGHT TOTAL KNEE ARTHROPLASTY;  Surgeon: Carlos Rossetti, MD;  Location: [place];  Service: Orthopedics;  Laterality: Right;     Medications: Current Outpatient Medications  Medication Sig Dispense Refill   atenolol (TENORMIN) 25 MG tablet Take 1 tablet (25 mg total) by mouth 2 (two) times daily. 90 tablet 3   ezetimibe (ZETIA) 10 MG tablet Take 1 tablet (10 mg total) by mouth daily. 90 tablet 3   levothyroxine (SYNTHROID) 125 MCG tablet Take 1 tablet (125 mcg total) by mouth daily. 90 tablet 1   rosuvastatin (CRESTOR) 10 MG tablet Take 1 tablet (10 mg total) by mouth daily. 90 tablet 3   ipratropium (ATROVENT) 0.06 % nasal spray Place 2 sprays into both nostrils 4 (four) times daily. (Patient not taking: Reported on 12/18/2021) 15 mL 0   oxymetazoline (AFRIN) 0.05 % nasal spray Place 1 spray into both nostrils 2 (two) times daily as needed for congestion. (Patient not taking: Reported on 12/18/2021)     No current facility-administered medications for this visit.    Allergies: Allergies  Allergen Reactions   Epinephrine Other (See Comments)    Light headedness   Other Anxiety    Other reaction(s): Anxiety, OTHER REACTION    Social History: The patient  reports that he quit smoking about 47 years ago. His smoking use included cigarettes. He has a 15.00  pack-year smoking history. He has never used smokeless tobacco. He reports current alcohol use. He reports that he does not use drugs.   Family History: The patient's family history includes Heart attack (age of onset: 29) in his father; Heart disease in his father; Hypertension in his mother.   Review of Systems: Please see the history of present illness.   Otherwise, the review of systems is positive for none.   All other systems are reviewed and negative.    Physical Exam: Blood pressure 126/78, pulse Marland Kitchen)  46, height '5\' 10"'$  (1.778 m), weight 195 lb 3.2 oz (88.5 kg), SpO2 96 %.    GEN:  Well nourished, well developed in no acute distress HEENT: Normal NECK: No JVD; No carotid bruits LYMPHATICS: No lymphadenopathy CARDIAC: RRR , no murmurs, rubs, gallops RESPIRATORY:  Clear to auscultation without rales, wheezing or rhonchi  ABDOMEN: Soft, non-tender, non-distended MUSCULOSKELETAL:  No edema; No deformity  SKIN: Warm and dry NEUROLOGIC:  Alert and oriented x 3     LABORATORY DATA:  EKG:    Sept. 5, 2023 .      Sinus brady 46.  ,  No ST or T wave changes.  Lab Results  Component Value Date   WBC 9.8 10/08/2017   HGB 13.8 10/08/2017   HCT 41.2 10/08/2017   PLT 174 10/08/2017   GLUCOSE 123 (H) 12/13/2021   CHOL 191 12/13/2021   TRIG 104 12/13/2021   HDL 76 12/13/2021   LDLDIRECT 103.7 01/03/2012   LDLCALC 97 12/13/2021   ALT 28 12/13/2021   AST 24 12/13/2021   NA 138 12/13/2021   K 4.2 12/13/2021   CL 101 12/13/2021   CREATININE 1.19 12/13/2021   BUN 21 12/13/2021   CO2 29 12/13/2021   TSH 0.55 10/24/2020   INR 0.98 12/25/2016    BNP (last 3 results) No results for input(s): "BNP" in the last 8760 hours.  ProBNP (last 3 results) No results for input(s): "PROBNP" in the last 8760 hours.   Other Studies Reviewed Today:   Assessment/Plan: 1.  Essential Hypertension:  Blood pressure is well controlled.  His heart rate is fairly slow.  He is having some  symptoms of orthostatic hypotension.  We will reduce the atenolol from 50 mg a day to 25 mg a day.  I will consider discontinuing it completely when I see him again in 6 months.    2. Hypercholesterolemia -he is on simvastatin/Zetia.  We will change this to Zetia 10 mg and rosuvastatin 10 mg a day.  We will check lipids again in 6 months.     4. Hypothyroidism -    Labs/ tests ordered today include:    Orders Placed This Encounter  Procedures   Lipid panel   ALT   EKG 12-Lead     Mertie Moores, MD  12/18/2021 5:52 PM    [facility] [address],  [place], [place]  [address] Pager [phone] Phone: [phone]; Fax: [phone]

## 2021-12-18 ENCOUNTER — Ambulatory Visit: Payer: Medicare Other | Attending: Cardiovascular Disease | Admitting: Cardiovascular Disease

## 2021-12-18 ENCOUNTER — Encounter: Payer: Self-pay | Admitting: Cardiovascular Disease

## 2021-12-18 ENCOUNTER — Telehealth: Payer: Self-pay | Admitting: Cardiovascular Disease

## 2021-12-18 VITALS — BP 126/78 | HR 46 | Ht 70.0 in | Wt 195.2 lb

## 2021-12-18 DIAGNOSIS — I10 Essential (primary) hypertension: Secondary | ICD-10-CM | POA: Insufficient documentation

## 2021-12-18 DIAGNOSIS — E7800 Pure hypercholesterolemia, unspecified: Secondary | ICD-10-CM | POA: Insufficient documentation

## 2021-12-18 DIAGNOSIS — I951 Orthostatic hypotension: Secondary | ICD-10-CM | POA: Diagnosis not present

## 2021-12-18 MED ORDER — ROSUVASTATIN CALCIUM 10 MG PO TABS: 10.0000 mg | ORAL_TABLET | Freq: Every day | ORAL | 3 refills | Status: DC

## 2021-12-18 MED ORDER — EZETIMIBE 10 MG PO TABS: 10.0000 mg | ORAL_TABLET | Freq: Every day | ORAL | 3 refills | Status: DC

## 2021-12-18 MED ORDER — ATENOLOL 25 MG PO TABS: 25.0000 mg | ORAL_TABLET | Freq: Two times a day (BID) | ORAL | 3 refills | Status: DC

## 2021-12-18 NOTE — Patient Instructions (Signed)
  Medication Instructions:  DECREASE Atenolol to 25 mg daily STOP Vytorin (Ezetimibe/Simvastatin) START Zetia '10mg'$  daily (Ezetimibe) START Rosuvastatin '10mg'$  daily *If you need a refill on your cardiac medications before your next appointment, please call your pharmacy*   Lab Work: Lipids, ALT in 6 months If you have labs (blood work) drawn today and your tests are completely normal, you will receive your results only by: [place] (if you have MyChart) OR A paper copy in the mail If you have any lab test that is abnormal or we need to change your treatment, we will call you to review the results.   Testing/Procedures: NONE   Follow-Up: At [facility], you and your health needs are our priority.  As part of our continuing mission to provide you with exceptional heart care, we have created designated Provider Care Teams.  These Care Teams include your primary Cardiologist (physician) and Advanced Practice Providers (APPs -  Physician Assistants and Nurse Practitioners) who all work together to provide you with the care you need, when you need it.  We recommend signing up for the patient portal called "MyChart".  Sign up information is provided on this After Visit Summary.  MyChart is used to connect with patients for Virtual Visits (Telemedicine).  Patients are able to view lab/test results, encounter notes, upcoming appointments, etc.  Non-urgent messages can be sent to your provider as well.   To learn more about what you can do with MyChart, go to [email or web address].    Your next appointment:   6 month(s)  The format for your next appointment:   In Person  Provider:   Mertie Moores, MD       Important Information About Sugar

## 2021-12-18 NOTE — Telephone Encounter (Signed)
Pt c/o medication issue:  1. Name of Medication: atenolol (TENORMIN) 25 MG tablet  2. How are you currently taking this medication (dosage and times per day)? Take 1 tablet (25 mg total) by mouth daily.  3. Are you having a reaction (difficulty breathing--STAT)? No   4. What is your medication issue? Patient called and said that pharmacy got the medication messed up about the mg and dosage. Please call back to discuss

## 2021-12-19 MED ORDER — ATENOLOL 25 MG PO TABS: 25.0000 mg | ORAL_TABLET | Freq: Every day | ORAL | 3 refills | Status: AC

## 2021-12-19 NOTE — Telephone Encounter (Signed)
Patient calling back for update. Please advise  

## 2021-12-19 NOTE — Telephone Encounter (Signed)
Per OV note on 12/18/21: Has had 2 dizzy spells - c/w orthostasis When he goes from sitting to standing .  Will reduce his atenolol from 50 QD to 25  QD Will see him in 6 months,  will consider stopping atenolol at that time   Returned call to patient. Rx yesterday was sent in with BID dosing, not QD. Apologized to patient who stated he understood, laughed, and said it was perfectly fine. He will use up the tablets he has on hand and pick up the corrected RX which I sent in today. He knows that he should only be taking '25mg'$  daily.

## 2022-01-25 DIAGNOSIS — Z23 Encounter for immunization: Secondary | ICD-10-CM | POA: Diagnosis not present

## 2022-02-27 DIAGNOSIS — Z23 Encounter for immunization: Secondary | ICD-10-CM | POA: Diagnosis not present

## 2022-04-22 DIAGNOSIS — Z961 Presence of intraocular lens: Secondary | ICD-10-CM | POA: Diagnosis not present

## 2022-04-22 DIAGNOSIS — H524 Presbyopia: Secondary | ICD-10-CM | POA: Diagnosis not present

## 2022-04-22 DIAGNOSIS — H52201 Unspecified astigmatism, right eye: Secondary | ICD-10-CM | POA: Diagnosis not present

## 2022-06-18 ENCOUNTER — Ambulatory Visit: Payer: Medicare Other | Admitting: Cardiovascular Disease

## 2022-06-26 ENCOUNTER — Other Ambulatory Visit: Payer: Medicare Other

## 2022-06-27 ENCOUNTER — Ambulatory Visit: Payer: Medicare Other | Attending: Cardiovascular Disease

## 2022-06-27 DIAGNOSIS — I10 Essential (primary) hypertension: Secondary | ICD-10-CM

## 2022-06-27 DIAGNOSIS — E7800 Pure hypercholesterolemia, unspecified: Secondary | ICD-10-CM

## 2022-06-27 DIAGNOSIS — I951 Orthostatic hypotension: Secondary | ICD-10-CM

## 2022-06-27 LAB — LIPID PANEL
Chol/HDL Ratio: 2.2 ratio (ref 0.0–5.0)
Cholesterol, Total: 180 mg/dL (ref 100–199)
HDL: 82 mg/dL (ref >39)
LDL Chol Calc (NIH): 79 mg/dL (ref 0–99)
Triglycerides: 109 mg/dL (ref 0–149)
VLDL Cholesterol Cal: 19 mg/dL (ref 5–40)

## 2022-06-27 LAB — ALT: ALT: 26 IU/L (ref 0–44)

## 2022-07-01 ENCOUNTER — Encounter: Payer: Self-pay | Admitting: Cardiovascular Disease

## 2022-07-01 NOTE — Progress Notes (Signed)
CARDIOLOGY OFFICE NOTE  Date:  07/11/2022    [name] Date of Birth: 1941-01-22 Medical Record [id number]  PCP:  Isaac Bliss, Rayford Halsted, MD  Cardiologist:  Former patient of Dr. Sherryl Barters  , now [place]   Chief Complaint  Patient presents with   Hypertension        Hyperlipidemia   Problem list 1. Essential hypertension 2. Hyperlipidemia 3. Hypothyroidism   Notes from Carlos Juarez.  Carlos Juarez is a 82 y.o. male who presents today for a 4 month check. Former patient of Dr. Mare Ferrari.   He has a history of essential hypertension and a history of hypercholesterolemia. He does not have any history of ischemic heart disease. He had a normal treadmill Cardiolite stress test on 07/24/05 which showed no ischemia and his ejection fraction was 57% with no wall motion abnormalities. He also has a history of hypothyroid disease.   Last seen back in November - cardiac status was felt to be stable. Had had a fall.   Comes back today. Here alone. Remains on NSAID. BP high here today but says he has good control at home. Asking for his Xanax refill - would defer to PCP. He had his labs done earlier this month - those are reviewed. Blood sugar creeping up. He says he is walking most days but knows he needs to lose weight. No chest pain. Breathing is good.   Sept. 21, 2017:  Carlos Juarez is seen today for the first time.  Transfer from [place] .  BP is normal at home.   Is elevated here.  No CP or dyspnea  Works out with a Clinical research associate.  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   July 03, 2016   Doing well from a cardiac standpoint. Had carpal tunnel surgery on his right hand several weeks ago. Seems to be healing up nicely. His blood pressure and heart rate are well controlled.  He's back working out with his trainer on a regular basis. His blood pressure at home is very well-controlled. His blood pressure here  today in the office is slightly elevated. Weight and chol levels have increased    Jan 14 2017: Carlos. Juarez is seen today for follow up visit . BP is elevated today here in the office.   BP readings at home look good.  Avoids salt Has started back exercise.   Had back surgery several months ,  Feeling better.  No further hip pain .   Oct. 2, 2019: Doing well from a cardiac standpoint  Has had bilateral knee replacements [name] )   Oct. 1, 2020:  Carlos Juarez is see for follow up of his hypertension and hyperlipidemia. Doing well Exercises regularly  works out with trainer twice a week, rowing machine    Sept. 3, 2021:  Carlos Juarez is seen today for follow up of his HTN, HLD, Still very active BP is typcially well controlled.   Is retired as a Education officer, museum   No cp , no dyspnea  Exercises daily  bp has been well controlled.   Sept. 7, 2022: Carlos Juarez is seen today for follow up of his HTN and HLD  Exercises regularly ,  No CP or dyspnea.   , no syncope  Lipids from last week were reviewed.   Sept. 5, 2023 Carlos Juarez is seen for follow up of his HTN, HLD Has had 2 dizzy spells - c/w orthostasis When he  goes from sitting to standing .  Will reduce his atenolol from 50 QD to 25  QD Will see him in 6 months,  will consider stopping atenolol at that time   July 02, 2022: Carlos Juarez is  seen today for follow-up visit.  He has a history of hypertension and hyperlipidemia.  He had some bradycardia.  We reduced his atenolol from 50 mg to 25 mg during his last visit.  Will consider stopping it completely if he continues to be bradycardic. His HR is typically in the 50-60   Feels well  Has but on a few pounds - has been eating more desserts now that they have moved to [facility] is [address] Moved out to [facility] [place]  No CP or dyspnea,  still exercising  HR appears stable   Past Medical History:  Diagnosis Date   Arthritis    Cancer ([place])    skin cancer removed rom  nose non melanoma   HTN (hypertension)    Hypercholesteremia    Hypothyroidism    Pneumonia    PVC's (premature ventricular contractions)    Synovial cyst    Trigger middle finger    right    Past Surgical History:  Procedure Laterality Date   CARPAL TUNNEL RELEASE     HERNIA REPAIR     as a child r inguinal   JOINT REPLACEMENT     TKA Dr. [name] 03/28/17   LUMBAR LAMINECTOMY/DECOMPRESSION MICRODISCECTOMY N/A 12/25/2016   Procedure: Laminectomy and Foraminotomy - Lumbar two-Lumbar three - Lumbar three-Lumbar four, resection of synovial cyst;  Surgeon: Eustace Moore, MD;  Location: [place];  Service: Neurosurgery;  Laterality: N/A;   MENISCUS REPAIR Left    STERIOD INJECTION Right 10/07/2017   Procedure: RIGHT MIDDLE TRIGGER FINGER INJECTION;  Surgeon: Mcarthur Rossetti, MD;  Location: [place];  Service: Orthopedics;  Laterality: Right;   synovial cyst removed     12/25/16   TONSILLECTOMY     TOTAL KNEE ARTHROPLASTY Left 03/28/2017   Procedure: LEFT TOTAL KNEE ARTHROPLASTY;  Surgeon: Mcarthur Rossetti, MD;  Location: [facility];  Service: Orthopedics;  Laterality: Left;   TOTAL KNEE ARTHROPLASTY Right 10/07/2017   Procedure: RIGHT TOTAL KNEE ARTHROPLASTY;  Surgeon: Mcarthur Rossetti, MD;  Location: [place];  Service: Orthopedics;  Laterality: Right;     Medications: Current Outpatient Medications  Medication Sig Dispense Refill   atenolol (TENORMIN) 25 MG tablet Take 1 tablet (25 mg total) by mouth daily. 90 tablet 3   ezetimibe (ZETIA) 10 MG tablet Take 1 tablet (10 mg total) by mouth daily. 90 tablet 3   levothyroxine (SYNTHROID) 125 MCG tablet Take 1 tablet (125 mcg total) by mouth daily. 90 tablet 1   rosuvastatin (CRESTOR) 10 MG tablet Take 1 tablet (10 mg total) by mouth daily. 90 tablet 3   No current facility-administered medications for this visit.    Allergies: Allergies  Allergen Reactions   Epinephrine Other (See Comments)    Light headedness   Other  Anxiety    Other reaction(s): Anxiety, OTHER REACTION    Social History: The patient  reports that he quit smoking about 48 years ago. His smoking use included cigarettes. He has a 15.00 pack-year smoking history. He has never used smokeless tobacco. He reports current alcohol use. He reports that he does not use drugs.   Family History: The patient's family history includes Heart attack (age of onset: 54) in his father; Heart disease in his  father; Hypertension in his mother.   Review of Systems: Please see the history of present illness.   Otherwise, the review of systems is positive for none.   All other systems are reviewed and negative.    Physical Exam: Blood pressure 125/88, pulse 90, height 5\' 11"  (1.803 m), weight 199 lb 12.8 oz (90.6 kg), SpO2 98 %.     GEN:  Well nourished, well developed in no acute distress HEENT: Normal NECK: No JVD; No carotid bruits LYMPHATICS: No lymphadenopathy CARDIAC: RRR , no murmurs, rubs, gallops RESPIRATORY:  Clear to auscultation without rales, wheezing or rhonchi  ABDOMEN: Soft, non-tender, non-distended MUSCULOSKELETAL:  No edema; No deformity  SKIN: Warm and dry NEUROLOGIC:  Alert and oriented x 3     LABORATORY DATA:  EKG:      Lab Results  Component Value Date   WBC 9.8 10/08/2017   HGB 13.8 10/08/2017   HCT 41.2 10/08/2017   PLT 174 10/08/2017   GLUCOSE 123 (H) 12/13/2021   CHOL 180 06/27/2022   TRIG 109 06/27/2022   HDL 82 06/27/2022   LDLDIRECT 103.7 01/03/2012   LDLCALC 79 06/27/2022   ALT 26 06/27/2022   AST 24 12/13/2021   NA 138 12/13/2021   K 4.2 12/13/2021   CL 101 12/13/2021   CREATININE 1.19 12/13/2021   BUN 21 12/13/2021   CO2 29 12/13/2021   TSH 0.55 10/24/2020   INR 0.98 12/25/2016    BNP (last 3 results) No results for input(s): "BNP" in the last 8760 hours.  ProBNP (last 3 results) No results for input(s): "PROBNP" in the last 8760 hours.   Other Studies Reviewed  Today:   Assessment/Plan: 1.  Essential Hypertension:   BP is well controlled.   Diastolic is a bit higher than usual.  I suspect this is due to his weight gain .  I've recommended more exercise, fewer desserts.  Continue current meds   2. Hypercholesterolemia -  LDL is 79  Overall his lipids look good Weight loss will help   Will see him in a year    4. Hypothyroidism -  Labs/ tests ordered today include:    No orders of the defined types were placed in this encounter.    Mertie Moores, MD  07/11/2022 3:14 PM    [address],  [address] [place], [place]  [id number] Pager [phone] Phone: [phone]; Fax: [phone]

## 2022-07-02 ENCOUNTER — Ambulatory Visit: Payer: Medicare Other | Attending: Cardiovascular Disease | Admitting: Cardiovascular Disease

## 2022-07-02 ENCOUNTER — Encounter: Payer: Self-pay | Admitting: Cardiovascular Disease

## 2022-07-02 VITALS — BP 125/88 | HR 90 | Ht 71.0 in | Wt 199.8 lb

## 2022-07-02 DIAGNOSIS — I10 Essential (primary) hypertension: Secondary | ICD-10-CM | POA: Diagnosis not present

## 2022-07-02 DIAGNOSIS — E782 Mixed hyperlipidemia: Secondary | ICD-10-CM | POA: Diagnosis not present

## 2022-07-02 NOTE — Patient Instructions (Signed)
Medication Instructions:  Your physician recommends that you continue on your current medications as directed. Please refer to the Current Medication list given to you today.  *If you need a refill on your cardiac medications before your next appointment, please call your pharmacy*   Lab Work: NONE If you have labs (blood work) drawn today and your tests are completely normal, you will receive your results only by: MyChart Message (if you have MyChart) OR A paper copy in the mail If you have any lab test that is abnormal or we need to change your treatment, we will call you to review the results.   Testing/Procedures: NONE   Follow-Up: At [facility], you and your health needs are our priority.  As part of our continuing mission to provide you with exceptional heart care, we have created designated Provider Care Teams.  These Care Teams include your primary Cardiologist (physician) and Advanced Practice Providers (APPs -  Physician Assistants and Nurse Practitioners) who all work together to provide you with the care you need, when you need it.  Your next appointment:   1 year(s)  Provider:   Philip Nahser, MD      

## 2022-09-25 ENCOUNTER — Ambulatory Visit (INDEPENDENT_AMBULATORY_CARE_PROVIDER_SITE_OTHER): Payer: Medicare Other | Admitting: Orthopaedic Surgery

## 2022-09-25 ENCOUNTER — Encounter: Payer: Self-pay | Admitting: Orthopaedic Surgery

## 2022-09-25 DIAGNOSIS — S46212D Strain of muscle, fascia and tendon of other parts of biceps, left arm, subsequent encounter: Secondary | ICD-10-CM | POA: Diagnosis not present

## 2022-09-25 MED ORDER — METHOCARBAMOL 500 MG PO TABS: 500.0000 mg | ORAL_TABLET | Freq: Three times a day (TID) | ORAL | 1 refills | Status: AC | PRN

## 2022-09-25 MED ORDER — CELECOXIB 200 MG PO CAPS: 200.0000 mg | ORAL_CAPSULE | Freq: Two times a day (BID) | ORAL | 1 refills | Status: DC | PRN

## 2022-09-25 NOTE — Progress Notes (Signed)
The patient is an 82 year old gentleman well-known to me.  He comes in today due to a left proximal biceps tendon rupture that happened about 3 months ago.  This happened sometime ago for his right shoulder and he became asymptomatic and it still never bothersome.  However left shoulder and upper arm especially are still hurting him after this rupture.  He does work with a Systems analyst.  He has a remote history of a proximal humerus fracture that occurred in the 80s.  On exam both proximal biceps tendons have ruptured.  There is no bruising on either side but the left side is symptomatic in terms of pain along the course of the biceps tendon itself.  There is decent range of motion of his left shoulder but some of this is limited by his chronic injury.  I would like to try Celebrex and methocarbamol for him.  If this does not help calm things down I would like to send him to Dr. Shon Baton for an assessment and considering even ultrasound or Sonorex to this area.  He will let us know.  All questions and concerns were answered and addressed.

## 2022-09-30 ENCOUNTER — Ambulatory Visit: Payer: Medicare Other | Admitting: Orthopaedic Surgery

## 2022-10-30 DIAGNOSIS — D2262 Melanocytic nevi of left upper limb, including shoulder: Secondary | ICD-10-CM | POA: Diagnosis not present

## 2022-10-30 DIAGNOSIS — L821 Other seborrheic keratosis: Secondary | ICD-10-CM | POA: Diagnosis not present

## 2022-10-30 DIAGNOSIS — Z85828 Personal history of other malignant neoplasm of skin: Secondary | ICD-10-CM | POA: Diagnosis not present

## 2022-10-30 DIAGNOSIS — D225 Melanocytic nevi of trunk: Secondary | ICD-10-CM | POA: Diagnosis not present

## 2022-10-30 DIAGNOSIS — D2261 Melanocytic nevi of right upper limb, including shoulder: Secondary | ICD-10-CM | POA: Diagnosis not present

## 2022-11-12 ENCOUNTER — Other Ambulatory Visit: Payer: Self-pay | Admitting: Cardiovascular Disease

## 2022-11-12 DIAGNOSIS — I10 Essential (primary) hypertension: Secondary | ICD-10-CM

## 2022-11-12 DIAGNOSIS — I951 Orthostatic hypotension: Secondary | ICD-10-CM

## 2022-11-12 DIAGNOSIS — E7800 Pure hypercholesterolemia, unspecified: Secondary | ICD-10-CM

## 2023-01-21 ENCOUNTER — Telehealth: Payer: Self-pay

## 2023-01-21 NOTE — Telephone Encounter (Signed)
[place] at the beach but wants Dr. [name] to know that he is experiencing a lot of left hip pain that comes and goes. I offered him the next available with [facility] and would like to know if theres any way he could possibly be seen next week some time per patients wife or if some pain medication could be sent [place] a pharmacy [place]. CB # [phone]

## 2023-02-03 ENCOUNTER — Ambulatory Visit (INDEPENDENT_AMBULATORY_CARE_PROVIDER_SITE_OTHER): Payer: Medicare Other | Admitting: Physician Assistant

## 2023-02-03 DIAGNOSIS — S76312A Strain of muscle, fascia and tendon of the posterior muscle group at thigh level, left thigh, initial encounter: Secondary | ICD-10-CM

## 2023-02-03 MED ORDER — TRAMADOL HCL 50 MG PO TABS: 50.0000 mg | ORAL_TABLET | Freq: Four times a day (QID) | ORAL | 0 refills | Status: DC | PRN

## 2023-02-03 NOTE — Progress Notes (Signed)
Office Visit Note   Patient: Carlos Juarez           Date of Birth: 1941-03-05           MRN: [id number] Visit Date: 02/03/2023              Requested by: Carlos Juarez, Carlos Patricia, MD [address] [place],  [place] [address] PCP: Carlos Juarez, Carlos Patricia, MD   Assessment & Plan: Visit Diagnoses:  1. Hamstring strain, left, initial encounter     Plan:  Discussed with him and seen in formal therapy to include dry needling.  He states that he has someone that can do some dry needling for him.  Told him that it is may take several months for it to resolve.  Will follow-up with [place] if pain becomes worse or continues.  He did ask for a note to cancel his cruise as he is unable to walk for long periods of time due to the pain in the left buttocks region. Tramadol for pain.   Follow-Up Instructions: No follow-ups on file.   Orders:  No orders of the defined types were placed in this encounter.  Meds ordered this encounter  Medications   traMADol (ULTRAM) 50 MG tablet    Sig: Take 1 tablet (50 mg total) by mouth every 6 (six) hours as needed.    Dispense:  30 tablet    Refill:  0      Procedures: No procedures performed   Clinical Data: No additional findings.   Subjective: Chief Complaint  Patient presents with   Left Hip - Pain    HPI Carlos Juarez comes in today for pain in his left buttocks region for the past month.  No known injury.  Pain only occurs whenever he is walking.  Does not awaken him.  He denies any numbness tingling down the leg.  Denies any groin pain.  He points to his left buttocks as the source of his pain.  Pain 6-7 out of 10 at worst.  He is to go on a cruise in the next 2 weeks and is unsure if he will be able to go on the trip due to the pain.   Review of Systems See HPI otherwise negative or noncontributory  Objective: Vital Signs: There were no vitals taken for this visit.  Physical Exam General: Well-developed  well-nourished male no acute distress mood and affect appropriate.  Ambulates without any assistive device. Psych: Alert and oriented x 3 Respirations: Nonlabored  Ortho Exam Bilateral hips: Good range of motion both hips without pain.  Nontender over the trochanteric region bilaterally.  Negative straight leg raise bilaterally. Lower extremities: Bilateral knees good range of motion well-healed surgical incisions no signs of infection.  Left hip tenderness at the origin of the hamstrings on the left only.  Hamstring strength testing reveals 5 out of 5 but with resistance on the left does cause pain at the origin.  Specialty Comments:  No specialty comments available.  Imaging: No results found.   PMFS History: Patient Active Problem List   Diagnosis Date Noted   Orthostatic hypotension 12/18/2021   Status post total knee replacement, right 10/07/2017   Unilateral primary osteoarthritis, right knee 07/14/2017   History of left knee replacement 07/14/2017   Trigger finger, right middle finger 06/09/2017   Carpal tunnel syndrome, left upper limb 06/09/2017   Status post total left knee replacement 03/28/2017   Unilateral primary osteoarthritis, left knee 02/19/2017  Chronic pain of left knee 02/19/2017   S/P lumbar laminectomy 12/25/2016   Cervical spondylosis 08/20/2011   HTN (hypertension)    Hypercholesteremia    Hypothyroidism    PVC's (premature ventricular contractions)    Past Medical History:  Diagnosis Date   Arthritis    Cancer ([facility])    skin cancer removed rom nose non melanoma   HTN (hypertension)    Hypercholesteremia    Hypothyroidism    Pneumonia    PVC's (premature ventricular contractions)    Synovial cyst    Trigger middle finger    right    Family History  Problem Relation Age of Onset   Hypertension Mother    Heart disease Father    Heart attack Father 72    Past Surgical History:  Procedure Laterality Date   CARPAL TUNNEL RELEASE     HERNIA  REPAIR     as a child r inguinal   JOINT REPLACEMENT     TKA Dr. [name] 03/28/17   LUMBAR LAMINECTOMY/DECOMPRESSION MICRODISCECTOMY N/A 12/25/2016   Procedure: Laminectomy and Foraminotomy - Lumbar two-Lumbar three - Lumbar three-Lumbar four, resection of synovial cyst;  Surgeon: Tia Alert, MD;  [facility];  Service: Neurosurgery;  Laterality: N/A;   MENISCUS REPAIR Left    STERIOD INJECTION Right 10/07/2017   Procedure: RIGHT MIDDLE TRIGGER FINGER INJECTION;  Surgeon: Kathryne Hitch, MD;  Location: [facility];  Service: Orthopedics;  Laterality: Right;   synovial cyst removed     12/25/16   TONSILLECTOMY     TOTAL KNEE ARTHROPLASTY Left 03/28/2017   Procedure: LEFT TOTAL KNEE ARTHROPLASTY;  Surgeon: Kathryne Hitch, MD;  [facility]: [facility];  Service: Orthopedics;  Laterality: Left;   TOTAL KNEE ARTHROPLASTY Right 10/07/2017   Procedure: RIGHT TOTAL KNEE ARTHROPLASTY;  Surgeon: Kathryne Hitch, MD;  Location: [facility];  Service: Orthopedics;  Laterality: Right;   Social History   Occupational History   Not on file  Tobacco Use   Smoking status: Former    Current packs/day: 0.00    Average packs/day: 1 pack/day for 15.0 years (15.0 ttl pk-yrs)    Types: Cigarettes    Start date: 07/15/1959    Quit date: 07/15/1974    Years since quitting: 48.5   Smokeless tobacco: Never   Tobacco comments:    smoked heavy 15 years and then quit  Vaping Use   Vaping status: Never Used  Substance and Sexual Activity   Alcohol use: Yes    Alcohol/week: 0.0 standard drinks of alcohol    Comment: Wine with meals at times    Drug use: No   Sexual activity: Yes

## 2023-02-27 ENCOUNTER — Encounter: Payer: Self-pay | Admitting: Internal Medicine

## 2023-02-27 ENCOUNTER — Ambulatory Visit: Payer: Medicare Other | Admitting: Internal Medicine

## 2023-02-27 VITALS — BP 110/80 | HR 57 | Temp 97.8°F | Wt 210.8 lb

## 2023-02-27 DIAGNOSIS — R0981 Nasal congestion: Secondary | ICD-10-CM

## 2023-02-27 DIAGNOSIS — J069 Acute upper respiratory infection, unspecified: Secondary | ICD-10-CM | POA: Diagnosis not present

## 2023-02-27 DIAGNOSIS — J329 Chronic sinusitis, unspecified: Secondary | ICD-10-CM | POA: Diagnosis not present

## 2023-02-27 LAB — POCT INFLUENZA A/B
Influenza A, POC: NEGATIVE
Influenza B, POC: NEGATIVE

## 2023-02-27 LAB — POC COVID19 BINAXNOW: SARS Coronavirus 2 Ag: NEGATIVE

## 2023-02-27 NOTE — Progress Notes (Signed)
Established Patient Office Visit     CC/Reason for Visit: Sinus pressure  HPI: Carlos Juarez is a 82 y.o. male who is coming in today for the above mentioned reasons.  For the past 2 days has been having right maxillary sinus pressure with postnasal drip and rhinorrhea.  No sick travel or recent contacts.   Past Medical/Surgical History: Past Medical History:  Diagnosis Date   Arthritis    Cancer (HCC)    skin cancer removed rom nose non melanoma   HTN (hypertension)    Hypercholesteremia    Hypothyroidism    Pneumonia    PVC's (premature ventricular contractions)    Synovial cyst    Trigger middle finger    right    Past Surgical History:  Procedure Laterality Date   CARPAL TUNNEL RELEASE     HERNIA REPAIR     as a child r inguinal   JOINT REPLACEMENT     TKA Dr. [name] 03/28/17   LUMBAR LAMINECTOMY/DECOMPRESSION MICRODISCECTOMY N/A 12/25/2016   Procedure: Laminectomy and Foraminotomy - Lumbar two-Lumbar three - Lumbar three-Lumbar four, resection of synovial cyst;  Surgeon: Tia Alert, MD;  Location: [facility];  Service: Neurosurgery;  Laterality: N/A;   MENISCUS REPAIR Left    STERIOD INJECTION Right 10/07/2017   Procedure: RIGHT MIDDLE TRIGGER FINGER INJECTION;  Surgeon: Kathryne Hitch, MD;  Location: [facility] OR;  Service: Orthopedics;  Laterality: Right;   synovial cyst removed     12/25/16   TONSILLECTOMY     TOTAL KNEE ARTHROPLASTY Left 03/28/2017   Procedure: LEFT TOTAL KNEE ARTHROPLASTY;  Surgeon: Kathryne Hitch, MD;  Location: [facility] ORS;  Service: Orthopedics;  Laterality: Left;   TOTAL KNEE ARTHROPLASTY Right 10/07/2017   Procedure: RIGHT TOTAL KNEE ARTHROPLASTY;  Surgeon: Kathryne Hitch, MD;  Location: [facility] OR;  Service: Orthopedics;  Laterality: Right;    Social History:  reports that he quit smoking about 48 years ago. His smoking use included cigarettes. He started smoking about 63 years ago. He has a 15 pack-year smoking  history. He has never used smokeless tobacco. He reports current alcohol use. He reports that he does not use drugs.  Allergies: Allergies  Allergen Reactions   Epinephrine Other (See Comments)    Light headedness   Other Anxiety    Other reaction(s): Anxiety, OTHER REACTION    Family History:  Family History  Problem Relation Age of Onset   Hypertension Mother    Heart disease Father    Heart attack Father 103     Current Outpatient Medications:    atenolol (TENORMIN) 25 MG tablet, Take 1 tablet (25 mg total) by mouth daily., Disp: 90 tablet, Rfl: 3   celecoxib (CELEBREX) 200 MG capsule, Take 1 capsule (200 mg total) by mouth 2 (two) times daily between meals as needed., Disp: 60 capsule, Rfl: 1   ezetimibe (ZETIA) 10 MG tablet, Take 1 tablet (10 mg total) by mouth daily., Disp: 90 tablet, Rfl: 3   levothyroxine (SYNTHROID) 125 MCG tablet, Take 1 tablet (125 mcg total) by mouth daily., Disp: 90 tablet, Rfl: 1   methocarbamol (ROBAXIN) 500 MG tablet, Take 1 tablet (500 mg total) by mouth every 8 (eight) hours as needed., Disp: 40 tablet, Rfl: 1   rosuvastatin (CRESTOR) 10 MG tablet, Take 1 tablet (10 mg total) by mouth daily., Disp: 90 tablet, Rfl: 3   traMADol (ULTRAM) 50 MG tablet, Take 1 tablet (50 mg total) by mouth every 6 (six)  hours as needed., Disp: 30 tablet, Rfl: 0  Review of Systems:  Negative unless indicated in HPI.   Physical Exam: Vitals:   02/27/23 1122  BP: 110/80  Pulse: (!) 57  Temp: 97.8 F (36.6 C)  TempSrc: Oral  SpO2: 98%  Weight: 210 lb 12.8 oz (95.6 kg)    Body mass index is 29.4 kg/m.   Physical Exam Vitals reviewed.  Constitutional:      Appearance: Normal appearance.  HENT:     Right Ear: Tympanic membrane, ear canal and external ear normal.     Left Ear: Tympanic membrane, ear canal and external ear normal.     Mouth/Throat:     Mouth: Mucous membranes are moist.     Pharynx: Oropharynx is clear.  Eyes:     Conjunctiva/sclera:  Conjunctivae normal.     Pupils: Pupils are equal, round, and reactive to light.  Cardiovascular:     Rate and Rhythm: Normal rate and regular rhythm.  Pulmonary:     Effort: Pulmonary effort is normal.     Breath sounds: Normal breath sounds.  Neurological:     Mental Status: He is alert.      Impression and Plan:  Congestion of nasal sinus -     POCT Influenza A/B -     POC COVID-19 BinaxNow  Sinusitis, unspecified chronicity, unspecified location -     POCT Influenza A/B -     POC COVID-19 BinaxNow  Upper respiratory tract infection, unspecified type   -In office flu and COVID test are negative. -Given exam findings, PNA, pharyngitis, ear infection are not likely, hence abx have not been prescribed. -Have advised rest, fluids, OTC antihistamines, cough suppressants and mucinex. -RTC if no improvement in 10-14 days.   Time spent:22 minutes reviewing chart, interviewing and examining patient and formulating plan of care.     Chaya Jan, MD [facility] at [facility]

## 2023-03-19 ENCOUNTER — Encounter: Payer: Self-pay | Admitting: Orthopaedic Surgery

## 2023-03-19 ENCOUNTER — Ambulatory Visit: Payer: Medicare Other | Admitting: Orthopaedic Surgery

## 2023-03-19 DIAGNOSIS — S76312A Strain of muscle, fascia and tendon of the posterior muscle group at thigh level, left thigh, initial encounter: Secondary | ICD-10-CM | POA: Diagnosis not present

## 2023-03-19 MED ORDER — DICLOFENAC SODIUM 75 MG PO TBEC: 75.0000 mg | DELAYED_RELEASE_TABLET | Freq: Two times a day (BID) | ORAL | 3 refills | Status: AC | PRN

## 2023-03-19 NOTE — Progress Notes (Signed)
The patient is an 82 year old gentleman well-known to [place].  He comes in for follow-up as a relates to an insertional hamstring tear on his left side.  He had to unfortunately have a cruise canceled due to the pain he was having in this area and how was affecting his mobility.  He is feeling better now.  He had 1 physical therapy visit with dry needling.  He would like to have a regular anti-inflammatory to try because Celebrex has not really helped.  We did talk in length in detail about trying Voltaren gel especially for his hands and his trochanteric area.  He is a patient of the [place] and he says something has been ordered for him in terms of topical anti-inflammatory.  On exam his left hip moves smoothly and fluidly.  There is pain over the insertion of the hamstring but he does have good strength in his hip.  I will send in diclofenac and I think this will be good oral anti-inflammatory for him.  It is good that he is feeling better and should continue to improve slowly with time.  If he does have any issues he knows to reach out to [place] and let us know.  Follow-up is as needed.

## 2023-04-14 ENCOUNTER — Telehealth: Payer: Self-pay | Admitting: Cardiovascular Disease

## 2023-04-14 DIAGNOSIS — I10 Essential (primary) hypertension: Secondary | ICD-10-CM

## 2023-04-14 DIAGNOSIS — E782 Mixed hyperlipidemia: Secondary | ICD-10-CM

## 2023-04-14 NOTE — Telephone Encounter (Signed)
Patient said that he would like to have lab work done before appt on 3/28

## 2023-04-14 NOTE — Telephone Encounter (Signed)
Returned call to patient and explained new lab process. Orders entered for lipids, Alt, BMET and released so he can come at his convenience.

## 2023-04-22 ENCOUNTER — Encounter (INDEPENDENT_AMBULATORY_CARE_PROVIDER_SITE_OTHER): Payer: Medicare Other | Admitting: Family Medicine

## 2023-04-22 ENCOUNTER — Encounter: Payer: Self-pay | Admitting: Family Medicine

## 2023-04-22 NOTE — Progress Notes (Signed)
 error

## 2023-04-30 DIAGNOSIS — Z961 Presence of intraocular lens: Secondary | ICD-10-CM | POA: Diagnosis not present

## 2023-04-30 DIAGNOSIS — H524 Presbyopia: Secondary | ICD-10-CM | POA: Diagnosis not present

## 2023-07-07 ENCOUNTER — Encounter: Payer: Self-pay | Admitting: Cardiovascular Disease

## 2023-07-07 NOTE — Progress Notes (Unsigned)
 CARDIOLOGY OFFICE NOTE  Date:  07/11/2023    Carlos Juarez Date of Birth: 1941/01/24 Medical Record #[id number]  PCP:  Yanixan Mellinger Aspen, Carlos Patricia, MD  Cardiologist:  Former patient of Dr. Yevonne Pax  , now [name]   Chief Complaint  Patient presents with   Hypertension        Hyperlipidemia   Problem list 1. Essential hypertension 2. Hyperlipidemia 3. Hypothyroidism   Notes from Norma Fredrickson.  Carlos Juarez is a 83 y.o. male who presents today for a 4 month check. Former patient of Dr. Patty Sermons.   He has a history of essential hypertension and a history of hypercholesterolemia. He does not have any history of ischemic heart disease. He had a normal treadmill Cardiolite stress test on 07/24/05 which showed no ischemia and his ejection fraction was 57% with no wall motion abnormalities. He also has a history of hypothyroid disease.   Last seen back in November - cardiac status was felt to be Juarez. Had had a fall.   Comes back today. Here alone. Remains on NSAID. BP high here today but says he has good control at home. Asking for his Xanax refill - would defer to PCP. He had his labs done earlier this month - those are reviewed. Blood sugar creeping up. He says he is walking most days but knows he needs to lose weight. No chest pain. Breathing is good.   Sept. 21, 2017:  Carlos Juarez is seen today for the first time.  Transfer from [place] .  BP is normal at home.   Is elevated here.  No CP or dyspnea  Works out with a Psychologist, educational.  Retired Child psychotherapist.  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   July 03, 2016   Doing well from a cardiac standpoint. Had carpal tunnel surgery on his right hand several weeks ago. Seems to be healing up nicely. His blood pressure and heart rate are well controlled.  He's back working out with his trainer on a regular basis. His blood pressure at home is very well-controlled. His blood pressure here  today in the office is slightly elevated. Weight and chol levels have increased    Jan 14 2017: Mr. Behar is seen today for follow up visit . BP is elevated today here in the office.   BP readings at home look good.  Avoids salt Has started back exercise.   Had back surgery several months ,  Feeling better.  No further hip pain .   Oct. 2, 2019: Doing well from a cardiac standpoint  Has had bilateral knee replacements Carlos Juarez )   Oct. 1, 2020:  Mr [name] is see for follow up of his hypertension and hyperlipidemia. Doing well Exercises regularly  works out with trainer twice a week, rowing machine    Sept. 3, 2021:  [name] is seen today for follow up of his HTN, HLD, Still very active BP is typcially well controlled.   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   Sept. 7, 2022: Carlos Juarez is seen today for follow up of his HTN and HLD  Exercises regularly ,  No CP or dyspnea.   , no syncope  Lipids from last week were reviewed.   Sept. 5, 2023 Carlos Juarez is seen for follow up of his HTN, HLD Has had 2 dizzy spells - c/w orthostasis When he  goes from sitting to standing .  Will reduce his atenolol from 50 QD to 25  QD Will see him in 6 months,  will consider stopping atenolol at that time   July 02, 2022: Carlos Juarez is  seen today for follow-up visit.  He has a history of hypertension and hyperlipidemia.  He had some bradycardia.  We reduced his atenolol from 50 mg to 25 mg during his last visit.  Will consider stopping it completely if he continues to be bradycardic. His HR is typically in the 50-60   Feels well  Has but on a few pounds - has been eating more desserts now that they have moved to [facility] [address] [facility] [place]  No CP or dyspnea,  still exercising  HR appears Juarez   July 11, 2023 Unknown is seen for follow up of his HTN , HLD  Has gained some weight  Up 16  lbs in a year  Is getting some exercise, not as much as previously  Has not been waling   His LDL on July 08, 2023 was 51   Past Medical History:  Diagnosis Date   Arthritis    Cancer (HCC)    skin cancer removed rom nose non melanoma   HTN (hypertension)    Hypercholesteremia    Hypothyroidism    Pneumonia    PVC's (premature ventricular contractions)    Synovial cyst    Trigger middle finger    right    Past Surgical History:  Procedure Laterality Date   CARPAL TUNNEL RELEASE     HERNIA REPAIR     as a child r inguinal   JOINT REPLACEMENT     TKA Dr. Magnus Juarez 03/28/17   LUMBAR LAMINECTOMY/DECOMPRESSION MICRODISCECTOMY N/A 12/25/2016   Procedure: Laminectomy and Foraminotomy - Lumbar two-Lumbar three - Lumbar three-Lumbar four, resection of synovial cyst;  Surgeon: Tia Alert, MD;  Location: [facility] OR;  Service: Neurosurgery;  Laterality: N/A;   MENISCUS REPAIR Left    STERIOD INJECTION Right 10/07/2017   Procedure: RIGHT MIDDLE TRIGGER FINGER INJECTION;  Surgeon: Kathryne Hitch, MD;  Location: [facility] OR;  Service: Orthopedics;  Laterality: Right;   synovial cyst removed     12/25/16   TONSILLECTOMY     TOTAL KNEE ARTHROPLASTY Left 03/28/2017   Procedure: LEFT TOTAL KNEE ARTHROPLASTY;  Surgeon: Kathryne Hitch, MD;  Location: [facility] ORS;  Service: Orthopedics;  Laterality: Left;   TOTAL KNEE ARTHROPLASTY Right 10/07/2017   Procedure: RIGHT TOTAL KNEE ARTHROPLASTY;  Surgeon: Kathryne Hitch, MD;  Location: [facility] OR;  Service: Orthopedics;  Laterality: Right;     Medications: Current Outpatient Medications  Medication Sig Dispense Refill   atenolol (TENORMIN) 25 MG tablet Take 1 tablet (25 mg total) by mouth daily. 90 tablet 3   ezetimibe (ZETIA) 10 MG tablet Take 1 tablet (10 mg total) by mouth daily. 90 tablet 3   levothyroxine (SYNTHROID) 125 MCG tablet Take 1 tablet (125 mcg total) by mouth daily. 90 tablet 1   rosuvastatin (CRESTOR) 10 MG tablet Take 1  tablet (10 mg total) by mouth daily. 90 tablet 3   diclofenac (VOLTAREN) 75 MG EC tablet Take 1 tablet (75 mg total) by mouth 2 (two) times daily between meals as needed. (Patient not taking: Reported on 07/11/2023) 60 tablet 3   methocarbamol (ROBAXIN) 500 MG tablet Take 1 tablet (500 mg total) by mouth every 8 (eight) hours as needed. (Patient not taking:  Reported on 07/11/2023) 40 tablet 1   traMADol (ULTRAM) 50 MG tablet Take 1 tablet (50 mg total) by mouth every 6 (six) hours as needed. (Patient not taking: Reported on 07/11/2023) 30 tablet 0   No current facility-administered medications for this visit.    Allergies: Allergies  Allergen Reactions   Epinephrine Other (See Comments)    Light headedness   Other Anxiety    Other reaction(s): Anxiety, OTHER REACTION    Social History: The patient  reports that he quit smoking about 49 years ago. His smoking use included cigarettes. He started smoking about 64 years ago. He has a 15 pack-year smoking history. He has never used smokeless tobacco. He reports current alcohol use. He reports that he does not use drugs.   Family History: The patient's family history includes Heart attack (age of onset: 74) in his father; Heart disease in his father; Hypertension in his mother.   Review of Systems: Please see the history of present illness.   Otherwise, the review of systems is positive for none.   All other systems are reviewed and negative.    Physical Exam: Blood pressure 128/86, pulse 61, height 5\' 11"  (1.803 m), weight 216 lb 12.8 oz (98.3 kg), SpO2 96%.       GEN:  Well nourished, well developed in no acute distress HEENT: Normal NECK: No JVD; No carotid bruits LYMPHATICS: No lymphadenopathy CARDIAC: RRR , no murmurs, rubs, gallops RESPIRATORY:  Clear to auscultation without rales, wheezing or rhonchi  ABDOMEN: Soft, non-tender, non-distended MUSCULOSKELETAL:  No edema; No deformity  SKIN: Warm and dry NEUROLOGIC:  Alert and  oriented x 3      LABORATORY DATA:  EKG:    EKG Interpretation Date/Time:  Friday July 11 2023 11:22:16 EDT Ventricular Rate:  57 PR Interval:  154 QRS Duration:  106 QT Interval:  424 QTC Calculation: 412 R Axis:   40  Text Interpretation: Sinus bradycardia No previous ECGs available Confirmed by Kristeen Miss ([id number]) on 07/11/2023 11:35:04 AM       Lab Results  Component Value Date   WBC 9.8 10/08/2017   HGB 13.8 10/08/2017   HCT 41.2 10/08/2017   PLT 174 10/08/2017   GLUCOSE 126 (H) 07/08/2023   CHOL 163 07/08/2023   TRIG 98 07/08/2023   HDL 71 07/08/2023   LDLDIRECT 103.7 01/03/2012   LDLCALC 74 07/08/2023   ALT 30 07/08/2023   AST 24 12/13/2021   NA 143 07/08/2023   K 4.2 07/08/2023   CL 103 07/08/2023   CREATININE 1.09 07/08/2023   BUN 18 07/08/2023   CO2 27 07/08/2023   TSH 0.55 10/24/2020   INR 0.98 12/25/2016    BNP (last 3 results) No results for input(s): "BNP" in the last 8760 hours.  ProBNP (last 3 results) No results for input(s): "PROBNP" in the last 8760 hours.   Other Studies Reviewed Today:   Assessment/Plan: 1.  Essential Hypertension:  BP is well controlled. .  Cont current meds       2. Hypercholesterolemia -   his last LDL was 74.   Cont meds, continue to work on better diet, weight loss      4. Hypothyroidism -      Orders Placed This Encounter  Procedures   EKG 12-Lead     Kristeen Miss, MD  07/11/2023 11:29 AM    [facility] [address] [place],  [address] [place], [place]  [address] Pager [phone] Phone: [phone])  [phone]; Fax: [phone]

## 2023-07-08 DIAGNOSIS — I10 Essential (primary) hypertension: Secondary | ICD-10-CM | POA: Diagnosis not present

## 2023-07-08 DIAGNOSIS — E782 Mixed hyperlipidemia: Secondary | ICD-10-CM | POA: Diagnosis not present

## 2023-07-08 LAB — BASIC METABOLIC PANEL
BUN/Creatinine Ratio: 17 (ref 10–24)
BUN: 18 mg/dL (ref 8–27)
CO2: 27 mmol/L (ref 20–29)
Calcium: 9.2 mg/dL (ref 8.6–10.2)
Chloride: 103 mmol/L (ref 96–106)
Creatinine, Ser: 1.09 mg/dL (ref 0.76–1.27)
Glucose: 126 mg/dL — ABNORMAL HIGH (ref 70–99)
Potassium: 4.2 mmol/L (ref 3.5–5.2)
Sodium: 143 mmol/L (ref 134–144)
eGFR: 68 mL/min/{1.73_m2} (ref >59)

## 2023-07-08 LAB — LIPID PANEL
Chol/HDL Ratio: 2.3 ratio (ref 0.0–5.0)
Cholesterol, Total: 163 mg/dL (ref 100–199)
HDL: 71 mg/dL (ref >39)
LDL Chol Calc (NIH): 74 mg/dL (ref 0–99)
Triglycerides: 98 mg/dL (ref 0–149)
VLDL Cholesterol Cal: 18 mg/dL (ref 5–40)

## 2023-07-08 LAB — ALT: ALT: 30 IU/L (ref 0–44)

## 2023-07-10 ENCOUNTER — Encounter: Payer: Self-pay | Admitting: Cardiovascular Disease

## 2023-07-11 ENCOUNTER — Encounter: Payer: Self-pay | Admitting: Cardiovascular Disease

## 2023-07-11 ENCOUNTER — Ambulatory Visit: Payer: Medicare Other | Attending: Cardiovascular Disease | Admitting: Cardiovascular Disease

## 2023-07-11 VITALS — BP 128/86 | HR 61 | Ht 71.0 in | Wt 216.8 lb

## 2023-07-11 DIAGNOSIS — E782 Mixed hyperlipidemia: Secondary | ICD-10-CM | POA: Insufficient documentation

## 2023-07-11 DIAGNOSIS — I10 Essential (primary) hypertension: Secondary | ICD-10-CM | POA: Diagnosis not present

## 2023-07-11 DIAGNOSIS — I2510 Atherosclerotic heart disease of native coronary artery without angina pectoris: Secondary | ICD-10-CM | POA: Diagnosis not present

## 2023-07-11 NOTE — Patient Instructions (Signed)
 Follow-Up: At [facility], you and your health needs are our priority.  As part of our continuing mission to provide you with exceptional heart care, our providers are all part of one team.  This team includes your primary Cardiologist (physician) and Advanced Practice Providers or APPs (Physician Assistants and Nurse Practitioners) who all work together to provide you with the care you need, when you need it.  Your next appointment:   1 year(s)  Provider:   Kristeen Miss, MD       1st Floor: - Lobby - Registration  - Pharmacy  - Lab - Cafe  2nd Floor: - PV Lab - Diagnostic Testing (echo, CT, nuclear med)  3rd Floor: - Vacant  4th Floor: - TCTS (cardiothoracic surgery) - AFib Clinic - Structural Heart Clinic - Vascular Surgery  - Vascular Ultrasound  5th Floor: - HeartCare Cardiology (general and EP) - Clinical Pharmacy for coumadin, hypertension, lipid, weight-loss medications, and med management appointments    Valet parking services will be available as well.        1st Floor: - Lobby - Registration  - Pharmacy  - Lab - Cafe  2nd Floor: - PV Lab - Diagnostic Testing (echo, CT, nuclear med)  3rd Floor: - Vacant  4th Floor: - TCTS (cardiothoracic surgery) - AFib Clinic - Structural Heart Clinic - Vascular Surgery  - Vascular Ultrasound  5th Floor: - HeartCare Cardiology (general and EP) - Clinical Pharmacy for coumadin, hypertension, lipid, weight-loss medications, and med management appointments    Valet parking services will be available as well.

## 2023-07-17 ENCOUNTER — Other Ambulatory Visit (INDEPENDENT_AMBULATORY_CARE_PROVIDER_SITE_OTHER): Payer: Self-pay

## 2023-07-17 ENCOUNTER — Ambulatory Visit (INDEPENDENT_AMBULATORY_CARE_PROVIDER_SITE_OTHER): Admitting: Orthopaedic Surgery

## 2023-07-17 DIAGNOSIS — G8929 Other chronic pain: Secondary | ICD-10-CM

## 2023-07-17 DIAGNOSIS — M5442 Lumbago with sciatica, left side: Secondary | ICD-10-CM | POA: Diagnosis not present

## 2023-07-17 NOTE — Progress Notes (Signed)
 The patient is an 83 year old gentleman who is a longtime patient of ours.  We replaced both of his knees.  He comes in today with a several month history of worsening left-sided low back pain and sciatica.  He says he is really been dealing with this for about 5 years now.  He actually had lumbar spine surgery back in September 2018 by Dr. Marikay Alar here in town.  It looks like he had laminectomies with foraminotomies at L2-L3 and L3-L4 with resection of a synovial cyst.  There was no instrumentation and he had done well for a very long period of time.  He denies any weakness in his legs any numbness and tingling in his feet.  He does report a little bit of left-sided thigh pain but most of his pain is in sciatic region on the left side.  He denies any groin pain.  He walks with a normal-appearing gait.  Both his left and right hip moves smoothly and fluidly.  When I have him lay on his right side of his left side up there is no pain over the trochanteric area of the left hip or the IT band.  All of his pain seems to be in the sciatic region and into the left side of his lower lumbar spine.  He has a negative straight leg raise bilaterally and excellent strength and normal sensation in both of his lower extremities.  X-rays of the lumbar spine today are obtained and compared to x-rays from back in 2018.  It can be seen where he had surgery likely at L2-L3 and the next level down given significant degenerative changes now compared to then.  He said that he can contact the neurosurgeons with Dr. Yetta Barre and get a follow-up appoint with them given his slowly worsening left-sided sciatica.  He says he does have medications at home in terms of anti-inflammatories as well as pain medications and muscle relaxants that he needs those.  I let him know we can facilitate any type of follow-up with Dr. Yetta Barre if needed.  All questions and concerns were addressed and answered.

## 2023-07-22 ENCOUNTER — Telehealth: Payer: Self-pay | Admitting: Orthopaedic Surgery

## 2023-07-22 ENCOUNTER — Other Ambulatory Visit: Payer: Self-pay | Admitting: Orthopaedic Surgery

## 2023-07-22 MED ORDER — TRAMADOL HCL 50 MG PO TABS: 50.0000 mg | ORAL_TABLET | Freq: Four times a day (QID) | ORAL | 0 refills | Status: AC | PRN

## 2023-07-22 NOTE — Telephone Encounter (Signed)
 Pt requesting medication refill of Tramadol 50mg  to [facility] on [address], [place]

## 2023-08-12 DIAGNOSIS — M48062 Spinal stenosis, lumbar region with neurogenic claudication: Secondary | ICD-10-CM | POA: Diagnosis not present

## 2023-09-11 DIAGNOSIS — M48062 Spinal stenosis, lumbar region with neurogenic claudication: Secondary | ICD-10-CM | POA: Diagnosis not present

## 2023-09-11 DIAGNOSIS — M48061 Spinal stenosis, lumbar region without neurogenic claudication: Secondary | ICD-10-CM | POA: Diagnosis not present

## 2023-09-11 DIAGNOSIS — M47816 Spondylosis without myelopathy or radiculopathy, lumbar region: Secondary | ICD-10-CM | POA: Diagnosis not present

## 2023-09-11 DIAGNOSIS — M5126 Other intervertebral disc displacement, lumbar region: Secondary | ICD-10-CM | POA: Diagnosis not present

## 2023-09-11 DIAGNOSIS — M4807 Spinal stenosis, lumbosacral region: Secondary | ICD-10-CM | POA: Diagnosis not present

## 2023-09-25 DIAGNOSIS — M48062 Spinal stenosis, lumbar region with neurogenic claudication: Secondary | ICD-10-CM | POA: Diagnosis not present

## 2023-09-25 DIAGNOSIS — Z6830 Body mass index (BMI) 30.0-30.9, adult: Secondary | ICD-10-CM | POA: Diagnosis not present

## 2023-11-04 DIAGNOSIS — D225 Melanocytic nevi of trunk: Secondary | ICD-10-CM | POA: Diagnosis not present

## 2023-11-04 DIAGNOSIS — D2262 Melanocytic nevi of left upper limb, including shoulder: Secondary | ICD-10-CM | POA: Diagnosis not present

## 2023-11-04 DIAGNOSIS — D2261 Melanocytic nevi of right upper limb, including shoulder: Secondary | ICD-10-CM | POA: Diagnosis not present

## 2023-11-04 DIAGNOSIS — L720 Epidermal cyst: Secondary | ICD-10-CM | POA: Diagnosis not present

## 2023-11-04 DIAGNOSIS — L821 Other seborrheic keratosis: Secondary | ICD-10-CM | POA: Diagnosis not present

## 2023-11-04 DIAGNOSIS — Z85828 Personal history of other malignant neoplasm of skin: Secondary | ICD-10-CM | POA: Diagnosis not present

## 2023-11-04 DIAGNOSIS — L905 Scar conditions and fibrosis of skin: Secondary | ICD-10-CM | POA: Diagnosis not present

## 2023-11-06 DIAGNOSIS — Z6830 Body mass index (BMI) 30.0-30.9, adult: Secondary | ICD-10-CM | POA: Diagnosis not present

## 2023-11-06 DIAGNOSIS — M5416 Radiculopathy, lumbar region: Secondary | ICD-10-CM | POA: Diagnosis not present

## 2023-11-24 ENCOUNTER — Other Ambulatory Visit: Payer: Self-pay | Admitting: *Deleted

## 2023-11-24 DIAGNOSIS — E7800 Pure hypercholesterolemia, unspecified: Secondary | ICD-10-CM

## 2023-11-24 DIAGNOSIS — I10 Essential (primary) hypertension: Secondary | ICD-10-CM

## 2023-11-24 DIAGNOSIS — I951 Orthostatic hypotension: Secondary | ICD-10-CM

## 2023-11-24 MED ORDER — EZETIMIBE 10 MG PO TABS: 10.0000 mg | ORAL_TABLET | Freq: Every day | ORAL | 2 refills | Status: AC

## 2023-11-24 MED ORDER — ROSUVASTATIN CALCIUM 10 MG PO TABS: 10.0000 mg | ORAL_TABLET | Freq: Every day | ORAL | 2 refills | Status: AC

## 2023-12-08 DIAGNOSIS — M48062 Spinal stenosis, lumbar region with neurogenic claudication: Secondary | ICD-10-CM | POA: Diagnosis not present

## 2023-12-08 DIAGNOSIS — M5416 Radiculopathy, lumbar region: Secondary | ICD-10-CM | POA: Diagnosis not present

## 2023-12-30 DIAGNOSIS — Z23 Encounter for immunization: Secondary | ICD-10-CM | POA: Diagnosis not present

## 2024-01-28 DIAGNOSIS — M5416 Radiculopathy, lumbar region: Secondary | ICD-10-CM | POA: Diagnosis not present

## 2024-02-10 DIAGNOSIS — R2689 Other abnormalities of gait and mobility: Secondary | ICD-10-CM | POA: Diagnosis not present

## 2024-02-10 DIAGNOSIS — R293 Abnormal posture: Secondary | ICD-10-CM | POA: Diagnosis not present

## 2024-02-10 DIAGNOSIS — M5416 Radiculopathy, lumbar region: Secondary | ICD-10-CM | POA: Diagnosis not present

## 2024-02-16 ENCOUNTER — Encounter: Payer: Self-pay | Admitting: Radiology

## 2024-02-18 ENCOUNTER — Encounter: Admitting: Internal Medicine

## 2024-02-18 ENCOUNTER — Encounter: Payer: Self-pay | Admitting: Internal Medicine

## 2024-02-18 ENCOUNTER — Non-Acute Institutional Stay: Payer: Self-pay | Admitting: Internal Medicine

## 2024-02-18 VITALS — BP 128/76 | HR 55 | Temp 98.0°F | Ht 71.0 in | Wt 225.6 lb

## 2024-02-18 DIAGNOSIS — E039 Hypothyroidism, unspecified: Secondary | ICD-10-CM | POA: Diagnosis not present

## 2024-02-18 DIAGNOSIS — E7800 Pure hypercholesterolemia, unspecified: Secondary | ICD-10-CM

## 2024-02-18 DIAGNOSIS — R739 Hyperglycemia, unspecified: Secondary | ICD-10-CM | POA: Diagnosis not present

## 2024-02-18 DIAGNOSIS — I10 Essential (primary) hypertension: Secondary | ICD-10-CM | POA: Diagnosis not present

## 2024-02-18 NOTE — Progress Notes (Signed)
 Location:  [facility] Service:  Clinic (12)  Provider:   Code Status:  Goals of Care:     10/21/2017   11:59 AM  Advanced Directives  Does Patient Have a Medical Advance Directive? Yes   Type of Estate Agent of [place];Living will  Copy of Healthcare Power of Attorney in Chart? No - copy requested   Would patient like information on creating a medical advance directive? No - Patient declined      Data saved with a previous flowsheet row definition     Chief Complaint  Patient presents with   Establish Care    Discuss weight management.     HPI: Patient is a 83 y.o. male seen today for medical management of chronic diseases.    Discussed the use of AI scribe software for clinical note transcription with the patient, who gave verbal consent to proceed.  History of Present Illness   Carlos Juarez is an 83 year old male who presents for a routine follow-up visit. Weight Gain He has experienced significant weight gain since moving to [facility], despite regular exercise. He attributes this to his enjoyment of food, particularly ice cream. His son has suggested considering medication for weight loss. His blood sugar has been elevated in the past, but he has not been diagnosed with diabetes.  S/p Low back surgery By Dr Joshua few months  He underwent lower back surgery one month ago for pain radiating down his leg, which has been alleviated. Recovery is slow, and he is working with a physical therapist and a systems analyst. He no longer uses tramadol  for pain.  HTN He takes atenolol  for blood pressure,  HLD  two medications for cholesterol, and Synthroid  for thyroid  management. He receives medications from the [facility] and [facility]. He has no issues with bowel movements and occasionally urinates at night.  He resides in a retirement community and has lived in [place] for over twenty years. He has received COVID, flu, and  RSV vaccinations.      Past Medical History:  Diagnosis Date   Arthritis    Cancer (HCC)    skin cancer removed rom nose non melanoma   HTN (hypertension)    Hypercholesteremia    Hypothyroidism    Pneumonia    PVC's (premature ventricular contractions)    Synovial cyst    Trigger middle finger    right    Past Surgical History:  Procedure Laterality Date   CARPAL TUNNEL RELEASE     HERNIA REPAIR     as a child r inguinal   JOINT REPLACEMENT     TKA Dr. Vernetta 03/28/17   LUMBAR LAMINECTOMY/DECOMPRESSION MICRODISCECTOMY N/A 12/25/2016   Procedure: Laminectomy and Foraminotomy - Lumbar two-Lumbar three - Lumbar three-Lumbar four, resection of synovial cyst;  Surgeon: Joshua Alm RAMAN, MD;  Location: [facility] OR;  Service: Neurosurgery;  Laterality: N/A;   MENISCUS REPAIR Left    STERIOD INJECTION Right 10/07/2017   Procedure: RIGHT MIDDLE TRIGGER FINGER INJECTION;  Surgeon: Vernetta Lonni GRADE, MD;  Location: [facility] OR;  Service: Orthopedics;  Laterality: Right;   synovial cyst removed     12/25/16   TONSILLECTOMY     TOTAL KNEE ARTHROPLASTY Left 03/28/2017   Procedure: LEFT TOTAL KNEE ARTHROPLASTY;  Surgeon: Vernetta Lonni GRADE, MD;  Location: [facility] ORS;  Service: Orthopedics;  Laterality: Left;   TOTAL KNEE ARTHROPLASTY Right 10/07/2017   Procedure: RIGHT TOTAL KNEE ARTHROPLASTY;  Surgeon:  Vernetta Lonni GRADE, MD;  Location: [facility] OR;  Service: Orthopedics;  Laterality: Right;    Allergies  Allergen Reactions   Epinephrine Other (See Comments)    Light headedness   Other Anxiety    Other reaction(s): Anxiety, OTHER REACTION    Outpatient Encounter Medications as of 02/18/2024  Medication Sig   atenolol  (TENORMIN ) 25 MG tablet Take 1 tablet (25 mg total) by mouth daily.   ezetimibe  (ZETIA ) 10 MG tablet Take 1 tablet (10 mg total) by mouth daily.   levothyroxine  (SYNTHROID ) 125 MCG tablet Take 1 tablet (125 mcg total) by mouth daily.   rosuvastatin  (CRESTOR ) 10 MG tablet Take  1 tablet (10 mg total) by mouth daily.   traMADol  (ULTRAM ) 50 MG tablet Take 1 tablet (50 mg total) by mouth every 6 (six) hours as needed.   diclofenac  (VOLTAREN ) 75 MG EC tablet Take 1 tablet (75 mg total) by mouth 2 (two) times daily between meals as needed. (Patient not taking: Reported on 02/18/2024)   methocarbamol  (ROBAXIN ) 500 MG tablet Take 1 tablet (500 mg total) by mouth every 8 (eight) hours as needed. (Patient not taking: Reported on 02/18/2024)   No facility-administered encounter medications on file as of 02/18/2024.    Review of Systems:  Review of Systems  Constitutional:  Negative for activity change, appetite change and unexpected weight change.  HENT: Negative.    Respiratory:  Negative for cough and shortness of breath.   Cardiovascular:  Negative for leg swelling.  Gastrointestinal:  Negative for constipation.  Genitourinary:  Negative for frequency.  Musculoskeletal:  Negative for arthralgias, gait problem and myalgias.  Skin: Negative.  Negative for rash.  Neurological:  Negative for dizziness and weakness.  Psychiatric/Behavioral:  Negative for confusion and sleep disturbance.   All other systems reviewed and are negative.   Health Maintenance  Topic Date Due   Medicare Annual Wellness (AWV)  09/18/2017   Pneumococcal Vaccine: 50+ Years (2 of 2 - PPSV23, PCV20, or PCV21) 04/21/2024 (Originally 03/31/2013)   COVID-19 Vaccine (9 - 2025-26 season) 06/28/2024   DTaP/Tdap/Td (2 - Td or Tdap) 02/11/2027   Influenza Vaccine  Completed   Zoster Vaccines- Shingrix  Completed   Meningococcal B Vaccine  Aged Out    Physical Exam: Vitals:   02/18/24 0959  BP: 128/76  Pulse: (!) 55  Temp: 98 F (36.7 C)  SpO2: 96%  Weight: 225 lb 9.6 oz (102.3 kg)  Height: 5' 11 (1.803 m)   Body mass index is 31.46 kg/m. Physical Exam Vitals reviewed.  Constitutional:      Appearance: Normal appearance.  HENT:     Head: Normocephalic.     Right Ear: Tympanic membrane  normal.     Left Ear: Tympanic membrane normal.     Nose: Nose normal.     Mouth/Throat:     Mouth: Mucous membranes are moist.     Pharynx: Oropharynx is clear.  Eyes:     Pupils: Pupils are equal, round, and reactive to light.  Cardiovascular:     Rate and Rhythm: Normal rate and regular rhythm.     Pulses: Normal pulses.     Heart sounds: No murmur heard. Pulmonary:     Effort: Pulmonary effort is normal. No respiratory distress.     Breath sounds: Normal breath sounds. No rales.  Abdominal:     General: Abdomen is flat. Bowel sounds are normal.     Palpations: Abdomen is soft.  Musculoskeletal:  General: No swelling.     Cervical back: Neck supple.  Skin:    General: Skin is warm.  Neurological:     General: No focal deficit present.     Mental Status: He is alert and oriented to person, place, and time.  Psychiatric:        Mood and Affect: Mood normal.        Thought Content: Thought content normal.     Labs reviewed: Basic Metabolic Panel: Recent Labs    07/08/23 0935  NA 143  K 4.2  CL 103  CO2 27  GLUCOSE 126*  BUN 18  CREATININE 1.09  CALCIUM  9.2   Liver Function Tests: Recent Labs    07/08/23 0935  ALT 30   No results for input(s): LIPASE, AMYLASE in the last 8760 hours. No results for input(s): AMMONIA in the last 8760 hours. CBC: No results for input(s): WBC, NEUTROABS, HGB, HCT, MCV, PLT in the last 8760 hours. Lipid Panel: Recent Labs    07/08/23 0935  CHOL 163  HDL 71  LDLCALC 74  TRIG 98  CHOLHDL 2.3   No results found for: HGBA1C  Procedures since last visit: No results found.  Assessment/Plan 1. Primary hypertension (Primary) Atenolol  - COMPLETE METABOLIC PANEL WITHOUT GFR - CBC with Differential/Platelet  2. Acquired hypothyroidism  - TSH  3. Hypercholesteremia Zetia  and statin - Lipid panel  4. Hyperglycemia  - Hemoglobin A1c  5 Weight gain Discussed pros and cons of Using GLP1 At  this time will wait  6 Hyperglycemia Check A1C   Labs/tests ordered:  * No order type specified * Next appt:  Visit date not found

## 2024-02-18 NOTE — Patient Instructions (Addendum)
 Lab Visit 02/26/2024 at 8am at [facility].

## 2024-02-20 DIAGNOSIS — R293 Abnormal posture: Secondary | ICD-10-CM | POA: Diagnosis not present

## 2024-02-20 DIAGNOSIS — R2689 Other abnormalities of gait and mobility: Secondary | ICD-10-CM | POA: Diagnosis not present

## 2024-02-20 DIAGNOSIS — M5416 Radiculopathy, lumbar region: Secondary | ICD-10-CM | POA: Diagnosis not present

## 2024-02-23 DIAGNOSIS — R293 Abnormal posture: Secondary | ICD-10-CM | POA: Diagnosis not present

## 2024-02-23 DIAGNOSIS — M5416 Radiculopathy, lumbar region: Secondary | ICD-10-CM | POA: Diagnosis not present

## 2024-02-23 DIAGNOSIS — R2689 Other abnormalities of gait and mobility: Secondary | ICD-10-CM | POA: Diagnosis not present

## 2024-02-26 DIAGNOSIS — E039 Hypothyroidism, unspecified: Secondary | ICD-10-CM | POA: Diagnosis not present

## 2024-02-26 DIAGNOSIS — E7800 Pure hypercholesterolemia, unspecified: Secondary | ICD-10-CM | POA: Diagnosis not present

## 2024-02-26 DIAGNOSIS — R739 Hyperglycemia, unspecified: Secondary | ICD-10-CM | POA: Diagnosis not present

## 2024-02-26 DIAGNOSIS — I10 Essential (primary) hypertension: Secondary | ICD-10-CM | POA: Diagnosis not present

## 2024-02-27 ENCOUNTER — Ambulatory Visit: Payer: Self-pay | Admitting: Nurse Practitioner

## 2024-02-27 LAB — CBC WITH DIFFERENTIAL/PLATELET
Absolute Lymphocytes: 2137 {cells}/uL (ref 850–3900)
Absolute Monocytes: 523 {cells}/uL (ref 200–950)
Basophils Absolute: 13 {cells}/uL (ref 0–200)
Basophils Relative: 0.2 %
Eosinophils Absolute: 101 {cells}/uL (ref 15–500)
Eosinophils Relative: 1.5 %
HCT: 50.7 % — ABNORMAL HIGH (ref 38.5–50.0)
Hemoglobin: 17.5 g/dL — ABNORMAL HIGH (ref 13.2–17.1)
MCH: 31.8 pg (ref 27.0–33.0)
MCHC: 34.5 g/dL (ref 32.0–36.0)
MCV: 92.2 fL (ref 80.0–100.0)
MPV: 11.3 fL (ref 7.5–12.5)
Monocytes Relative: 7.8 %
Neutro Abs: 3926 {cells}/uL (ref 1500–7800)
Neutrophils Relative %: 58.6 %
Platelets: 236 Thousand/uL (ref 140–400)
RBC: 5.5 Million/uL (ref 4.20–5.80)
RDW: 12.2 % (ref 11.0–15.0)
Total Lymphocyte: 31.9 %
WBC: 6.7 Thousand/uL (ref 3.8–10.8)

## 2024-02-27 LAB — COMPLETE METABOLIC PANEL WITHOUT GFR
AG Ratio: 1.6 (calc) (ref 1.0–2.5)
ALT: 22 U/L (ref 9–46)
AST: 22 U/L (ref 10–35)
Albumin: 3.7 g/dL (ref 3.6–5.1)
Alkaline phosphatase (APISO): 57 U/L (ref 35–144)
BUN: 19 mg/dL (ref 7–25)
CO2: 27 mmol/L (ref 20–32)
Calcium: 9 mg/dL (ref 8.6–10.3)
Chloride: 105 mmol/L (ref 98–110)
Creat: 1.05 mg/dL (ref 0.70–1.22)
Globulin: 2.3 g/dL (ref 1.9–3.7)
Glucose, Bld: 145 mg/dL — ABNORMAL HIGH (ref 65–99)
Potassium: 4.1 mmol/L (ref 3.5–5.3)
Sodium: 141 mmol/L (ref 135–146)
Total Bilirubin: 1.3 mg/dL — ABNORMAL HIGH (ref 0.2–1.2)
Total Protein: 6 g/dL — ABNORMAL LOW (ref 6.1–8.1)

## 2024-02-27 LAB — HEMOGLOBIN A1C
Hgb A1c MFr Bld: 6.4 % — ABNORMAL HIGH (ref <5.7)
Mean Plasma Glucose: 137 mg/dL
eAG (mmol/L): 7.6 mmol/L

## 2024-02-27 LAB — LIPID PANEL
Cholesterol: 172 mg/dL (ref <200)
HDL: 77 mg/dL (ref >=40)
LDL Cholesterol (Calc): 74 mg/dL
Non-HDL Cholesterol (Calc): 95 mg/dL (ref <130)
Total CHOL/HDL Ratio: 2.2 (calc) (ref <5.0)
Triglycerides: 126 mg/dL (ref <150)

## 2024-02-27 LAB — TSH: TSH: 1.89 m[IU]/L (ref 0.40–4.50)

## 2024-03-03 DIAGNOSIS — R293 Abnormal posture: Secondary | ICD-10-CM | POA: Diagnosis not present

## 2024-03-03 DIAGNOSIS — M5416 Radiculopathy, lumbar region: Secondary | ICD-10-CM | POA: Diagnosis not present

## 2024-03-03 DIAGNOSIS — R2689 Other abnormalities of gait and mobility: Secondary | ICD-10-CM | POA: Diagnosis not present

## 2024-03-10 DIAGNOSIS — R2689 Other abnormalities of gait and mobility: Secondary | ICD-10-CM | POA: Diagnosis not present

## 2024-03-10 DIAGNOSIS — M5416 Radiculopathy, lumbar region: Secondary | ICD-10-CM | POA: Diagnosis not present

## 2024-03-10 DIAGNOSIS — R293 Abnormal posture: Secondary | ICD-10-CM | POA: Diagnosis not present

## 2024-05-05 ENCOUNTER — Non-Acute Institutional Stay: Admitting: Internal Medicine

## 2024-05-05 ENCOUNTER — Encounter: Payer: Self-pay | Admitting: Internal Medicine

## 2024-05-05 VITALS — BP 124/78 | HR 74 | Temp 98.0°F | Resp 18 | Ht 71.0 in | Wt 224.1 lb

## 2024-05-05 DIAGNOSIS — R6889 Other general symptoms and signs: Secondary | ICD-10-CM

## 2024-05-05 DIAGNOSIS — B349 Viral infection, unspecified: Secondary | ICD-10-CM | POA: Diagnosis not present

## 2024-05-05 DIAGNOSIS — Z1152 Encounter for screening for COVID-19: Secondary | ICD-10-CM | POA: Diagnosis not present

## 2024-05-05 LAB — POCT INFLUENZA A/B
Influenza A, POC: NEGATIVE
Influenza B, POC: NEGATIVE

## 2024-05-05 LAB — POC COVID19 BINAXNOW: SARS Coronavirus 2 Ag: NEGATIVE

## 2024-05-05 NOTE — Patient Instructions (Addendum)
 You Have Viral Infection Covid and Flu test were negative  Take Mucinex DM for Cough and Congestion Take Tylenol  For Aches Let us  know if it does not get better in 2 weeks Take rest and Hydrate

## 2024-05-05 NOTE — Progress Notes (Signed)
 "  Location: [facility] Service:  Clinic (12)  Provider:   Code Status:  Goals of Care:     10/21/2017   11:59 AM  Advanced Directives  Does Patient Have a Medical Advance Directive? Yes   Type of Estate Agent of [place];Living will  Copy of Healthcare Power of Attorney in Chart? No - copy requested   Would patient like information on creating a medical advance directive? No - Patient declined      Data saved with a previous flowsheet row definition     Chief Complaint  Patient presents with   Cough    Cough and Congestion     HPI: Patient is a 84 y.o. male seen today for an acute visit for Cough and congestion  Discussed the use of AI scribe software for clinical note transcription with the patient, who gave verbal consent to proceed.  History of Present Illness   Carlos Juarez is an 84 year old male who presents with symptoms of a viral infection.  He has had congestion, mild cough with minimal sputum, and fatigue since Sunday night. He reports poor sleep from congestion and headache around his eyes. He has a mild sore throat and a sensation of ear stuffiness without significant ear pain. He denies fever, chest pain, or shortness of breath. He uses Tylenol  occasionally for symptom relief.       Past Medical History:  Diagnosis Date   Arthritis    Cancer (HCC)    skin cancer removed rom nose non melanoma   HTN (hypertension)    Hypercholesteremia    Hypothyroidism    Pneumonia    PVC's (premature ventricular contractions)    Synovial cyst    Trigger middle finger    right    Past Surgical History:  Procedure Laterality Date   CARPAL TUNNEL RELEASE     HERNIA REPAIR     as a child r inguinal   JOINT REPLACEMENT     TKA Dr. Vernetta 03/28/17   LUMBAR LAMINECTOMY/DECOMPRESSION MICRODISCECTOMY N/A 12/25/2016   Procedure: Laminectomy and Foraminotomy - Lumbar two-Lumbar three - Lumbar three-Lumbar four, resection  of synovial cyst;  Surgeon: Joshua Alm RAMAN, MD;  [facility];  Service: Neurosurgery;  Laterality: N/A;   MENISCUS REPAIR Left    STERIOD INJECTION Right 10/07/2017   Procedure: RIGHT MIDDLE TRIGGER FINGER INJECTION;  Surgeon: Vernetta Lonni GRADE, MD;  Location: [facility] OR;  Service: Orthopedics;  Laterality: Right;   synovial cyst removed     12/25/16   TONSILLECTOMY     TOTAL KNEE ARTHROPLASTY Left 03/28/2017   Procedure: LEFT TOTAL KNEE ARTHROPLASTY;  Surgeon: Vernetta Lonni GRADE, MD;  Location: [facility] ORS;  Service: Orthopedics;  Laterality: Left;   TOTAL KNEE ARTHROPLASTY Right 10/07/2017   Procedure: RIGHT TOTAL KNEE ARTHROPLASTY;  Surgeon: Vernetta Lonni GRADE, MD;  Location: [facility] OR;  Service: Orthopedics;  Laterality: Right;    Allergies[1]  Outpatient Encounter Medications as of 05/05/2024  Medication Sig   atenolol  (TENORMIN ) 25 MG tablet Take 1 tablet (25 mg total) by mouth daily.   diclofenac  (VOLTAREN ) 75 MG EC tablet Take 1 tablet (75 mg total) by mouth 2 (two) times daily between meals as needed.   ezetimibe  (ZETIA ) 10 MG tablet Take 1 tablet (10 mg total) by mouth daily.   levothyroxine  (SYNTHROID ) 125 MCG tablet Take 1 tablet (125 mcg total) by mouth daily.   methocarbamol  (ROBAXIN ) 500 MG tablet Take 1 tablet (  500 mg total) by mouth every 8 (eight) hours as needed.   rosuvastatin  (CRESTOR ) 10 MG tablet Take 1 tablet (10 mg total) by mouth daily.   traMADol  (ULTRAM ) 50 MG tablet Take 1 tablet (50 mg total) by mouth every 6 (six) hours as needed.   No facility-administered encounter medications on file as of 05/05/2024.    Review of Systems:  Review of Systems  Constitutional:  Positive for activity change. Negative for appetite change and unexpected weight change.  HENT:  Positive for ear pain and postnasal drip.   Respiratory:  Positive for cough. Negative for shortness of breath.   Cardiovascular:  Negative for leg swelling.  Gastrointestinal:  Negative for  constipation.  Genitourinary:  Negative for frequency.  Musculoskeletal:  Negative for arthralgias, gait problem and myalgias.  Skin: Negative.  Negative for rash.  Neurological:  Negative for dizziness and weakness.  Psychiatric/Behavioral:  Negative for confusion and sleep disturbance.   All other systems reviewed and are negative.   Health Maintenance  Topic Date Due   Medicare Annual Wellness (AWV)  09/18/2017   COVID-19 Vaccine (9 - 2025-26 season) 07/09/2024   DTaP/Tdap/Td (2 - Td or Tdap) 02/11/2027   Pneumococcal Vaccine: 50+ Years  Completed   Influenza Vaccine  Completed   Zoster Vaccines- Shingrix  Completed   Meningococcal B Vaccine  Aged Out    Physical Exam: Vitals:   05/05/24 1314  BP: 124/78  Pulse: 74  Resp: 18  Temp: 98 F (36.7 C)  SpO2: 96%  Weight: 224 lb 1.6 oz (101.7 kg)  Height: 5' 11 (1.803 m)   Body mass index is 31.26 kg/m. Physical Exam Vitals reviewed.  Constitutional:      Appearance: Normal appearance.  HENT:     Head: Normocephalic.     Right Ear: Tympanic membrane normal.     Left Ear: Tympanic membrane normal.     Nose: Congestion present.     Mouth/Throat:     Mouth: Mucous membranes are moist.     Pharynx: Oropharynx is clear. No oropharyngeal exudate.  Cardiovascular:     Rate and Rhythm: Bradycardia present.  Pulmonary:     Effort: Pulmonary effort is normal. No respiratory distress.     Breath sounds: Normal breath sounds. No wheezing or rales.  Musculoskeletal:        General: No swelling.  Neurological:     General: No focal deficit present.     Mental Status: He is alert and oriented to person, place, and time.     Labs reviewed: Basic Metabolic Panel: Recent Labs    07/08/23 0935 02/26/24 0823  NA 143 141  K 4.2 4.1  CL 103 105  CO2 27 27  GLUCOSE 126* 145*  BUN 18 19  CREATININE 1.09 1.05  CALCIUM  9.2 9.0  TSH  --  1.89   Liver Function Tests: Recent Labs    07/08/23 0935 02/26/24 0823  AST   --  22  ALT 30 22  BILITOT  --  1.3*  PROT  --  6.0*   No results for input(s): LIPASE, AMYLASE in the last 8760 hours. No results for input(s): AMMONIA in the last 8760 hours. CBC: Recent Labs    02/26/24 0823  WBC 6.7  NEUTROABS 3,926  HGB 17.5*  HCT 50.7*  MCV 92.2  PLT 236   Lipid Panel: Recent Labs    07/08/23 0935 02/26/24 0823  CHOL 163 172  HDL 71 77  LDLCALC 74 74  TRIG 98 126  CHOLHDL 2.3 2.2   Lab Results  Component Value Date   HGBA1C 6.4 (H) 02/26/2024    Procedures since last visit: No results found.  Assessment/Plan 1. Encounter for screening for COVID-19 (Primary)  - POC COVID-19 BinaxNow - POCT Influenza A/B  2. Flu-like symptoms  - POC COVID-19 BinaxNow - POCT Influenza A/B  Viral Infection Covid Flu Negative Lungs clear on exam Rest take Tylenol  and Mucinex DM PRN    Labs/tests ordered:  * No order type specified * Next appt:  08/18/2024     [1]  Allergies Allergen Reactions   Epinephrine Other (See Comments)    Light headedness   Other Anxiety    Other reaction(s): Anxiety, OTHER REACTION   "

## 2024-08-18 ENCOUNTER — Encounter: Admitting: Internal Medicine
